# Patient Record
Sex: Female | Born: 1935
Health system: Southern US, Community
[De-identification: ages and names within clinical notes are randomized; demographics above are authoritative.]

## PROBLEM LIST (undated history)

## (undated) DIAGNOSIS — M1990 Unspecified osteoarthritis, unspecified site: Secondary | ICD-10-CM

## (undated) DIAGNOSIS — K5732 Diverticulitis of large intestine without perforation or abscess without bleeding: Secondary | ICD-10-CM

## (undated) DIAGNOSIS — G629 Polyneuropathy, unspecified: Secondary | ICD-10-CM

## (undated) DIAGNOSIS — M4800 Spinal stenosis, site unspecified: Secondary | ICD-10-CM

## (undated) DIAGNOSIS — N200 Calculus of kidney: Secondary | ICD-10-CM

## (undated) DIAGNOSIS — F32A Depression, unspecified: Secondary | ICD-10-CM

## (undated) DIAGNOSIS — K900 Celiac disease: Secondary | ICD-10-CM

## (undated) DIAGNOSIS — D649 Anemia, unspecified: Secondary | ICD-10-CM

## (undated) DIAGNOSIS — Z860100 Personal history of colon polyps, unspecified: Secondary | ICD-10-CM

## (undated) DIAGNOSIS — I358 Other nonrheumatic aortic valve disorders: Secondary | ICD-10-CM

## (undated) DIAGNOSIS — M8580 Other specified disorders of bone density and structure, unspecified site: Secondary | ICD-10-CM

## (undated) DIAGNOSIS — I10 Essential (primary) hypertension: Secondary | ICD-10-CM

## (undated) DIAGNOSIS — F329 Major depressive disorder, single episode, unspecified: Secondary | ICD-10-CM

## (undated) DIAGNOSIS — E039 Hypothyroidism, unspecified: Secondary | ICD-10-CM

## (undated) DIAGNOSIS — R011 Cardiac murmur, unspecified: Secondary | ICD-10-CM

## (undated) HISTORY — DX: Polyneuropathy, unspecified: G62.9

## (undated) HISTORY — DX: Cardiac murmur, unspecified: R01.1

## (undated) HISTORY — DX: Personal history of colonic polyps: Z86.010

## (undated) HISTORY — DX: Hypothyroidism, unspecified: E03.9

## (undated) HISTORY — DX: Other nonrheumatic aortic valve disorders: I35.8

## (undated) HISTORY — PX: HEMORRHOID SURGERY: SHX153

## (undated) HISTORY — DX: Personal history of colon polyps, unspecified: Z86.0100

## (undated) HISTORY — DX: Spinal stenosis, site unspecified: M48.00

## (undated) HISTORY — PX: CATARACT EXTRACTION: SUR2

## (undated) HISTORY — DX: Diverticulitis of large intestine without perforation or abscess without bleeding: K57.32

## (undated) HISTORY — DX: Anemia, unspecified: D64.9

## (undated) HISTORY — DX: Other specified disorders of bone density and structure, unspecified site: M85.80

---
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
## 1964-11-01 HISTORY — PX: APPENDECTOMY: SHX54

## 1964-11-01 HISTORY — PX: TUBAL LIGATION: SHX77

## 1981-11-01 HISTORY — PX: BREAST CYST ASPIRATION: SHX578

## 1996-11-01 HISTORY — PX: COLON BIOPSY: SHX1369

## 1999-01-09 ENCOUNTER — Ambulatory Visit (HOSPITAL_COMMUNITY): Admission: RE | Admit: 1999-01-09 | Discharge: 1999-01-09 | Payer: Self-pay | Admitting: Obstetrics & Gynecology

## 1999-01-09 ENCOUNTER — Encounter: Payer: Self-pay | Admitting: Obstetrics & Gynecology

## 1999-04-23 ENCOUNTER — Other Ambulatory Visit: Admission: RE | Admit: 1999-04-23 | Discharge: 1999-04-23 | Payer: Self-pay | Admitting: Obstetrics & Gynecology

## 2000-01-18 ENCOUNTER — Ambulatory Visit (HOSPITAL_COMMUNITY): Admission: RE | Admit: 2000-01-18 | Discharge: 2000-01-18 | Payer: Self-pay | Admitting: Obstetrics & Gynecology

## 2000-01-18 ENCOUNTER — Encounter: Payer: Self-pay | Admitting: Obstetrics & Gynecology

## 2000-05-17 ENCOUNTER — Other Ambulatory Visit: Admission: RE | Admit: 2000-05-17 | Discharge: 2000-05-17 | Payer: Self-pay | Admitting: Obstetrics & Gynecology

## 2001-03-22 ENCOUNTER — Ambulatory Visit (HOSPITAL_COMMUNITY): Admission: RE | Admit: 2001-03-22 | Discharge: 2001-03-22 | Payer: Self-pay | Admitting: Obstetrics & Gynecology

## 2001-03-22 ENCOUNTER — Encounter: Payer: Self-pay | Admitting: Obstetrics & Gynecology

## 2002-05-23 ENCOUNTER — Ambulatory Visit (HOSPITAL_COMMUNITY): Admission: RE | Admit: 2002-05-23 | Discharge: 2002-05-23 | Payer: Self-pay | Admitting: Obstetrics & Gynecology

## 2002-05-23 ENCOUNTER — Encounter: Payer: Self-pay | Admitting: Obstetrics & Gynecology

## 2003-02-28 ENCOUNTER — Ambulatory Visit (HOSPITAL_COMMUNITY): Admission: RE | Admit: 2003-02-28 | Discharge: 2003-02-28 | Payer: Self-pay | Admitting: Gastroenterology

## 2003-02-28 ENCOUNTER — Encounter (INDEPENDENT_AMBULATORY_CARE_PROVIDER_SITE_OTHER): Payer: Self-pay | Admitting: Specialist

## 2003-03-25 ENCOUNTER — Ambulatory Visit (HOSPITAL_COMMUNITY): Admission: RE | Admit: 2003-03-25 | Discharge: 2003-03-25 | Payer: Self-pay | Admitting: Specialist

## 2003-05-29 ENCOUNTER — Ambulatory Visit (HOSPITAL_COMMUNITY): Admission: RE | Admit: 2003-05-29 | Discharge: 2003-05-29 | Payer: Self-pay | Admitting: Specialist

## 2003-07-17 ENCOUNTER — Ambulatory Visit (HOSPITAL_COMMUNITY): Admission: RE | Admit: 2003-07-17 | Discharge: 2003-07-17 | Payer: Self-pay | Admitting: Obstetrics & Gynecology

## 2003-07-17 ENCOUNTER — Encounter: Payer: Self-pay | Admitting: Obstetrics & Gynecology

## 2003-09-02 ENCOUNTER — Other Ambulatory Visit: Admission: RE | Admit: 2003-09-02 | Discharge: 2003-09-02 | Payer: Self-pay | Admitting: Obstetrics & Gynecology

## 2004-08-04 ENCOUNTER — Ambulatory Visit (HOSPITAL_COMMUNITY): Admission: RE | Admit: 2004-08-04 | Discharge: 2004-08-04 | Payer: Self-pay | Admitting: Obstetrics & Gynecology

## 2004-09-03 ENCOUNTER — Ambulatory Visit: Payer: Self-pay | Admitting: Internal Medicine

## 2004-09-14 ENCOUNTER — Ambulatory Visit: Payer: Self-pay | Admitting: Internal Medicine

## 2004-12-31 ENCOUNTER — Ambulatory Visit: Payer: Self-pay | Admitting: Internal Medicine

## 2005-01-05 ENCOUNTER — Ambulatory Visit: Payer: Self-pay | Admitting: Internal Medicine

## 2005-01-20 ENCOUNTER — Ambulatory Visit: Payer: Self-pay

## 2005-02-05 ENCOUNTER — Ambulatory Visit: Payer: Self-pay | Admitting: Internal Medicine

## 2005-05-07 ENCOUNTER — Ambulatory Visit: Payer: Self-pay | Admitting: Internal Medicine

## 2005-10-27 ENCOUNTER — Ambulatory Visit (HOSPITAL_COMMUNITY): Admission: RE | Admit: 2005-10-27 | Discharge: 2005-10-27 | Payer: Self-pay | Admitting: Obstetrics & Gynecology

## 2005-11-01 HISTORY — PX: NOSE SURGERY: SHX723

## 2005-11-01 HISTORY — PX: KNEE ARTHROSCOPY: SHX127

## 2005-11-20 ENCOUNTER — Emergency Department (HOSPITAL_COMMUNITY): Admission: EM | Admit: 2005-11-20 | Discharge: 2005-11-21 | Payer: Self-pay | Admitting: Emergency Medicine

## 2005-11-25 ENCOUNTER — Encounter: Admission: RE | Admit: 2005-11-25 | Discharge: 2005-11-25 | Payer: Self-pay | Admitting: Otolaryngology

## 2005-11-26 ENCOUNTER — Ambulatory Visit (HOSPITAL_BASED_OUTPATIENT_CLINIC_OR_DEPARTMENT_OTHER): Admission: RE | Admit: 2005-11-26 | Discharge: 2005-11-26 | Payer: Self-pay | Admitting: Otolaryngology

## 2005-11-29 ENCOUNTER — Ambulatory Visit: Payer: Self-pay | Admitting: Internal Medicine

## 2005-12-13 ENCOUNTER — Ambulatory Visit: Payer: Self-pay | Admitting: Internal Medicine

## 2006-01-07 ENCOUNTER — Ambulatory Visit: Payer: Self-pay | Admitting: Internal Medicine

## 2006-02-08 ENCOUNTER — Ambulatory Visit: Payer: Self-pay | Admitting: Internal Medicine

## 2006-05-23 ENCOUNTER — Ambulatory Visit: Payer: Self-pay | Admitting: Internal Medicine

## 2006-06-02 ENCOUNTER — Ambulatory Visit: Payer: Self-pay | Admitting: Internal Medicine

## 2006-09-14 ENCOUNTER — Ambulatory Visit: Payer: Self-pay | Admitting: Internal Medicine

## 2006-09-28 ENCOUNTER — Ambulatory Visit (HOSPITAL_BASED_OUTPATIENT_CLINIC_OR_DEPARTMENT_OTHER): Admission: RE | Admit: 2006-09-28 | Discharge: 2006-09-28 | Payer: Self-pay | Admitting: Orthopedic Surgery

## 2006-10-03 ENCOUNTER — Encounter: Payer: Self-pay | Admitting: Internal Medicine

## 2006-10-12 ENCOUNTER — Ambulatory Visit: Payer: Self-pay | Admitting: Internal Medicine

## 2006-10-12 LAB — CONVERTED CEMR LAB: TSH: 5.74 microintl units/mL — ABNORMAL HIGH (ref 0.35–5.50)

## 2006-10-27 ENCOUNTER — Ambulatory Visit: Payer: Self-pay | Admitting: Internal Medicine

## 2006-11-16 ENCOUNTER — Ambulatory Visit (HOSPITAL_COMMUNITY): Admission: RE | Admit: 2006-11-16 | Discharge: 2006-11-16 | Payer: Self-pay | Admitting: Obstetrics & Gynecology

## 2007-01-26 DIAGNOSIS — R011 Cardiac murmur, unspecified: Secondary | ICD-10-CM | POA: Insufficient documentation

## 2007-01-26 DIAGNOSIS — K900 Celiac disease: Secondary | ICD-10-CM | POA: Insufficient documentation

## 2007-01-26 DIAGNOSIS — M899 Disorder of bone, unspecified: Secondary | ICD-10-CM | POA: Insufficient documentation

## 2007-01-26 DIAGNOSIS — M949 Disorder of cartilage, unspecified: Secondary | ICD-10-CM

## 2007-01-26 DIAGNOSIS — E538 Deficiency of other specified B group vitamins: Secondary | ICD-10-CM | POA: Insufficient documentation

## 2007-03-06 ENCOUNTER — Ambulatory Visit: Payer: Self-pay | Admitting: Internal Medicine

## 2007-03-06 LAB — CONVERTED CEMR LAB: TSH: 1.83 microintl units/mL (ref 0.35–5.50)

## 2007-03-16 ENCOUNTER — Ambulatory Visit: Payer: Self-pay | Admitting: Internal Medicine

## 2007-03-16 LAB — CONVERTED CEMR LAB: Vitamin B-12: 477 pg/mL (ref 211–911)

## 2007-06-08 ENCOUNTER — Ambulatory Visit: Payer: Self-pay | Admitting: Internal Medicine

## 2007-06-08 DIAGNOSIS — I10 Essential (primary) hypertension: Secondary | ICD-10-CM | POA: Insufficient documentation

## 2007-06-08 DIAGNOSIS — F411 Generalized anxiety disorder: Secondary | ICD-10-CM | POA: Insufficient documentation

## 2007-06-08 DIAGNOSIS — T887XXA Unspecified adverse effect of drug or medicament, initial encounter: Secondary | ICD-10-CM

## 2007-06-13 ENCOUNTER — Encounter (INDEPENDENT_AMBULATORY_CARE_PROVIDER_SITE_OTHER): Payer: Self-pay | Admitting: *Deleted

## 2007-06-13 LAB — CONVERTED CEMR LAB
AST: 17 units/L (ref 0–37)
BUN: 12 mg/dL (ref 6–23)
Basophils Relative: 0.5 % (ref 0.0–1.0)
HCT: 41.5 % (ref 36.0–46.0)
Hemoglobin: 14.3 g/dL (ref 12.0–15.0)
Lymphocytes Relative: 20.2 % (ref 12.0–46.0)
MCHC: 34.6 g/dL (ref 30.0–36.0)
MCV: 90.7 fL (ref 78.0–100.0)
Monocytes Absolute: 0.3 10*3/uL (ref 0.2–0.7)
Monocytes Relative: 5.6 % (ref 3.0–11.0)
Neutro Abs: 4.3 10*3/uL (ref 1.4–7.7)
RDW: 12.8 % (ref 11.5–14.6)
Vitamin B-12: 447 pg/mL (ref 211–911)

## 2007-06-16 ENCOUNTER — Telehealth (INDEPENDENT_AMBULATORY_CARE_PROVIDER_SITE_OTHER): Payer: Self-pay | Admitting: *Deleted

## 2007-06-26 ENCOUNTER — Ambulatory Visit: Payer: Self-pay | Admitting: Internal Medicine

## 2007-07-03 LAB — CONVERTED CEMR LAB
ALT: 16 units/L (ref 0–35)
AST: 16 units/L (ref 0–37)
HDL: 71.6 mg/dL (ref >39.0)
VLDL: 22 mg/dL (ref 0–40)

## 2007-07-04 ENCOUNTER — Encounter (INDEPENDENT_AMBULATORY_CARE_PROVIDER_SITE_OTHER): Payer: Self-pay | Admitting: *Deleted

## 2007-09-12 ENCOUNTER — Telehealth (INDEPENDENT_AMBULATORY_CARE_PROVIDER_SITE_OTHER): Payer: Self-pay | Admitting: *Deleted

## 2007-11-14 ENCOUNTER — Ambulatory Visit: Payer: Self-pay | Admitting: Internal Medicine

## 2007-11-20 LAB — CONVERTED CEMR LAB
Cholesterol: 214 mg/dL
Direct LDL: 113.1 mg/dL
HDL: 70.6 mg/dL
Total CHOL/HDL Ratio: 3
Triglycerides: 123 mg/dL
VLDL: 25 mg/dL

## 2007-11-23 ENCOUNTER — Ambulatory Visit (HOSPITAL_COMMUNITY): Admission: RE | Admit: 2007-11-23 | Discharge: 2007-11-23 | Payer: Self-pay | Admitting: Family Medicine

## 2007-11-30 ENCOUNTER — Encounter: Admission: RE | Admit: 2007-11-30 | Discharge: 2007-11-30 | Payer: Self-pay | Admitting: Family Medicine

## 2007-12-01 ENCOUNTER — Ambulatory Visit: Payer: Self-pay | Admitting: Internal Medicine

## 2007-12-01 DIAGNOSIS — R209 Unspecified disturbances of skin sensation: Secondary | ICD-10-CM | POA: Insufficient documentation

## 2007-12-01 DIAGNOSIS — E782 Mixed hyperlipidemia: Secondary | ICD-10-CM

## 2007-12-02 ENCOUNTER — Encounter: Payer: Self-pay | Admitting: Internal Medicine

## 2007-12-02 LAB — CONVERTED CEMR LAB: Hgb A1c MFr Bld: 5.2 % (ref 4.6–6.1)

## 2007-12-04 ENCOUNTER — Encounter (INDEPENDENT_AMBULATORY_CARE_PROVIDER_SITE_OTHER): Payer: Self-pay | Admitting: *Deleted

## 2007-12-06 ENCOUNTER — Ambulatory Visit: Payer: Self-pay

## 2007-12-06 ENCOUNTER — Encounter: Payer: Self-pay | Admitting: Internal Medicine

## 2007-12-19 ENCOUNTER — Telehealth: Payer: Self-pay | Admitting: Internal Medicine

## 2007-12-22 ENCOUNTER — Encounter (INDEPENDENT_AMBULATORY_CARE_PROVIDER_SITE_OTHER): Payer: Self-pay | Admitting: *Deleted

## 2008-01-11 ENCOUNTER — Encounter: Payer: Self-pay | Admitting: Internal Medicine

## 2008-01-12 ENCOUNTER — Telehealth (INDEPENDENT_AMBULATORY_CARE_PROVIDER_SITE_OTHER): Payer: Self-pay | Admitting: *Deleted

## 2008-04-11 ENCOUNTER — Telehealth (INDEPENDENT_AMBULATORY_CARE_PROVIDER_SITE_OTHER): Payer: Self-pay | Admitting: *Deleted

## 2008-04-29 ENCOUNTER — Ambulatory Visit: Payer: Self-pay | Admitting: Internal Medicine

## 2008-04-29 DIAGNOSIS — M2550 Pain in unspecified joint: Secondary | ICD-10-CM | POA: Insufficient documentation

## 2008-04-29 DIAGNOSIS — R609 Edema, unspecified: Secondary | ICD-10-CM

## 2008-05-04 LAB — CONVERTED CEMR LAB
Rhuematoid fact SerPl-aCnc: <20 intl units/mL — ABNORMAL LOW (ref 0.0–20.0)
Sed Rate: 10 mm/hr (ref 0–22)

## 2008-05-08 ENCOUNTER — Encounter (INDEPENDENT_AMBULATORY_CARE_PROVIDER_SITE_OTHER): Payer: Self-pay | Admitting: *Deleted

## 2008-07-31 ENCOUNTER — Ambulatory Visit: Payer: Self-pay | Admitting: Internal Medicine

## 2008-07-31 DIAGNOSIS — IMO0002 Reserved for concepts with insufficient information to code with codable children: Secondary | ICD-10-CM

## 2008-07-31 DIAGNOSIS — K5730 Diverticulosis of large intestine without perforation or abscess without bleeding: Secondary | ICD-10-CM | POA: Insufficient documentation

## 2008-08-02 ENCOUNTER — Telehealth (INDEPENDENT_AMBULATORY_CARE_PROVIDER_SITE_OTHER): Payer: Self-pay | Admitting: *Deleted

## 2008-08-07 ENCOUNTER — Telehealth (INDEPENDENT_AMBULATORY_CARE_PROVIDER_SITE_OTHER): Payer: Self-pay | Admitting: *Deleted

## 2008-08-08 ENCOUNTER — Encounter: Admission: RE | Admit: 2008-08-08 | Discharge: 2008-08-08 | Payer: Self-pay | Admitting: Internal Medicine

## 2008-08-09 ENCOUNTER — Telehealth (INDEPENDENT_AMBULATORY_CARE_PROVIDER_SITE_OTHER): Payer: Self-pay | Admitting: *Deleted

## 2008-08-09 ENCOUNTER — Encounter (INDEPENDENT_AMBULATORY_CARE_PROVIDER_SITE_OTHER): Payer: Self-pay | Admitting: *Deleted

## 2008-08-09 DIAGNOSIS — M48061 Spinal stenosis, lumbar region without neurogenic claudication: Secondary | ICD-10-CM

## 2008-08-09 LAB — CONVERTED CEMR LAB
Basophils Absolute: 0 10*3/uL (ref 0.0–0.1)
Basophils Relative: 0.6 % (ref 0.0–3.0)
Bilirubin, Direct: 0.1 mg/dL (ref 0.0–0.3)
CO2: 29 meq/L (ref 19–32)
Calcium: 9.3 mg/dL (ref 8.4–10.5)
Cholesterol: 205 mg/dL (ref 0–200)
Creatinine, Ser: 0.8 mg/dL (ref 0.4–1.2)
Eosinophils Relative: 2.5 % (ref 0.0–5.0)
GFR calc non Af Amer: 75 mL/min
HDL: 58 mg/dL (ref >39.0)
MCV: 91.9 fL (ref 78.0–100.0)
Monocytes Absolute: 0.3 10*3/uL (ref 0.1–1.0)
Neutro Abs: 3.2 10*3/uL (ref 1.4–7.7)
Neutrophils Relative %: 62.5 % (ref 43.0–77.0)
Platelets: 242 10*3/uL (ref 150–400)
RDW: 12.8 % (ref 11.5–14.6)
TSH: 2.48 microintl units/mL (ref 0.35–5.50)
Total Bilirubin: 0.8 mg/dL (ref 0.3–1.2)
Total CHOL/HDL Ratio: 3.5
Triglycerides: 137 mg/dL (ref 0–149)

## 2008-08-20 ENCOUNTER — Telehealth (INDEPENDENT_AMBULATORY_CARE_PROVIDER_SITE_OTHER): Payer: Self-pay | Admitting: *Deleted

## 2008-08-30 ENCOUNTER — Encounter: Payer: Self-pay | Admitting: Internal Medicine

## 2008-10-29 ENCOUNTER — Telehealth (INDEPENDENT_AMBULATORY_CARE_PROVIDER_SITE_OTHER): Payer: Self-pay | Admitting: *Deleted

## 2008-11-01 HISTORY — PX: POLYPECTOMY: SHX149

## 2008-11-01 HISTORY — PX: COLONOSCOPY: SHX174

## 2008-11-12 ENCOUNTER — Encounter: Payer: Self-pay | Admitting: Internal Medicine

## 2008-11-12 ENCOUNTER — Ambulatory Visit: Payer: Self-pay | Admitting: Internal Medicine

## 2008-11-25 ENCOUNTER — Encounter (INDEPENDENT_AMBULATORY_CARE_PROVIDER_SITE_OTHER): Payer: Self-pay | Admitting: *Deleted

## 2008-11-25 ENCOUNTER — Ambulatory Visit (HOSPITAL_COMMUNITY): Admission: RE | Admit: 2008-11-25 | Discharge: 2008-11-25 | Payer: Self-pay | Admitting: Internal Medicine

## 2008-12-31 ENCOUNTER — Encounter: Payer: Self-pay | Admitting: Internal Medicine

## 2009-08-12 ENCOUNTER — Telehealth (INDEPENDENT_AMBULATORY_CARE_PROVIDER_SITE_OTHER): Payer: Self-pay | Admitting: *Deleted

## 2009-08-28 ENCOUNTER — Telehealth (INDEPENDENT_AMBULATORY_CARE_PROVIDER_SITE_OTHER): Payer: Self-pay | Admitting: *Deleted

## 2009-09-08 ENCOUNTER — Ambulatory Visit: Payer: Self-pay | Admitting: Internal Medicine

## 2009-09-08 DIAGNOSIS — Z860100 Personal history of colon polyps, unspecified: Secondary | ICD-10-CM | POA: Insufficient documentation

## 2009-09-08 DIAGNOSIS — Z8719 Personal history of other diseases of the digestive system: Secondary | ICD-10-CM | POA: Insufficient documentation

## 2009-09-08 LAB — CONVERTED CEMR LAB
Bilirubin Urine: NEGATIVE
Blood in Urine, dipstick: NEGATIVE
Glucose, Urine, Semiquant: NEGATIVE
Ketones, urine, test strip: NEGATIVE
Protein, U semiquant: NEGATIVE

## 2009-09-11 ENCOUNTER — Ambulatory Visit: Payer: Self-pay | Admitting: Internal Medicine

## 2009-09-14 LAB — CONVERTED CEMR LAB
Eosinophils Relative: 2.6 % (ref 0.0–5.0)
HCT: 37.8 % (ref 36.0–46.0)
Hemoglobin: 12.6 g/dL (ref 12.0–15.0)
Lymphocytes Relative: 24 % (ref 12.0–46.0)
Lymphs Abs: 1.5 10*3/uL (ref 0.7–4.0)
MCV: 91.9 fL (ref 78.0–100.0)
Monocytes Absolute: 0.3 10*3/uL (ref 0.1–1.0)
Neutrophils Relative %: 68.9 % (ref 43.0–77.0)
Platelets: 347 10*3/uL (ref 150.0–400.0)
RBC: 4.11 M/uL (ref 3.87–5.11)
RDW: 12.6 % (ref 11.5–14.6)

## 2009-09-15 LAB — CONVERTED CEMR LAB
ALT: 35 units/L (ref 0–35)
AST: 23 units/L (ref 0–37)
Albumin: 4 g/dL (ref 3.5–5.2)
BUN: 11 mg/dL (ref 6–23)
CO2: 28 meq/L (ref 19–32)
Chloride: 105 meq/L (ref 96–112)
Creatinine, Ser: 0.7 mg/dL (ref 0.4–1.2)
Eosinophils Absolute: 0.1 10*3/uL (ref 0.0–0.7)
Eosinophils Relative: 2.8 % (ref 0.0–5.0)
GFR calc non Af Amer: 86.64 mL/min (ref >60)
HDL: 60.8 mg/dL (ref >39.00)
Hemoglobin: 13 g/dL (ref 12.0–15.0)
Lymphs Abs: 1.5 10*3/uL (ref 0.7–4.0)
MCV: 91.9 fL (ref 78.0–100.0)
Monocytes Relative: 7.1 % (ref 3.0–12.0)
Neutro Abs: 2.6 10*3/uL (ref 1.4–7.7)
RBC: 4.14 M/uL (ref 3.87–5.11)
TSH: 6.28 microintl units/mL — ABNORMAL HIGH (ref 0.35–5.50)
Total Bilirubin: 0.7 mg/dL (ref 0.3–1.2)
Total Protein: 7.1 g/dL (ref 6.0–8.3)
WBC: 4.5 10*3/uL (ref 4.5–10.5)

## 2009-09-18 ENCOUNTER — Ambulatory Visit: Payer: Self-pay | Admitting: Internal Medicine

## 2009-09-18 DIAGNOSIS — E039 Hypothyroidism, unspecified: Secondary | ICD-10-CM

## 2009-09-19 ENCOUNTER — Encounter (INDEPENDENT_AMBULATORY_CARE_PROVIDER_SITE_OTHER): Payer: Self-pay | Admitting: *Deleted

## 2009-09-19 LAB — CONVERTED CEMR LAB
Fecal Occult Blood: NEGATIVE
OCCULT 4: NEGATIVE
OCCULT 5: NEGATIVE

## 2009-10-14 ENCOUNTER — Encounter: Payer: Self-pay | Admitting: Internal Medicine

## 2009-12-22 ENCOUNTER — Ambulatory Visit: Payer: Self-pay | Admitting: Internal Medicine

## 2009-12-30 ENCOUNTER — Ambulatory Visit: Payer: Self-pay | Admitting: Internal Medicine

## 2009-12-30 LAB — CONVERTED CEMR LAB: LDL Goal: 100 mg/dL

## 2010-01-12 ENCOUNTER — Telehealth (INDEPENDENT_AMBULATORY_CARE_PROVIDER_SITE_OTHER): Payer: Self-pay | Admitting: *Deleted

## 2010-04-15 ENCOUNTER — Encounter: Payer: Self-pay | Admitting: Internal Medicine

## 2010-04-15 ENCOUNTER — Encounter: Admission: RE | Admit: 2010-04-15 | Discharge: 2010-04-15 | Payer: Self-pay | Admitting: Internal Medicine

## 2010-04-15 LAB — HM MAMMOGRAPHY: HM Mammogram: NEGATIVE

## 2010-04-24 ENCOUNTER — Telehealth (INDEPENDENT_AMBULATORY_CARE_PROVIDER_SITE_OTHER): Payer: Self-pay | Admitting: *Deleted

## 2010-05-10 ENCOUNTER — Encounter: Payer: Self-pay | Admitting: Internal Medicine

## 2010-05-22 ENCOUNTER — Ambulatory Visit: Payer: Self-pay | Admitting: Family Medicine

## 2010-05-22 ENCOUNTER — Encounter: Payer: Self-pay | Admitting: Internal Medicine

## 2010-05-22 ENCOUNTER — Ambulatory Visit (HOSPITAL_COMMUNITY): Admission: RE | Admit: 2010-05-22 | Discharge: 2010-05-22 | Payer: Self-pay | Admitting: Family Medicine

## 2010-05-22 DIAGNOSIS — R079 Chest pain, unspecified: Secondary | ICD-10-CM | POA: Insufficient documentation

## 2010-06-29 ENCOUNTER — Ambulatory Visit: Payer: Self-pay | Admitting: Internal Medicine

## 2010-06-30 LAB — CONVERTED CEMR LAB
ALT: 26 units/L (ref 0–35)
Bilirubin, Direct: 0.1 mg/dL (ref 0.0–0.3)
Cholesterol: 197 mg/dL (ref 0–200)
HDL: 65.1 mg/dL (ref >39.00)
LDL Cholesterol: 105 mg/dL — ABNORMAL HIGH (ref 0–99)
Total Bilirubin: 0.7 mg/dL (ref 0.3–1.2)
Total CHOL/HDL Ratio: 3
VLDL: 27 mg/dL (ref 0.0–40.0)

## 2010-07-09 ENCOUNTER — Ambulatory Visit: Payer: Self-pay | Admitting: Internal Medicine

## 2010-11-29 LAB — CONVERTED CEMR LAB
AST: 26 units/L (ref 0–37)
Alkaline Phosphatase: 59 units/L (ref 39–117)
Total Bilirubin: 0.5 mg/dL (ref 0.3–1.2)

## 2010-12-03 NOTE — Progress Notes (Signed)
Summary: Refill Request  Phone Note Refill Request Message from:  Pharmacy on [facility] Fax #: [phone]  Refills Requested: Medication #1:  ALENDRONATE SODIUM 70 MG  TABS 1 by mouth qwk   Dosage confirmed as above?Dosage Confirmed   Supply Requested: 1 month   Last Refilled: 12/15/2009 Next Appointment Scheduled: 9.1.11 Initial call taken by: Harold Barban,  January 12, 2010 8:49 AM    Prescriptions: ALENDRONATE SODIUM 70 MG  TABS (ALENDRONATE SODIUM) 1 by mouth qwk  #4 Each x 11   Entered by:   Shonna Chock   Authorized by:   Marga Melnick MD   Signed by:   Shonna Chock on 01/12/2010   Method used:   Electronically to        [facility]* (retail)       [address]       [place], [place]  [id number]       Ph: [phone]       Fax: [phone]   RxID:   [id number]

## 2010-12-03 NOTE — Progress Notes (Signed)
Summary: Medicare RX Change Request  Phone Note Outgoing Call Call back at Home Phone [phone]   Call placed by: Shonna Chock,  April 24, 2010 3:55 PM Summary of Call: Left message on machine for patient to return call when avaliable, Reason for call:  We received letter from medicare indicating changing patient's Synthroid could be cost saving to the patient and the medicare program.  **Dr.Hopper is ok with changing if the patient agree's with it and checking TSH in 8 weeks if med changed**  Shonna Chock  April 24, 2010 4:01 PM     [name]  April 24, 2010 3:57 PM   Follow-up for Phone Call        Patient called back to say she would like Brand Name ONLY  I indicated that on paper fax and sent it back to [facility] Follow-up by: Shonna Chock,  April 27, 2010 2:39 PM

## 2010-12-03 NOTE — Letter (Signed)
Summary: Call a Nurse  Call a Nurse   Imported By: Lanelle Bal 06/02/2010 11:30:17  _____________________________________________________________________  External Attachment:    Type:   Image     Comment:   External Document

## 2010-12-03 NOTE — Assessment & Plan Note (Signed)
Summary: 6 MONTH FOLLOWUP///SPH   Vital Signs:  Patient profile:   77 year old female Weight:      158.2 pounds Temp:     98.2 degrees F oral Pulse rate:   60 / minute Resp:     17 per minute BP sitting:   122 / 86  (left arm) Cuff size:   large  Vitals Entered By: Shonna Chock CMA (July 09, 2010 9:47 AM) CC: 6 month follow-up (discuss labs/copy given), Lipid Management   Primary Care Cardale Dorer:  Alfonse Flavors  CC:  6 month follow-up (discuss labs/copy given) and Lipid Management.  History of Present Illness: Hyperlipidemia Follow-Up      This is a 77 year old woman who presents for Hyperlipidemia follow-up.  The patient denies muscle aches, GI upset, abdominal pain, flushing, itching, constipation, diarrhea ( she  sees Dr Laural Benes for  Gluten intolerance), and fatigue.  The patient denies the following symptoms: chest pain/pressure, exercise intolerance, dypsnea, palpitations, syncope, and pedal edema.  Compliance with medications (by patient report) has been near 100%.  Dietary compliance has been excellent.  The patient reports exercising 3-4X per week.  Adjunctive measures currently used by the patient include fish oil supplements.   Lipids excellent ,copy given.  Lipid Management History:      Positive NCEP/ATP III risk factors include female age 67 years old or older and hypertension.  Negative NCEP/ATP III risk factors include no history of early menopause without estrogen hormone replacement, non-diabetic, HDL cholesterol greater than 60, no family history for ischemic heart disease, non-tobacco-user status, no ASHD (atherosclerotic heart disease), no prior stroke/TIA, no peripheral vascular disease, and no history of aortic aneurysm.     Current Medications (verified): 1)  Synthroid 112 Mcg  Tabs (Levothyroxine Sodium) .Marland Kitchen.. 1 By Mouth Once Daily Except 1& 1/2 On Weds 2)  Restasis 0.05 %  Emul (Cyclosporine) 3)  Vitamin B-12 Cr 1000 Mcg  Tbcr (Cyanocobalamin) .Marland Kitchen.. 1 Cc Im Qmonth 4)   Oscal 600mg  Plus D .... 2 Tabs Daily 5)  One-A-Day Womens  Tabs (Multiple Vitamins-Calcium) .Marland Kitchen.. 1 By Mouth Once Daily 6)  Alendronate Sodium 70 Mg  Tabs (Alendronate Sodium) .Marland Kitchen.. 1 By Mouth Qwk 7)  Amlodipine Besylate 10 Mg  Tabs (Amlodipine Besylate) .... 1/2 Qam 8)  Budeprion Xl 150 Mg  Tb24 (Bupropion Hcl) .Marland Kitchen.. 1 By Mouth Every Other Day 9)  Trazodone Hcl 100 Mg  Tabs (Trazodone Hcl) .Marland Kitchen.. 1-2 By Mouth At Bedtime As Needed 10)  Timolol Maleate 0.5 % Soln (Timolol Maleate) .Marland Kitchen.. 1 Drop in Each Eye Daily 11)  Cosamine Ds .Marland Kitchen.. 1 By Mouth Two Times A Day 12)  Crestor 20 Mg Tabs (Rosuvastatin Calcium) .... Daily As Directed 13)  Fish Oil Ultra 1000 Mg Caps (Omega-3 Fatty Acids) .... Take 1 Cap Two Times A Day 14)  Ultram 50 Mg Tabs (Tramadol Hcl) .Marland Kitchen.. 1 By Mouth Every 6 Hours As Needed  Allergies: 1)  ! Augmentin 2)  ! * Nickel  Past History:  Past Medical History: Osteopenia; Aortic valve murmur; Sprue/ Gluten Enteropathy Diverticulosis, colon Glaucoma Colonic polyps, hx of, 3mm, 12/2008, Dr Laural Benes Diverticulitis, hx of , ? X2 ( last 2004) Hyperlipidemia: NMR : LDL 118(1642/ 654),HDL 65, TG 129;LDL goal = < 100, ideally < 75.[place] Study LDL goal = < 160. Hypothyroidism Spinal Stenosis  Physical Exam  General:  well-nourished,appears younger than age; alert,appropriate and cooperative throughout examination Lungs:  Normal respiratory effort, chest expands symmetrically. Lungs are clear to auscultation,  no crackles or wheezes. Heart:  normal rate, regular rhythm, no gallop, no rub, no JVD, and grade  1/6 systolic murmur.   Pulses:  R and L carotid,radial,dorsalis pedis and posterior tibial pulses are full and equal bilaterally Extremities:  No clubbing, cyanosis, edema. Neurologic:  alert & oriented X3.   Psych:  Intelligent & focused   Impression & Recommendations:  Problem # 1:  HYPERLIPIDEMIA (ICD-272.2)  Her updated medication list for this problem includes:     Crestor 20 Mg Tabs (Rosuvastatin calcium) .Marland Kitchen... Daily as directed (1/2 pill  3 x weekly)  Complete Medication List: 1)  Synthroid 112 Mcg Tabs (Levothyroxine sodium) .Marland Kitchen.. 1 by mouth once daily except 1& 1/2 on weds 2)  Restasis 0.05 % Emul (Cyclosporine) 3)  Vitamin B-12 Cr 1000 Mcg Tbcr (Cyanocobalamin) .Marland Kitchen.. 1 cc im qmonth 4)  Oscal 600mg  Plus D  .... 2 tabs daily 5)  One-a-day Womens Tabs (Multiple vitamins-calcium) .Marland Kitchen.. 1 by mouth once daily 6)  Alendronate Sodium 70 Mg Tabs (Alendronate sodium) .Marland Kitchen.. 1 by mouth qwk 7)  Amlodipine Besylate 10 Mg Tabs (Amlodipine besylate) .... 1/2 qam 8)  Budeprion Xl 150 Mg Tb24 (Bupropion hcl) .Marland Kitchen.. 1 by mouth every other day 9)  Trazodone Hcl 100 Mg Tabs (Trazodone hcl) .Marland Kitchen.. 1-2 by mouth at bedtime as needed 10)  Timolol Maleate 0.5 % Soln (Timolol maleate) .Marland Kitchen.. 1 drop in each eye daily 11)  Cosamine Ds  .Marland KitchenMarland Kitchen. 1 by mouth two times a day 12)  Crestor 20 Mg Tabs (Rosuvastatin calcium) .... Daily as directed (1/2 3 x weekly) 13)  Fish Oil Ultra 1000 Mg Caps (Omega-3 fatty acids) .... Take 1 cap two times a day 14)  Ultram 50 Mg Tabs (Tramadol hcl) .Marland Kitchen.. 1 by mouth every 6 hours as needed  Other Orders: Prescription Created Electronically [phone])  Lipid Assessment/Plan:      Based on NCEP/ATP III, the patient's risk factor category is "0-1 risk factors".  The patient's lipid goals are as follows: Total cholesterol goal is 200; LDL cholesterol goal is 100; HDL cholesterol goal is 40; Triglyceride goal is 150.  Her LDL cholesterol goal has not been met.  Secondary causes for hyperlipidemia have been ruled out.  She has been counseled on adjunctive measures for lowering her cholesterol and has been provided with dietary instructions.    Patient Instructions: 1)  No change ; recheck lipids 12 months. Check TSH ,BMET ,vitamin D level in 6 months ( 401.9, 733.90,244.9) Prescriptions: BUDEPRION XL 150 MG  TB24 (BUPROPION HCL) 1 by mouth every other day  #30 x 5    Entered and Authorized by:   Marga Melnick MD   Signed by:   Marga Melnick MD on 07/09/2010   Method used:   Faxed to ...       [facility]* (retail)       [address]       [place], [place]  [id number]       Ph: [phone]       Fax: [phone]   RxID:   [id number]

## 2010-12-03 NOTE — Assessment & Plan Note (Signed)
Summary: pain left chest - twisted back then got pain/cbs   Vital Signs:  Patient profile:   77 year old female Weight:      158 pounds Temp:     98.8 degrees F oral Pulse rate:   68 / minute BP sitting:   140 / 78  (left arm)  Vitals Entered By: Jeremy Johann CMA (May 22, 2010 1:38 PM) CC: pain in chest increasing with movement, Back Pain   History of Present Illness:       This is a 77 year old woman who presents with Back Pain.  The symptoms began 5 days ago.  Pt turned funny on Monday and felt sharp pain under Left breast and side.  No relief with ibuprofen.  Pain radiated to L Upper abd.  The patient denies fever, chills, weakness, loss of sensation, fecal incontinence, urinary incontinence, urinary retention, dysuria, rest pain, inability to work, and inability to care for self.  The pain began at home.  The pain is made worse by lying down, standing or walking, and activity.  Risk factors for serious underlying conditions include history of osteoporosis.    Current Medications (verified): 1)  Synthroid 112 Mcg  Tabs (Levothyroxine Sodium) .Marland Kitchen.. 1 By Mouth Once Daily Except 1& 1/2 On Weds 2)  Restasis 0.05 %  Emul (Cyclosporine) 3)  Vitamin B-12 Cr 1000 Mcg  Tbcr (Cyanocobalamin) .Marland Kitchen.. 1 Cc Im Qmonth 4)  Oscal 600mg  Plus D .... 2 Tabs Daily 5)  One-A-Day Womens  Tabs (Multiple Vitamins-Calcium) .Marland Kitchen.. 1 By Mouth Once Daily 6)  Alendronate Sodium 70 Mg  Tabs (Alendronate Sodium) .Marland Kitchen.. 1 By Mouth Qwk 7)  Amlodipine Besylate 10 Mg  Tabs (Amlodipine Besylate) .... 1/2 Qam 8)  Budeprion Xl 150 Mg  Tb24 (Bupropion Hcl) .Marland Kitchen.. 1 By Mouth Every Other Day 9)  Trazodone Hcl 100 Mg  Tabs (Trazodone Hcl) .Marland Kitchen.. 1-2 By Mouth At Bedtime As Needed 10)  Timolol Maleate 0.5 % Soln (Timolol Maleate) .Marland Kitchen.. 1 Drop in Each Eye Daily 11)  Cosamine Ds .Marland Kitchen.. 1 By Mouth Two Times A Day 12)  Crestor 20 Mg Tabs (Rosuvastatin Calcium) .... Daily As Directed 13)  Fish Oil Ultra 1000 Mg Caps (Omega-3 Fatty Acids)  .... Take 1 Cap Two Times A Day  Allergies (verified): 1)  ! Augmentin 2)  ! * Nickel  Past History:  Past medical, surgical, family and social histories (including risk factors) reviewed for relevance to current acute and chronic problems.  Past Medical History: Reviewed history from 12/30/2009 and no changes required. Osteopenia; Aortic valve murmur; Sprue/ Gluten Enteropathy Diverticulosis, colon Glaucoma Colonic polyps, hx of, 3mm, 12/2008, Dr Laural Benes Diverticulitis, hx of , ? X2 ( last 2004) Hyperlipidemia: NMR : LDL 118(1642/ 654),HDL 65, TG 129;LDL goal = < 100, ideally < 75. Hypothyroidism Spinal Stenosis  Past Surgical History: Reviewed history from 09/18/2009 and no changes required. Endoscopy: gluten enteropathy based on villae changes @ biopsy, Dr Laural Benes Cataract extraction Hemorrhoidectomy Tubal ligation Colon polypectomy, 3 mm , due 2015  Family History: Reviewed history from 09/18/2009 and no changes required. Father:? health history  Mother: manic depressive; S/P ECT, MI @ 84 Siblings: none Maternal aunts & uncles had manic depression ( 1aunt & 3 uncles); Maternal FH CAD; Muncle MI pre 48   Social History: Reviewed history from 09/18/2009 and no changes required. Retired Alcohol use-yes: socially Regular exercise-no  Review of Systems      See HPI  Physical Exam  General:  Well-developed,well-nourished,in no  acute distress; alert,appropriate and cooperative throughout examination Neck:  No deformities, masses, or tenderness noted. Lungs:  Normal respiratory effort, chest expands symmetrically. Lungs are clear to auscultation, no crackles or wheezes. Heart:  normal rate and no murmur.   Abdomen:  soft and non-tender.   Msk:  L lower ant rib pain - crepitus Extremities:  No clubbing, cyanosis, edema, or deformity noted with normal full range of motion of all joints.   Psych:  Oriented X3 and normally interactive.     Impression &  Recommendations:  Problem # 1:  RIB PAIN, LEFT SIDED (ICD-786.50) ultram 50 mg 1 by mouth every 6hours as needed  Orders: T-Ribs Unilateral 2 Views (71100TC) T-2 View CXR (71020TC) EKG w/ Interpretation (93000)  Complete Medication List: 1)  Synthroid 112 Mcg Tabs (Levothyroxine sodium) .Marland Kitchen.. 1 by mouth once daily except 1& 1/2 on weds 2)  Restasis 0.05 % Emul (Cyclosporine) 3)  Vitamin B-12 Cr 1000 Mcg Tbcr (Cyanocobalamin) .Marland Kitchen.. 1 cc im qmonth 4)  Oscal 600mg  Plus D  .... 2 tabs daily 5)  One-a-day Womens Tabs (Multiple vitamins-calcium) .Marland Kitchen.. 1 by mouth once daily 6)  Alendronate Sodium 70 Mg Tabs (Alendronate sodium) .Marland Kitchen.. 1 by mouth qwk 7)  Amlodipine Besylate 10 Mg Tabs (Amlodipine besylate) .... 1/2 qam 8)  Budeprion Xl 150 Mg Tb24 (Bupropion hcl) .Marland Kitchen.. 1 by mouth every other day 9)  Trazodone Hcl 100 Mg Tabs (Trazodone hcl) .Marland Kitchen.. 1-2 by mouth at bedtime as needed 10)  Timolol Maleate 0.5 % Soln (Timolol maleate) .Marland Kitchen.. 1 drop in each eye daily 11)  Cosamine Ds  .Marland KitchenMarland Kitchen. 1 by mouth two times a day 12)  Crestor 20 Mg Tabs (Rosuvastatin calcium) .... Daily as directed 13)  Fish Oil Ultra 1000 Mg Caps (Omega-3 fatty acids) .... Take 1 cap two times a day 14)  Ultram 50 Mg Tabs (Tramadol hcl) .Marland Kitchen.. 1 by mouth every 6 hours as needed Prescriptions: ULTRAM 50 MG TABS (TRAMADOL HCL) 1 by mouth every 6 hours as needed  #30 x 0   Entered and Authorized by:   Loreen Freud DO   Signed by:   Loreen Freud DO on 05/22/2010   Method used:   Electronically to        [facility]* (retail)       [address]       [place], [place]  [id number]       Ph: [phone]       Fax: [phone]   RxID:   [id number]    EKG  Procedure date:  05/22/2010  Findings:      Normal sinus rhythm with rate of:  63

## 2010-12-03 NOTE — Assessment & Plan Note (Signed)
Summary: 3 MONTH OV//PH   Vital Signs:  Patient profile:   77 year old female Weight:      161.2 pounds Temp:     98.7 degrees F oral Pulse rate:   56 / minute Resp:     16 per minute BP sitting:   120 / 82  (left arm) Cuff size:   large  Vitals Entered By: Shonna Chock (December 30, 2009 3:34 PM) CC: Follow-up on labs (copy given), Lipid Management Comments REVIEWED MED LIST, PATIENT AGREED DOSE AND INSTRUCTION CORRECT    CC:  Follow-up on labs (copy given) and Lipid Management.  History of Present Illness: Labs on Crestor 20 mg once weekly  reviewed & risks discussed; LDL mildly elevated with protective HDL. LDL  goal = < 100. On gluten free diet; CVE as biking 3X/week & machines weekly w/o symptoms.  Lipid Management History:      Positive NCEP/ATP III risk factors include female age 43 years old or older and hypertension.  Negative NCEP/ATP III risk factors include no history of early menopause without estrogen hormone replacement, non-diabetic, HDL cholesterol greater than 60, no family history for ischemic heart disease, non-tobacco-user status, no ASHD (atherosclerotic heart disease), no prior stroke/TIA, no peripheral vascular disease, and no history of aortic aneurysm.     Allergies: 1)  ! Augmentin 2)  ! * Nickel  Past History:  Past Medical History: Osteopenia; Aortic valve murmur; Sprue/ Gluten Enteropathy Diverticulosis, colon Glaucoma Colonic polyps, hx of, 3mm, 12/2008, Dr Laural Benes Diverticulitis, hx of , ? X2 ( last 2004) Hyperlipidemia: NMR : LDL 118(1642/ 654),HDL 65, TG 129;LDL goal = < 100, ideally < 75. Hypothyroidism Spinal Stenosis  Review of Systems CV:  Denies chest pain or discomfort, leg cramps with exertion, palpitations, and shortness of breath with exertion.  Physical Exam  General:  well-nourished; alert,appropriate and cooperative throughout examination Heart:  normal rate, regular rhythm, no gallop, no rub, no JVD, and grade 1 /6 systolic  murmur.   Pulses:  R and L carotid,radial,dorsalis pedis and posterior tibial pulses are full and equal bilaterally Extremities:  No clubbing, cyanosis, edema.   Impression & Recommendations:  Problem # 1:  HYPERLIPIDEMIA (ICD-272.2)  The following medications were removed from the medication list:    Crestor 20 Mg Tabs (Rosuvastatin calcium) .Marland Kitchen... 1 by mouth once weekly Her updated medication list for this problem includes:    Crestor 20 Mg Tabs (Rosuvastatin calcium) .Marland Kitchen... Daily as directed  Complete Medication List: 1)  Synthroid 112 Mcg Tabs (Levothyroxine sodium) .Marland Kitchen.. 1 by mouth once daily except 1& 1/2 on weds 2)  Restasis 0.05 % Emul (Cyclosporine) 3)  Vitamin B-12 Cr 1000 Mcg Tbcr (Cyanocobalamin) .Marland Kitchen.. 1 cc im qmonth 4)  Oscal 600mg  Plus D  .... 2 tabs daily 5)  One-a-day Womens Tabs (Multiple vitamins-calcium) .Marland Kitchen.. 1 by mouth once daily 6)  Alendronate Sodium 70 Mg Tabs (Alendronate sodium) .Marland Kitchen.. 1 by mouth qwk 7)  Amlodipine Besylate 10 Mg Tabs (Amlodipine besylate) .... 1/2 qam 8)  Budeprion Xl 150 Mg Tb24 (Bupropion hcl) .Marland Kitchen.. 1 by mouth every other day 9)  Trazodone Hcl 100 Mg Tabs (Trazodone hcl) .Marland Kitchen.. 1-2 by mouth at bedtime as needed 10)  Timolol Maleate 0.5 % Soln (Timolol maleate) .Marland Kitchen.. 1 drop in each eye daily 11)  Cosamine Ds  .Marland KitchenMarland Kitchen. 1 by mouth two times a day 12)  Crestor 20 Mg Tabs (Rosuvastatin calcium) .... Daily as directed  Lipid Assessment/Plan:  Based on NCEP/ATP III, the patient's risk factor category is "0-1 risk factors".  The patient's lipid goals are as follows: Total cholesterol goal is 200; LDL cholesterol goal is 100; HDL cholesterol goal is 40; Triglyceride goal is 150.  Her LDL cholesterol goal has not been met.  Secondary causes for hyperlipidemia have been ruled out.  She has been counseled on adjunctive measures for lowering her cholesterol and has been provided with dietary instructions.    Patient Instructions: 1)  Crestor 20 mg 1/2 pill M,W, &  F. 2)  Please schedule a follow-up appointment in 6 months. 3)  Hepatic Panel ; 4)  Lipid Panel ;CPK Prescriptions: CRESTOR 20 MG TABS (ROSUVASTATIN CALCIUM) daily as directed  #30 x 5   Entered and Authorized by:   Marga Melnick MD   Signed by:   Marga Melnick MD on 12/30/2009   Method used:   Faxed to ...       [facility]* (retail)       [address]       [place], [place]  [address]       Ph: [phone]       Fax: [phone]   RxID:   [id number]

## 2010-12-04 NOTE — Medication Information (Signed)
Summary: Change Request for Synthroid/[facility]  Change Request for Synthroid/[facility]   Imported By: Lanelle Bal 05/11/2010 11:59:51  _____________________________________________________________________  External Attachment:    Type:   Image     Comment:   External Document

## 2011-01-12 ENCOUNTER — Telehealth (INDEPENDENT_AMBULATORY_CARE_PROVIDER_SITE_OTHER): Payer: Self-pay | Admitting: *Deleted

## 2011-01-19 NOTE — Progress Notes (Signed)
Summary: Amlodipine refill  Phone Note Refill Request Message from:  Fax from Pharmacy on January 12, 2011 2:10 PM  Refills Requested: Medication #1:  AMLODIPINE BESYLATE 10 MG  TABS 1/2 qam [facility], [address] [place], [place], [place]  [phone], fax - [phone]   qty - 30  Next Appointment Scheduled: none Initial call taken by: Jerolyn Shin,  January 12, 2011 2:13 PM    Prescriptions: AMLODIPINE BESYLATE 10 MG  TABS (AMLODIPINE BESYLATE) 1/2 qam  #30 Each x 5   Entered by:   Shonna Chock CMA   Authorized by:   Marga Melnick MD   Signed by:   Shonna Chock CMA on 01/12/2011   Method used:   Electronically to        [facility]* (retail)       [address]       [place], [place]  [id number]       Ph: [phone]       Fax: [phone]   RxID:   [id number]

## 2011-01-28 ENCOUNTER — Other Ambulatory Visit: Payer: Self-pay | Admitting: Internal Medicine

## 2011-01-28 MED ORDER — LEVOTHYROXINE SODIUM 112 MCG PO TABS: ORAL_TABLET | ORAL | Status: DC

## 2011-01-28 NOTE — Telephone Encounter (Signed)
TSH 244.9 

## 2011-01-29 ENCOUNTER — Other Ambulatory Visit: Payer: Self-pay | Admitting: Internal Medicine

## 2011-02-02 ENCOUNTER — Ambulatory Visit (INDEPENDENT_AMBULATORY_CARE_PROVIDER_SITE_OTHER): Payer: Medicare Other | Admitting: Internal Medicine

## 2011-02-02 ENCOUNTER — Encounter: Payer: Self-pay | Admitting: Internal Medicine

## 2011-02-02 VITALS — BP 126/86 | HR 74 | Temp 98.2°F | Wt 158.6 lb

## 2011-02-02 DIAGNOSIS — R350 Frequency of micturition: Secondary | ICD-10-CM

## 2011-02-02 DIAGNOSIS — R109 Unspecified abdominal pain: Secondary | ICD-10-CM | POA: Insufficient documentation

## 2011-02-02 LAB — POCT URINALYSIS DIPSTICK
Bilirubin, UA: NEGATIVE
Leukocytes, UA: NEGATIVE
Nitrite, UA: NEGATIVE
Protein, UA: NEGATIVE
pH, UA: 5

## 2011-02-02 MED ORDER — METRONIDAZOLE 500 MG PO TABS: 500.0000 mg | ORAL_TABLET | Freq: Three times a day (TID) | ORAL | Status: AC

## 2011-02-02 MED ORDER — CIPROFLOXACIN HCL 500 MG PO TABS: 500.0000 mg | ORAL_TABLET | Freq: Two times a day (BID) | ORAL | Status: AC

## 2011-02-02 NOTE — Assessment & Plan Note (Signed)
Suspect diverticulitis on clinical grounds Urinalysis negative She's allergic to Augmentin Plan: Rest, fluids, bland diet, antibiotics, urine culture. Definitely needs to go to the ER if fever or the pain localized on the right side. See instructions

## 2011-02-02 NOTE — Progress Notes (Signed)
  Subjective:    Patient ID: Krista Carlson, female    DOB: 12-29-1931, 77 y.o.   MRN: [id number]  HPI  Patient has on and off mild abdominal pain, yesterday the pain was severe, particularly last night. Pain was on and off, mostly at the left lower quadrant without radiation. She feels "pressure" like she needs to go to the bathroom but she had a very small amount of stools. Has not taken any medication specific for the symptoms. She wonders if she has diverticulitis, because the symptom resembles previous episodes of diverticulitis.  Past medical history reviewed Past surgical history: Reports a BTL, no other surgeries  Review of Systems No fever or chills No nausea, vomiting, blood in the stools. No cough or shortness of breath Her bowel movements aren't never loose, she always has to strain, straining in the last 2 days is slt  more than baseline. No dysuria or gross hematuria Appetite is slightly decreased for the last 48 hours    Objective:   Physical Exam  Constitutional: She appears well-developed and well-nourished. No distress.  HENT:  Head: Normocephalic and atraumatic.  Eyes: No scleral icterus.  Cardiovascular: Normal rate, regular rhythm and normal heart sounds.   Pulmonary/Chest: Effort normal and breath sounds normal. No respiratory distress. She has no wheezes. She has no rales.  Abdominal: Soft. Bowel sounds are normal.       Not distended, moderately tender at the left flank without mass or rebound. No CVA tenderness           Assessment & Plan:

## 2011-02-02 NOTE — Patient Instructions (Signed)
Rest, fluids, bland diet. Take antibiotics for 10 days If you have severe symptoms, high fever, the pain localized in the right side:  go to the emergency room

## 2011-02-03 LAB — CBC WITH DIFFERENTIAL/PLATELET
Basophils Absolute: 0 10*3/uL (ref 0.0–0.1)
Lymphocytes Relative: 29 % (ref 12.0–46.0)
Monocytes Relative: 8.2 % (ref 3.0–12.0)
Platelets: 258 10*3/uL (ref 150.0–400.0)
RDW: 13.6 % (ref 11.5–14.6)
WBC: 4.8 10*3/uL (ref 4.5–10.5)

## 2011-02-03 LAB — BASIC METABOLIC PANEL
BUN: 14 mg/dL (ref 6–23)
Chloride: 107 mEq/L (ref 96–112)
Creatinine, Ser: 0.6 mg/dL (ref 0.4–1.2)
Glucose, Bld: 92 mg/dL (ref 70–99)
Potassium: 4.1 mEq/L (ref 3.5–5.1)

## 2011-02-04 ENCOUNTER — Telehealth: Payer: Self-pay | Admitting: *Deleted

## 2011-02-04 NOTE — Telephone Encounter (Signed)
Message left for patient to return my call.  

## 2011-02-04 NOTE — Telephone Encounter (Signed)
Message copied by Army Fossa on Thu Feb 04, 2011  9:55 AM ------      Message from: Willow Ora      Created: Wed Feb 03, 2011  8:13 PM       Advise patient:      Krista Carlson is the same

## 2011-02-05 ENCOUNTER — Encounter: Payer: Self-pay | Admitting: Internal Medicine

## 2011-02-05 LAB — CULTURE, URINE COMPREHENSIVE

## 2011-02-08 ENCOUNTER — Ambulatory Visit (INDEPENDENT_AMBULATORY_CARE_PROVIDER_SITE_OTHER): Payer: Medicare Other | Admitting: Internal Medicine

## 2011-02-08 ENCOUNTER — Encounter: Payer: Self-pay | Admitting: Internal Medicine

## 2011-02-08 VITALS — BP 122/84 | HR 72 | Temp 98.0°F | Wt 158.8 lb

## 2011-02-08 DIAGNOSIS — E538 Deficiency of other specified B group vitamins: Secondary | ICD-10-CM

## 2011-02-08 DIAGNOSIS — K5730 Diverticulosis of large intestine without perforation or abscess without bleeding: Secondary | ICD-10-CM

## 2011-02-08 DIAGNOSIS — E039 Hypothyroidism, unspecified: Secondary | ICD-10-CM

## 2011-02-08 DIAGNOSIS — R1032 Left lower quadrant pain: Secondary | ICD-10-CM

## 2011-02-08 DIAGNOSIS — M899 Disorder of bone, unspecified: Secondary | ICD-10-CM

## 2011-02-08 DIAGNOSIS — M8580 Other specified disorders of bone density and structure, unspecified site: Secondary | ICD-10-CM

## 2011-02-08 DIAGNOSIS — G609 Hereditary and idiopathic neuropathy, unspecified: Secondary | ICD-10-CM

## 2011-02-08 DIAGNOSIS — G629 Polyneuropathy, unspecified: Secondary | ICD-10-CM

## 2011-02-08 MED ORDER — DICYCLOMINE HCL 10 MG PO CAPS: 10.0000 mg | ORAL_CAPSULE | Freq: Three times a day (TID) | ORAL | Status: AC

## 2011-02-08 NOTE — Progress Notes (Signed)
  Subjective:    Patient ID: Krista Carlson, female    DOB: Feb 13, 1932, 77 y.o.   MRN: [id number]  HPI Abdominal  Pain has recurred intermittently for months , but it exacerbated 02/01/2011.  Location: LLQ    Radiation: essentially no radiation  Severity: 0-8 Quality: sharp Duration: < 2 minutes  Better with: having BM Worse with: no specific trigger ; she is on Gluten free diet   Symptoms Nausea/Vomiting: no  Diarrhea: no, but small BMs 6-8 X/ day  Constipation: no  Melena/BRBPR: no  Hematemesis: no  Anorexia: no  Fever/Chills: no  Dysuria: no  Rash: no  Wt loss: no  EtOH use: yes, occasionally   NSAIDs/ASA: no  Vaginal bleeding: no  Past Surgeries: Tubal ligation;L direct hernia age 43. Last colonoscopy 2008: polyps.                                PMH : Diverticulitis ; Cipro & Flagyl since 04/03 with 20 % improvement . Labs reviewed ; all normal except 25,000 colonies of Viridans Strep on urine C&S. Strep sensitive to Cipro.     Review of Systems Patient reports no  Adenopathy,  persistant / recurrent hoarseness , swallowing issues, chest pain,recurrent cough, hemoptysis, dyspnea,significant heartburn ,GU symptoms ( hematuria pyuria, incontinence) ), Gyn symptoms(abnormal  bleeding , pain),  ,skin/hair /nail changes,abnormal bruising or bleeding.  Pain & burning in feet is chronic  in feet unchanged. Objective:   Physical Exam   Gen.: Healthy and well-nourished in appearance. Alert, appropriate and cooperative throughout exam.In no distress.  Eyes: No corneal or conjunctival inflammation noted. Pupils equal round reactive to light;  No icterus. accommodation.   Mouth: Oral mucosa and oropharynx reveal no lesions or exudates. Teeth in good repair. No erythema of pharynx. Lungs: Normal respiratory effort; chest expands symmetrically. Lungs are clear to auscultation without rales, wheezes, or increased work of breathing. Heart: Normal rate and rhythm. Normal S1 and S2. No gallop,  click, or rub. Grade 1/6 systolic  murmur. Abdomen: Bowel sounds normal; abdomen soft  But slightly tender LLQ . No masses, organomegaly or hernias noted.  Musculoskeletal/extremities: Neg SLR. No clubbing, cyanosis, edema, or deformity noted. Range of motion  normal .Tone & strength   Normal.Joints mild OA changes. Nail health good. Vascular: Carotid, radial artery, dorsalis pedis and dorsalis posterior tibial pulses are full and equal. No bruits present. Neurologic: Alert and oriented x3. Deep tendon reflexes symmetrical and normal. Skin: Intact without suspicious lesions or rashes.  Lymph: No cervical, axillary  lymphadenopathy present.  Psych: Mood and affect are normal; no evidence of anxiety or depression.            Assessment & Plan:  #1 Abdominal pain ; 20% improvement with meds. PMH of Diverticulitis. #2 Peripheral Neuropathy , chronic

## 2011-02-08 NOTE — Patient Instructions (Signed)
Use the pain meds as Rxed. Complete full course of Cipro & Flagyl . Low residue until pain has resolved.

## 2011-02-08 NOTE — Telephone Encounter (Signed)
Pt is aware.  

## 2011-02-09 LAB — TSH: TSH: 1.74 u[IU]/mL (ref 0.35–5.50)

## 2011-02-09 LAB — VITAMIN B12: Vitamin B-12: 483 pg/mL (ref 211–911)

## 2011-02-11 ENCOUNTER — Other Ambulatory Visit: Payer: Self-pay | Admitting: Internal Medicine

## 2011-02-16 ENCOUNTER — Other Ambulatory Visit: Payer: Self-pay | Admitting: Internal Medicine

## 2011-03-01 ENCOUNTER — Ambulatory Visit (INDEPENDENT_AMBULATORY_CARE_PROVIDER_SITE_OTHER): Payer: Medicare Other | Admitting: Internal Medicine

## 2011-03-01 ENCOUNTER — Encounter: Payer: Self-pay | Admitting: Internal Medicine

## 2011-03-01 DIAGNOSIS — K900 Celiac disease: Secondary | ICD-10-CM

## 2011-03-01 DIAGNOSIS — Z01818 Encounter for other preprocedural examination: Secondary | ICD-10-CM

## 2011-03-01 DIAGNOSIS — Z86010 Personal history of colonic polyps: Secondary | ICD-10-CM

## 2011-03-01 DIAGNOSIS — R1032 Left lower quadrant pain: Secondary | ICD-10-CM

## 2011-03-01 DIAGNOSIS — Z87442 Personal history of urinary calculi: Secondary | ICD-10-CM

## 2011-03-01 DIAGNOSIS — Z8719 Personal history of other diseases of the digestive system: Secondary | ICD-10-CM

## 2011-03-01 NOTE — Patient Instructions (Signed)
Please complete stool cards. 

## 2011-03-01 NOTE — Progress Notes (Signed)
  Subjective:    Patient ID: Krista Carlson, female    DOB: 1932-03-30, 77 y.o.   MRN: [id number]  HPI ABDOMINAL PAIN  Location: LLQ  Onset: severe pain 3 weeks ago ; UTI diagnosed . ?  & Cipro Rxed.  Radiation: no  Severity: up to a 4; up to 9  Three weeks ago Quality: sharp    Duration: , 1-2 minutes Better with: no treatment has helped  ; Bentyl ineffective Worse with: especially in am    Symptoms Nausea/Vomiting: no  Diarrhea: no, but frequent partially formed  Constipation: no  Melena/BRBPR: no  Hematemesis: no  Anorexia: no  Fever/Chills: no  Dysuria/ pyuria/hematuria: no  Rash: no  Wt loss: no  Vaginal bleeding: no    Past Surgeries: appendectomy with Tubal; L inguinal hernia repair @ age 81. FH: colon cancer among M  unlces & aunts. Last colonoscopy 2007 by Dr Laural Benes. PMH of Celiac Disease 1998, Diverticulosis & Diverticulitis.       Review of Systems last Gyn exam 2010 by Dr  Aldona Bar; no PMH of Gyn disease. PMH of renal calculi 2006; passed spontaneously. Frequncy X 3-4 years.   Objective:   Physical Exam General appearance is one of good health and nourishment w/o distress.  Eyes: No conjunctival inflammation or scleral icterus is present.  Oral exam: Dental hygiene is good; lips and gums are healthy appearing.There is no oropharyngeal erythema or exudate noted.   Heart:  Normal rate and regular rhythm. S1 and S2 normal without gallop,  click, rub or other extra sounds  . Grade 1.5 systolic murmur.   Lungs:Chest clear to auscultation; no wheezes, rhonchi,rales ,or rubs present.No increased work of breathing.   Abdomen: bowel sounds normal, soft but tender LLQ  without masses, organomegaly or hernias noted.  No guarding or rebound   Skin:Warm & dry.  Intact without suspicious lesions or rashes ; no jaundice, minimal  tenting  Lymphatic: No lymphadenopathy is noted about the head, neck, axilla, or inguinal areas.              Assessment & Plan:  #1 abdominal  pain, left lower quadrant.  #2 celiac disease, past medical history  #3 renal calculi past medical history  Plan: CBC and differential, urinalysis, stool cards.BUN, creat as prelude to  CT of the abdomen and pelvis.

## 2011-03-02 LAB — CBC WITH DIFFERENTIAL/PLATELET
Basophils Relative: 0.5 % (ref 0.0–3.0)
Eosinophils Relative: 5.5 % — ABNORMAL HIGH (ref 0.0–5.0)
HCT: 37.8 % (ref 36.0–46.0)
MCV: 91.3 fl (ref 78.0–100.0)
Monocytes Absolute: 0.2 10*3/uL (ref 0.1–1.0)
Monocytes Relative: 6.6 % (ref 3.0–12.0)
Neutrophils Relative %: 39.9 % — ABNORMAL LOW (ref 43.0–77.0)
Platelets: 307 10*3/uL (ref 150.0–400.0)
RBC: 4.13 Mil/uL (ref 3.87–5.11)
WBC: 3.2 10*3/uL — ABNORMAL LOW (ref 4.5–10.5)

## 2011-03-02 LAB — CREATININE, SERUM: Creatinine, Ser: 0.7 mg/dL (ref 0.4–1.2)

## 2011-03-02 LAB — BUN: BUN: 16 mg/dL (ref 6–23)

## 2011-03-15 ENCOUNTER — Ambulatory Visit (INDEPENDENT_AMBULATORY_CARE_PROVIDER_SITE_OTHER)
Admission: RE | Admit: 2011-03-15 | Discharge: 2011-03-15 | Disposition: A | Discharge status: Home / Self Care | Payer: Medicare Other | Source: Ambulatory Visit

## 2011-03-15 DIAGNOSIS — M8580 Other specified disorders of bone density and structure, unspecified site: Secondary | ICD-10-CM

## 2011-03-15 DIAGNOSIS — M949 Disorder of cartilage, unspecified: Secondary | ICD-10-CM

## 2011-03-15 DIAGNOSIS — M899 Disorder of bone, unspecified: Secondary | ICD-10-CM

## 2011-03-16 ENCOUNTER — Ambulatory Visit (INDEPENDENT_AMBULATORY_CARE_PROVIDER_SITE_OTHER)
Admission: RE | Admit: 2011-03-16 | Discharge: 2011-03-16 | Disposition: A | Discharge status: Home / Self Care | Payer: Medicare Other | Source: Ambulatory Visit | Attending: Internal Medicine | Admitting: Internal Medicine

## 2011-03-16 DIAGNOSIS — R1032 Left lower quadrant pain: Secondary | ICD-10-CM

## 2011-03-16 HISTORY — DX: Essential (primary) hypertension: I10

## 2011-03-16 MED ORDER — IOHEXOL 300 MG/ML  SOLN
100.0000 mL | Freq: Once | INTRAMUSCULAR | Status: AC | PRN
  Administered 2011-03-16: 100 mL via INTRAVENOUS

## 2011-03-16 NOTE — Assessment & Plan Note (Signed)
[facility]                        [facility] OFFICE NOTE   NAME:Krista Carlson, Krista Carlson NAME                         MRN:          [id number]  DATE:03/16/2007                            DOB:          December 04, 1931    Krista Carlson was seen Mar 16, 2007, for multiple concerns.   On May 6, she lifted a heavy cardboard box and felt a pull in the  right parasternal area.  The symptoms have persisted as a dull constant  pain. There has been no treatment.  She has been painting a condo as  well.   She does describe increased life stresses.  She has retired but finds  that she really misses the regiment of working, and she is considering  returning to work.   She is describing tingling and coldness in her feet.  This has been of  longstanding.  It has persisted despite taking vitamin B12 1000 mcg  monthly.  Her recent TSH was therapeutic at 1.83.   She denies any claudication symptoms or poorly-healing lesions of the  feet.   REVIEW OF SYSTEMS:  Her review of systems reveals that she did have a  flair of diverticulitis in March and was on metronidazole and Cipro.  The symptoms were severe but at this time they are quiescent.   Her medication list was reviewed.  She is intolerant or allergic to  Augmentin and nickel as well as eye drops for allergy.  She does take  SBE prophylaxis.   Her weight is down to 143, approximately an 8-pound loss since December.  She has no scleral icterus or jaundice.  Her skin is warm and dry.   She has no lymphadenopathy or organomegaly.  Abdomen is nontender.   Chest is clear.  She has a grade 1 systolic murmur.  The dorsalis pedis  pulses are decreased.  There are no ischemic changes of the feet, and  nail health is excellent.   There is no aortic aneurysm; no femoral bruits were noted.   Thyroid is normal to palpation.   A B12 level will be checked along with an A1c.  Amitriptyline 10 mg  daily will be recommended each evening.   This can be increased by 10 mg  each month in an attempt to control the symptoms.   Additionally, VDRL will be checked, although this is extremely unlikely  that this would be positive.   She does have classic costochondritis.  The pain could  be elicited by  pushing of the right costochondral margin.  Moist heat or heating pad or  Zostrix cream would be appropriate.  She can also take glucosamine.  She  should report fever or increasing pain.  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     Titus Dubin. Alwyn Ren, MD,FACP,FCCP     [facility]/MedQ  DD: 03/16/2007  DT: 03/16/2007  Job #: [id number]

## 2011-03-18 ENCOUNTER — Other Ambulatory Visit: Payer: Self-pay | Admitting: Internal Medicine

## 2011-03-19 NOTE — Op Note (Signed)
NAME:  Krista Carlson, Krista Carlson                            ACCOUNT NO.:  [id number]   MEDICAL RECORD NO.:  [id number]                   PATIENT TYPE:  AMB   LOCATION:  ENDO                                 FACILITY:  [facility]   PHYSICIAN:  Danise Edge, M.D.                DATE OF BIRTH:  10-09-1932   DATE OF PROCEDURE:  02/28/2003  DATE OF DISCHARGE:                                 OPERATIVE REPORT   REFERRING PHYSICIAN:  Dr. Titus Dubin. Hopper.   PROCEDURE INDICATION:  The patient is a 77 year old female, born June 03, 1932.  The patient has celiac sprue.  She has colonic diverticulosis and a  history of diverticulitis.   ENDOSCOPIST:  Danise Edge, M.D.   PREMEDICATION:  Versed 7 mg, Demerol 50 mg.   PROCEDURE:  Esophagogastroduodenoscopy with small bowel biopsies.   DESCRIPTION OF PROCEDURE:  After obtaining informed consent, the patient was  placed in the left lateral decubitus position.  I administered intravenous  Demerol and intravenous Versed to achieve conscious sedation for the  procedure.  The patient's  blood pressure, oxygen saturation and cardiac  rhythm were monitored throughout the procedure and documented in the medical  record.   The Olympus pediatric colonoscope was passed through the posterior  hypopharynx into the proximal esophagus without difficulty.  The  hypopharynx, larynx and vocal cords appeared normal.   ESOPHAGOSCOPY:  The proximal, mid and lower segments of the esophagus appear  normal.   GASTROSCOPY:  A retroflexed view of the gastric cardia and fundus was  normal.  The gastric body, antrum and pylorus appear normal.   DUODENOSCOPY:  The duodenal bulb, mid-duodenum, distal duodenum and proximal  jejunum appear normal.  Multiple biopsies were taken from the second-third  portions of the duodenum to evaluate the villous architecture, given her  diagnosis of celiac sprue.   PROCEDURE:  Proctocolonoscopy to the cecum.   DESCRIPTION OF PROCEDURE:   Anal inspection was normal.  Digital rectal exam  was normal.  The Olympus pediatric video colonoscope was introduced into the  rectum and advanced to the cecum.  Colonic preparation for the exam today  was excellent.   The patient does have universal colonic diverticulosis without  diverticulitis or diverticular stricture formation.   Rectum normal.  Sigmoid colon and descending colon normal.  Splenic flexure  normal.  Transverse colon:  A 1-mm sessile polyp was removed from the  transverse colon with the cold-biopsy forceps.  Hepatic flexure normal.  Ascending colon normal.  Cecum and ileocecal valve normal.    ASSESSMENT:  1. Universal colonic diverticulosis.  2. A 1-mm sessile polyp was removed from the proximal transverse colon.  Danise Edge, M.D.    MJ/MEDQ  D:  02/28/2003  T:  02/28/2003  Job:  [id number]   cc:   Titus Dubin. Alwyn Ren, M.D. [facility]

## 2011-03-19 NOTE — Op Note (Signed)
NAME:  SHELBEE, APGAR NO.:  [id number]   MEDICAL RECORD NO.:  [id number]          PATIENT TYPE:  AMB   LOCATION:  DSC                          FACILITY:  [facility]   PHYSICIAN:  Lucky Cowboy, MD         DATE OF BIRTH:  10/22/1932   DATE OF PROCEDURE:  11/26/2005  DATE OF DISCHARGE:                                 OPERATIVE REPORT   PREOPERATIVE DIAGNOSIS:  Closed nasal fracture.   POSTOPERATIVE DIAGNOSIS:  Closed nasal fracture.   PROCEDURE:  Close reduction nasal fracture.   SURGEON:  Lucky Cowboy, MD   ANESTHESIA:  General endotracheal anesthesia.   ESTIMATED BLOOD LOSS:  None.   COMPLICATIONS:  None.   INDICATIONS:  The patient is a 77 year old female who was helping her uncle  when she fell on her face with her hands in both of her pockets.  This  resulted in a comminuted nasal fracture with external deviation to the left.   DESCRIPTION OF PROCEDURE:  The patient was taken to the operating room and  placed on the table in the supine position.  She was then placed under  general endotracheal anesthesia and the  nasal cavities decongested with  Afrin on cottonoid pledget.  The external nasal dorsum was manually pushed  over to the right side with significant improvement in the nasal dorsum  appearance.  There was some depression in the right nasal bone which had to  be supported using a Kennedy pack coated with Bacitracin ointment. A Denver  splint was applied. The pack was secured underneath a Denver splint by  taping the string.  The patient was awakened from anesthesia and taken to  the Post Anesthesia Care Unit in stable condition. No complications.      Lucky Cowboy, MD  Electronically Signed     SJ/MEDQ  D:  11/26/2005  T:  11/27/2005  Job:  [id number]   cc:   [facility]

## 2011-03-19 NOTE — Op Note (Signed)
NAME:  Krista Carlson, Krista Carlson                  ACCOUNT NO.:  [id number]   MEDICAL RECORD NO.:  [id number]          PATIENT TYPE:  AMB   LOCATION:  DSC                          FACILITY:  [facility]   PHYSICIAN:  Mila Homer. Sherlean Foot, M.D. DATE OF BIRTH:  22-Aug-1932   DATE OF PROCEDURE:  09/28/2006  DATE OF DISCHARGE:                               OPERATIVE REPORT   SURGEON:  Mila Homer. Sherlean Foot, M.D.   ASSISTANT:  None.   ANESTHESIA:  MAC.   PREOPERATIVE DIAGNOSES:  1. Left knee osteoarthritis.  2. Medial meniscus tear.   POSTOPERATIVE DIAGNOSES:  1. Left knee osteoarthritis.  2. Medial meniscus tear.  3. Plica syndrome.   PROCEDURE:  Left knee arthroscopy with partial medial meniscectomy and  partial lateral meniscectomy, chondroplasty in all 3 compartments, and  plicectomy.   INDICATIONS FOR PROCEDURE:  The patient is a 77 year old mechanical  symptoms MRI evidence of meniscus tearing. Informed consent was  obtained.   DESCRIPTION OF PROCEDURE:  The patient was laid supine under MAC  anesthesia.  The left leg was prepped and draped in the usual sterile  fashion.  Inferolateral and inferomedial portals were created with a #11  blade, blunt trocar and cannula.  Diagnostic arthroscopy revealed grade  3 and 4 chondromalacia of the patellofemoral joint.  There was a very  large thick band of plica impinging in the patellofemoral joint.  This  was debrided with the 3.5 [place] shaver.  A chondroplasty was  performed in the patellofemoral compartment at this point as well.  We  then went into the medial compartment.  There was grade 3 chondromalacia  over much of the weightbearing surface of the medial femoral condyle.  Chondroplasty was performed.  There was a large tear of the posterior  horn of the medial meniscus.  A partial medial meniscectomy was  performed with straight and upbiting basket forceps and with [place]  shaver to remove the debris.  ACL appeared normal in the notch.   There  was an osteophyte in the notch.  I went into the figure-four position.  There was a posterior horn lateral meniscus tear, as well as some  degenerative anterior radial tearing.  This was debrided with the [facility] shaver.  Chondroplasty was performed on the tibial surface.  I  then lavaged the knee, closed with 4-0 nylon.  Dressed with Xeroform  dressing and sterile Webril and Ace wrap.   COMPLICATIONS:  None.   DRAINS:  None.           ______________________________  Mila Homer. Sherlean Foot, M.D.     SDL/MEDQ  D:  09/28/2006  T:  09/28/2006  Job:  [id number]

## 2011-03-31 ENCOUNTER — Encounter: Payer: Self-pay | Admitting: Internal Medicine

## 2011-07-08 ENCOUNTER — Encounter: Payer: Self-pay | Admitting: Family Medicine

## 2011-07-08 ENCOUNTER — Ambulatory Visit (HOSPITAL_BASED_OUTPATIENT_CLINIC_OR_DEPARTMENT_OTHER)
Admission: RE | Admit: 2011-07-08 | Discharge: 2011-07-08 | Disposition: A | Discharge status: Home / Self Care | Payer: Medicare Other | Source: Ambulatory Visit | Attending: Family Medicine | Admitting: Family Medicine

## 2011-07-08 ENCOUNTER — Ambulatory Visit (INDEPENDENT_AMBULATORY_CARE_PROVIDER_SITE_OTHER)
Admission: RE | Admit: 2011-07-08 | Discharge: 2011-07-08 | Disposition: A | Discharge status: Home / Self Care | Payer: Medicare Other | Source: Ambulatory Visit | Attending: Family Medicine | Admitting: Family Medicine

## 2011-07-08 ENCOUNTER — Ambulatory Visit (INDEPENDENT_AMBULATORY_CARE_PROVIDER_SITE_OTHER): Payer: Medicare Other | Admitting: Family Medicine

## 2011-07-08 VITALS — BP 142/88 | HR 70 | Temp 98.5°F | Wt 158.0 lb

## 2011-07-08 DIAGNOSIS — R0789 Other chest pain: Secondary | ICD-10-CM

## 2011-07-08 DIAGNOSIS — N644 Mastodynia: Secondary | ICD-10-CM

## 2011-07-08 DIAGNOSIS — R0781 Pleurodynia: Secondary | ICD-10-CM

## 2011-07-08 NOTE — Progress Notes (Signed)
  Subjective:    Patient ID: Krista Carlson, female    DOB: Aug 23, 1932, 77 y.o.   MRN: [id number]  HPI Pt here c/o pain under R breast for about 4 days.  She was cutting limbs in her yard last week but pain didn't start until Sunday.  Pt states pain occurs when she moves in anyway.  No cp no sob.    Review of Systems    as above Objective:   Physical Exam  Constitutional: She appears well-developed and well-nourished.  Cardiovascular: Normal rate and regular rhythm.   Pulmonary/Chest: Effort normal and breath sounds normal. Chest wall is not dull to percussion. She exhibits tenderness. She exhibits no mass, no bony tenderness, no laceration, no crepitus, no edema, no deformity, no swelling and no retraction. Right breast exhibits no inverted nipple, no mass, no nipple discharge and no skin change. Left breast exhibits no inverted nipple, no mass, no nipple discharge, no skin change and no tenderness. Breasts are symmetrical.            Assessment & Plan:  R breast / rib pain---- pt due for mammogram we will schedule diagnostic                                  Check cxr / rib films                                   Tramadol for pain                                      Call or rto prn

## 2011-07-16 ENCOUNTER — Ambulatory Visit
Admission: RE | Admit: 2011-07-16 | Discharge: 2011-07-16 | Disposition: A | Discharge status: Home / Self Care | Payer: Medicare Other | Source: Ambulatory Visit | Attending: Family Medicine | Admitting: Family Medicine

## 2011-07-16 ENCOUNTER — Other Ambulatory Visit: Payer: Self-pay | Admitting: Family Medicine

## 2011-07-16 DIAGNOSIS — N644 Mastodynia: Secondary | ICD-10-CM

## 2011-08-18 ENCOUNTER — Other Ambulatory Visit: Payer: Self-pay | Admitting: Internal Medicine

## 2011-08-26 ENCOUNTER — Other Ambulatory Visit: Payer: Self-pay | Admitting: Internal Medicine

## 2011-09-09 ENCOUNTER — Encounter (INDEPENDENT_AMBULATORY_CARE_PROVIDER_SITE_OTHER): Payer: Self-pay | Admitting: General Surgery

## 2011-09-10 ENCOUNTER — Ambulatory Visit (INDEPENDENT_AMBULATORY_CARE_PROVIDER_SITE_OTHER): Payer: Medicare Other | Admitting: General Surgery

## 2011-09-10 ENCOUNTER — Encounter (INDEPENDENT_AMBULATORY_CARE_PROVIDER_SITE_OTHER): Payer: Self-pay | Admitting: General Surgery

## 2011-09-10 VITALS — BP 130/94 | HR 60 | Temp 97.2°F | Resp 20 | Ht 60.0 in | Wt 149.1 lb

## 2011-09-10 DIAGNOSIS — K4090 Unilateral inguinal hernia, without obstruction or gangrene, not specified as recurrent: Secondary | ICD-10-CM

## 2011-09-10 NOTE — Progress Notes (Signed)
Chief Complaint  Patient presents with  . New Evaluation    eval of LIH     HPI Krista Carlson is a 77 y.o. female.  This patient is referred by Dr. Aldona Bar for evaluation of a left inguinal hernia. She has been having left lower quadrant abdominal pain for a year and has a history of diverticulosis and celiac disease but she is not found the cause for her symptoms. In May she had a CT scan of the abdomen which demonstrated a fat-containing left inguinal hernia but this was not communicated to her at the time. Yesterday she was in the shower and she noticed a "golf ball sized lump" in her left groin. She saw her doctor he yesterday for evaluation who noticed a left inguinal hernia on exam. This is not tender to palpation and is reducible. She has no obstructive symptoms and is current on her colonoscopy. She had a history of a right inguinal hernia repair when she was 77 years old. HPI  Past Medical History  Diagnosis Date  . Osteopenia   . Aortic heart murmur   . Diverticulitis of colon   . Glaucoma   . History of colonic polyps     3 mm, Dr Laural Benes  . Hypothyroidism   . Spinal stenosis   . Hypertension   . Heart murmur   . Anemia     Past Surgical History  Procedure Date  . Cataract extraction   . Hemorrhoid surgery   . Tubal ligation   . Polypectomy     due 2015, 3 mm  . Appendectomy   . Tonsillectomy   . Hernia repair 1943    Right Side  . Knee arthroscopy 2007    Left  . Nose surgery 2007    Broken Nose Reset    Family History  Problem Relation Age of Onset  . Depression      manic- Mother, maternal aunts and uncles   . Heart attack Maternal Uncle     Social History History  Substance Use Topics  . Smoking status: Former Games developer  . Smokeless tobacco: Never Used   Comment: off/on stopper age 77  . Alcohol Use: Yes     socially    Allergies  Allergen Reactions  . Augmentin   . Nickel     Redness and discomfort     Current Outpatient Prescriptions    Medication Sig Dispense Refill  . AMBULATORY NON FORMULARY MEDICATION Medication Name: Cosamine DS       . amLODipine (NORVASC) 10 MG tablet 5 mg. 1/2 tab every am      . buPROPion (WELLBUTRIN XL) 150 MG 24 hr tablet        . calcium carbonate (OS-CAL) 600 MG TABS Take 2 tablets by mouth daily.        . CRESTOR 20 MG tablet TAKE 1 TABLET ONCE A DAY AS DIRECTED.  30 each  3  . cyanocobalamin (,VITAMIN B-12,) 1000 MCG/ML injection Inject 1,000 mcg into the muscle once.        . cycloSPORINE (RESTASIS) 0.05 % ophthalmic emulsion Place 1 drop into both eyes daily.       . fish oil-omega-3 fatty acids 1000 MG capsule Take 2 g by mouth 2 (two) times daily.        Marland Kitchen FOSAMAX 70 MG tablet TAKE 1 TAB ONCE A WK AT LEAST 30 MIN BEFORE 1ST FOOD.DO NOT LIE DOWN FOR 30 MIN AFTER TAKING.  4 each  2  .  multivitamin (THERAGRAN) per tablet Take 1 tablet by mouth daily.        Marland Kitchen SYNTHROID 112 MCG tablet TAKE 1 TABLET ONCE DAILY,EXCEPT 1&1/2 TABLETS ONWEDNESDAY.  32 each  11  . timolol (BETIMOL) 0.5 % ophthalmic solution 1 drop daily.        . traMADol (ULTRAM) 50 MG tablet Take 50 mg by mouth every 6 (six) hours as needed.        . traZODone (DESYREL) 100 MG tablet 1-2 by mouth at bedtime as needed.         Review of Systems Review of Systems  Constitutional: Negative.   HENT: Negative.   Eyes: Negative.   Respiratory: Negative.   Cardiovascular: Negative.   Gastrointestinal: Positive for abdominal pain and abdominal distention.  Genitourinary: Negative.   Musculoskeletal: Negative.   Skin: Negative.   Neurological: Negative.   Hematological: Negative.   Psychiatric/Behavioral: Negative.   All other systems reviewed and are negative.    Blood pressure 130/94, pulse 60, temperature 97.2 F (36.2 C), temperature source Temporal, resp. rate 20, height 5' (1.524 m), weight 149 lb 2 oz (67.643 kg).  Physical Exam Physical Exam  Vitals reviewed. Constitutional: She is oriented to person, place, and  time. She appears well-developed and well-nourished. No distress.  HENT:  Head: Normocephalic and atraumatic.  Mouth/Throat: No oropharyngeal exudate.  Eyes: Conjunctivae are normal. Pupils are equal, round, and reactive to light. Right eye exhibits no discharge. Left eye exhibits no discharge. No scleral icterus.  Neck: Normal range of motion. Neck supple. No tracheal deviation present.  Cardiovascular: Normal rate and regular rhythm.   Pulmonary/Chest: Effort normal and breath sounds normal. No stridor. No respiratory distress. She has no wheezes.  Abdominal: Soft. Bowel sounds are normal. She exhibits no distension and no mass. There is no tenderness. There is no rebound and no guarding.       Her abdomen is soft and nontender on exam she has a lower transverse incision as well as our right inguinal incision consistent with a prior right inguinal hernia repair. She has a reducible left inguinal hernia in the left groin however this could also be a incisional hernia at this approach is her tubal ligation incision as well. Regardless, there is no evidence of incarceration or strangulation  Musculoskeletal: Normal range of motion. She exhibits no edema and no tenderness.  Neurological: She is alert and oriented to person, place, and time.  Skin: Skin is warm and dry. No rash noted. She is not diaphoretic. No erythema. No pallor.  Psychiatric: She has a normal mood and affect. Her behavior is normal. Judgment and thought content normal.    Data Reviewed CT 03/2011  Assessment    Reducible left inguinal hernia  She does have a reducible bulge in the left groin area which is likely a reducible left inguinal hernia however this approach as the edge of her tubal ligation scar and this could be an incisional hernia as well. Regardless, this is reducible on exam and is relatively asymptomatic at this time. I discussed with her the options of watchful waiting versus surgical repair. I think that given  her lower abdominal surgery, that open repair would probably be the best option for her. I think that she is interested in repair however, she would like to get back with me as far as scheduling her procedure. I did discuss with her the risks of procedure including infection, bleeding, pain, scarring, recurrence, nerve injury, persistent symptoms, and bowel injury  and chronic pain and she expressed understanding. We also discussed the signs and symptoms of incarceration and she understands that she needs to report to the emergency room as soon as possible if this were to occur.    Plan    She is going to think about the procedure and the options and she will call back if she is interested in scheduling operative repair. Meanwhile, she is to return to the emergency room if she has any signs of incarceration or strangulation.       Lodema Pilot DAVID 09/10/2011, 11:13 AM

## 2011-10-29 ENCOUNTER — Encounter (INDEPENDENT_AMBULATORY_CARE_PROVIDER_SITE_OTHER): Payer: Self-pay | Admitting: General Surgery

## 2011-10-29 ENCOUNTER — Ambulatory Visit (INDEPENDENT_AMBULATORY_CARE_PROVIDER_SITE_OTHER): Payer: Medicare Other | Admitting: General Surgery

## 2011-10-29 ENCOUNTER — Other Ambulatory Visit (INDEPENDENT_AMBULATORY_CARE_PROVIDER_SITE_OTHER): Payer: Self-pay | Admitting: General Surgery

## 2011-10-29 DIAGNOSIS — K4090 Unilateral inguinal hernia, without obstruction or gangrene, not specified as recurrent: Secondary | ICD-10-CM

## 2011-10-29 NOTE — Progress Notes (Signed)
Subjective:   Left groin lump, hernia  Patient ID: Krista Carlson, female   DOB: May 10, 1932, 77 y.o.   MRN: [id number]  HPI Patient returns to the office. She was recently diagnosed with a left internal hernia by Dr. Darylene Price. She has friends in the medical field who had recommended me and she asked if I would do the surgery. She noticed a tender golf ball size lump in her left groin last week in the shower. This has not recurred. She has had some somewhat chronic lower, or pain generalized over a long period of time and has been seen GYN urology for this without specific diagnosis. Past Medical History  Diagnosis Date  . Osteopenia   . Aortic heart murmur   . Diverticulitis of colon   . Glaucoma   . History of colonic polyps     3 mm, Dr Laural Benes  . Hypothyroidism   . Spinal stenosis   . Hypertension   . Heart murmur   . Anemia    Past Surgical History  Procedure Date  . Cataract extraction   . Hemorrhoid surgery   . Tubal ligation   . Polypectomy     due 2015, 3 mm  . Appendectomy   . Tonsillectomy   . Hernia repair 1943    Right Side  . Knee arthroscopy 2007    Left  . Nose surgery 2007    Broken Nose Reset   Current Outpatient Prescriptions  Medication Sig Dispense Refill  . AMBULATORY NON FORMULARY MEDICATION Medication Name: Cosamine DS       . amLODipine (NORVASC) 10 MG tablet 5 mg. 1/2 tab every am      . buPROPion (WELLBUTRIN XL) 150 MG 24 hr tablet        . calcium carbonate (OS-CAL) 600 MG TABS Take 2 tablets by mouth daily.        . Calcium Carbonate-Vitamin D (CALCIUM + D PO) Take by mouth 2 (two) times daily.        . cyanocobalamin (,VITAMIN B-12,) 1000 MCG/ML injection Inject 1,000 mcg into the muscle once.        . cycloSPORINE (RESTASIS) 0.05 % ophthalmic emulsion Place 1 drop into both eyes daily.       . fish oil-omega-3 fatty acids 1000 MG capsule Take 2 g by mouth 2 (two) times daily.        Marland Kitchen FOSAMAX 70 MG tablet TAKE 1 TAB ONCE A WK AT LEAST 30 MIN BEFORE  1ST FOOD.DO NOT LIE DOWN FOR 30 MIN AFTER TAKING.  4 each  2  . glucosamine-chondroitin 500-400 MG tablet Take 1 tablet by mouth 3 (three) times daily.        . multivitamin (THERAGRAN) per tablet Take 1 tablet by mouth daily.        . rosuvastatin (CRESTOR) 20 MG tablet        . SYNTHROID 112 MCG tablet TAKE 1 TABLET ONCE DAILY,EXCEPT 1&1/2 TABLETS ONWEDNESDAY.  32 each  11  . timolol (BETIMOL) 0.5 % ophthalmic solution 1 drop daily.        . traMADol (ULTRAM) 50 MG tablet Take 50 mg by mouth every 6 (six) hours as needed.        . traZODone (DESYREL) 100 MG tablet 1-2 by mouth at bedtime as needed.        Allergies  Allergen Reactions  . Augmentin   . Nickel     Redness and discomfort     Review of  Systems  Respiratory: Negative.   Cardiovascular: Negative.   Gastrointestinal: Positive for constipation.       Objective:   Physical Exam Gen.: Moderately obese pleasant Caucasian female Abdomen: In the left groin is a definite slightly tender reducible hernia which feels to be inguinal. Cannot rule out a lateral incisional hernia from her old BTL incision but I expect this is inguinal.    Assessment:     Left inguinal hernia. She has a remote history of right renal hernia. This needs to be repaired. I agree entirely with Dr. Delice Lesch evaluation and planned. She requested that I do the surgery due to her personal recommendation. We discussed open repair and its indications and nature as well as risks of anesthetic complications, bleeding, infection, recurrence, and chronic pain. We will schedule this at her convenience.    Plan:     Open left inguinal hernia repair with mesh under general anesthesia as an outpatient.

## 2011-11-02 HISTORY — PX: OTHER SURGICAL HISTORY: SHX169

## 2011-11-04 ENCOUNTER — Encounter (HOSPITAL_COMMUNITY): Payer: Self-pay | Admitting: Pharmacy Technician

## 2011-11-11 ENCOUNTER — Encounter (HOSPITAL_COMMUNITY)
Admission: RE | Admit: 2011-11-11 | Discharge: 2011-11-11 | Disposition: A | Discharge status: Home / Self Care | Payer: Medicare Other | Source: Ambulatory Visit | Attending: General Surgery | Admitting: General Surgery

## 2011-11-11 ENCOUNTER — Encounter: Payer: Self-pay | Admitting: Internal Medicine

## 2011-11-11 ENCOUNTER — Encounter (HOSPITAL_COMMUNITY): Payer: Self-pay

## 2011-11-11 ENCOUNTER — Ambulatory Visit (INDEPENDENT_AMBULATORY_CARE_PROVIDER_SITE_OTHER): Payer: Medicare Other | Admitting: Internal Medicine

## 2011-11-11 VITALS — BP 120/76 | Temp 98.4°F | Resp 12 | Ht 60.0 in | Wt 154.2 lb

## 2011-11-11 DIAGNOSIS — E785 Hyperlipidemia, unspecified: Secondary | ICD-10-CM

## 2011-11-11 DIAGNOSIS — E039 Hypothyroidism, unspecified: Secondary | ICD-10-CM

## 2011-11-11 DIAGNOSIS — Z0000 Encounter for general adult medical examination without abnormal findings: Secondary | ICD-10-CM

## 2011-11-11 DIAGNOSIS — R109 Unspecified abdominal pain: Secondary | ICD-10-CM | POA: Diagnosis not present

## 2011-11-11 DIAGNOSIS — Z87442 Personal history of urinary calculi: Secondary | ICD-10-CM

## 2011-11-11 DIAGNOSIS — E538 Deficiency of other specified B group vitamins: Secondary | ICD-10-CM | POA: Diagnosis not present

## 2011-11-11 DIAGNOSIS — D649 Anemia, unspecified: Secondary | ICD-10-CM | POA: Diagnosis not present

## 2011-11-11 DIAGNOSIS — I10 Essential (primary) hypertension: Secondary | ICD-10-CM

## 2011-11-11 DIAGNOSIS — Z79899 Other long term (current) drug therapy: Secondary | ICD-10-CM | POA: Diagnosis not present

## 2011-11-11 DIAGNOSIS — M899 Disorder of bone, unspecified: Secondary | ICD-10-CM

## 2011-11-11 DIAGNOSIS — K4090 Unilateral inguinal hernia, without obstruction or gangrene, not specified as recurrent: Secondary | ICD-10-CM | POA: Diagnosis not present

## 2011-11-11 DIAGNOSIS — Z01812 Encounter for preprocedural laboratory examination: Secondary | ICD-10-CM | POA: Diagnosis not present

## 2011-11-11 DIAGNOSIS — M949 Disorder of cartilage, unspecified: Secondary | ICD-10-CM

## 2011-11-11 HISTORY — DX: Calculus of kidney: N20.0

## 2011-11-11 HISTORY — DX: Celiac disease: K90.0

## 2011-11-11 HISTORY — DX: Major depressive disorder, single episode, unspecified: F32.9

## 2011-11-11 HISTORY — DX: Unspecified osteoarthritis, unspecified site: M19.90

## 2011-11-11 HISTORY — DX: Depression, unspecified: F32.A

## 2011-11-11 LAB — CBC WITH DIFFERENTIAL/PLATELET
Basophils Relative: 0.5 % (ref 0.0–3.0)
Eosinophils Relative: 2.1 % (ref 0.0–5.0)
HCT: 39.3 % (ref 36.0–46.0)
Hemoglobin: 13.6 g/dL (ref 12.0–15.0)
Lymphs Abs: 1.3 10*3/uL (ref 0.7–4.0)
MCV: 91.9 fl (ref 78.0–100.0)
Monocytes Relative: 6.4 % (ref 3.0–12.0)
Neutro Abs: 3.2 10*3/uL (ref 1.4–7.7)
RBC: 4.27 Mil/uL (ref 3.87–5.11)
WBC: 4.9 10*3/uL (ref 4.5–10.5)

## 2011-11-11 LAB — SURGICAL PCR SCREEN: MRSA, PCR: NEGATIVE

## 2011-11-11 LAB — HEPATIC FUNCTION PANEL
ALT: 25 U/L (ref 0–35)
AST: 20 U/L (ref 0–37)
Albumin: 4.2 g/dL (ref 3.5–5.2)
Total Bilirubin: 0.5 mg/dL (ref 0.3–1.2)
Total Protein: 7.1 g/dL (ref 6.0–8.3)

## 2011-11-11 LAB — BASIC METABOLIC PANEL
Chloride: 103 mEq/L (ref 96–112)
GFR: 118.75 mL/min (ref >60.00)
Potassium: 4.2 mEq/L (ref 3.5–5.1)
Sodium: 139 mEq/L (ref 135–145)

## 2011-11-11 LAB — VITAMIN B12: Vitamin B-12: 538 pg/mL (ref 211–911)

## 2011-11-11 LAB — LIPID PANEL
Cholesterol: 191 mg/dL (ref 0–200)
LDL Cholesterol: 96 mg/dL (ref 0–99)

## 2011-11-11 NOTE — Progress Notes (Signed)
Subjective:    Patient ID: Krista Carlson, female    DOB: 05-08-1932, 78 y.o.   MRN: [id number]  HPI Medicare Wellness Visit:  The following psychosocial & medical history were reviewed as required by Medicare.   Social history: caffeine: none , alcohol:  < 7 / week ,  tobacco use : quit 1986  & exercise : bike & gym 2X/ week 30-60 min.   Home & personal  safety / fall risk: no issues, activities of daily living: no limitations , seatbelt use : yes , and smoke alarm employment : yes .  Power of Attorney/Living Will status : in place  Vision ( as recorded per Nurse) & Hearing  evaluation :  Ophth exam 10/12: early glaucoma. Hearing exam at the hearing center was normal in December. Orientation :oriented X 3 , memory & recall :good,  math testing: good,and mood & affect : normal . Depression / anxiety: denied Travel history : last 2010, [place] , immunization status :Shingles needed , transfusion history:  no, and preventive health surveillance ( colonoscopies, BMD , etc as per protocol/ [facility]): Dr Laural Benes seen 2010, Dental care:  Seen every 6 months . Chart reviewed &  Updated. Active issues reviewed & addressed.      Review of Systems  HYPERTENSION: Disease Monitoring  Blood pressure range: not checked  Chest pain: no   Dyspnea: no   Claudication: no  Medication compliance: yes,   Medication Side Effects  Lightheadedness: no   Urinary frequency: yes;Dr Isabel Caprice, Urologist monitoring  Edema: no  Preventitive Healthcare:  Exercise: yes, see above   Diet Pattern: gluten free  Salt Restriction: no added salt  She has intermittent abdominal pain, chiefly in the left lower quadrant with radiation to back. She can also have pain in the right lower quadrant with back radiation. This is associated with frequent  loose stools. She denies unexplained weight loss, melena, or rectal bleeding. Her bleeding enteropathy presented as diarrhea. There are no definite triggers or relievers for her symptoms       Objective:   Physical Exam Gen.: Healthy and well-nourished in appearance. Alert, appropriate and cooperative throughout exam. Appears younger than stated age  Head: Normocephalic without obvious abnormalities  Eyes: No corneal or conjunctival inflammation noted.  Extraocular motion intact.  Ears: External  ear exam reveals no significant lesions or deformities. Canals clear .TMs normal.  Nose: External nasal exam reveals no deformity or inflammation. Nasal mucosa are pink and moist. No lesions or exudates noted.  Mouth: Oral mucosa and oropharynx reveal no lesions or exudates. Teeth in good repair. Neck: No deformities, masses, or tenderness noted. Range of motion & Thyroid normal. Lungs: Normal respiratory effort; chest expands symmetrically. Lungs are clear to auscultation without rales, wheezes, or increased work of breathing. Heart: Normal rate and rhythm. Normal S1 and S2. No gallop, click, or rub. Grade 1/6 systolic murmur  Abdomen: Bowel sounds normal; abdomen soft and nontender. No masses, organomegaly or hernias noted. Genitalia:Dr Wein   .                                                                                   Musculoskeletal/extremities: Mild  lordosis noted of  the thoracic  spine; asymmetry of the thoracic muscles suggests possible subclinical scoliosis. No clubbing, cyanosis, edema, or deformity noted. Range of motion  normal .Tone & strength  normal.Joints: Mixed arthritic changes in the hands. Nail health  good. Vascular: Carotid, radial artery, and  posterior tibial pulses are full and equal. Dorsalis pedis pulses are  decreased. No bruits present. Neurologic: Alert and oriented x3. Deep tendon reflexes symmetrical and normal.          Skin: Intact without suspicious lesions or rashes. Lymph: No cervical, axillary lymphadenopathy present. Psych: Mood and affect are normal. Normally interactive                                                                                           Assessment & Plan:  #1 Medicare Wellness Exam; criteria met ; data entered #2 Problem List reviewed ; Assessment/ Recommendations made Plan: see Orders

## 2011-11-11 NOTE — Assessment & Plan Note (Signed)
Vitamin D level be checked. Bone density should be done every 25 months

## 2011-11-11 NOTE — Assessment & Plan Note (Signed)
Blood pressure is well-controlled. Chemistries will be checked. EKG reveals bradycardia; no ischemic or hypertensive changes present

## 2011-11-11 NOTE — Assessment & Plan Note (Signed)
B12 level will be checked. Neuropathy symptoms persist

## 2011-11-11 NOTE — Patient Instructions (Signed)
Please complete stool cards Please take the probiotic , Align, every day until the bowels are normal. This will replace the normal bacteria which  are necessary for formation of normal stool and processing of food.

## 2011-11-11 NOTE — Assessment & Plan Note (Signed)
TSH will be checked. 

## 2011-11-11 NOTE — Patient Instructions (Addendum)
20 Ilene Witcher Heidenreich  11/11/2011   Your procedure is scheduled on:  11/16/11  Report to [facility] at 12:00 NOON   Call this number if you have problems the morning of surgery: [phone]   Remember:   Do not eat food:After Midnight.  May have clear liquids: up to 4 Hours before arrival. (8:00 AM)  Clear liquids include soda, tea, black coffee, apple or grape juice, broth.  Take these medicines the morning of surgery with A SIP OF WATER:NORVASC / SYNTHROID / CRESTOR / WELLBUTRIN / USE EYE DROPS AS USUAL   Do not wear jewelry, make-up or nail polish.  Do not wear lotions, powders, or perfumes. You may wear deodorant.  Do not shave 48 hours prior to surgery.  Do not bring valuables to the hospital.  Contacts, dentures or bridgework may not be worn into surgery.  Leave suitcase in the car. After surgery it may be brought to your room.  For patients admitted to the hospital, checkout time is 11:00 AM the day of discharge.   Patients discharged the day of surgery will not be allowed to drive home.  Name and phone number of your driver:   Special Instructions: CHG Shower Use Special Wash: 1/2 bottle night before surgery and 1/2 bottle morning of surgery.   Please read over the following fact sheets that you were given: MRSA Information NO ASPIRIN / HERBAL MEDICATIONS 7 DAYS PREOP

## 2011-11-11 NOTE — Assessment & Plan Note (Signed)
Abdominal pain is atypical and in varying locations, and both left lower and right lower quadrants without radiation. This is associated with loose stool. A trial of probiotic will be initiated. Stool cards will be collected. CBC and differential will be drawn. She should see Dr. Laural Benes if no better

## 2011-11-12 LAB — VITAMIN D 25 HYDROXY (VIT D DEFICIENCY, FRACTURES): Vit D, 25-Hydroxy: 73 ng/mL (ref 30–89)

## 2011-11-16 ENCOUNTER — Encounter (HOSPITAL_COMMUNITY): Payer: Self-pay | Admitting: Anesthesiology

## 2011-11-16 ENCOUNTER — Encounter (HOSPITAL_COMMUNITY): Payer: Self-pay | Admitting: *Deleted

## 2011-11-16 ENCOUNTER — Ambulatory Visit (HOSPITAL_COMMUNITY): Payer: Medicare Other | Admitting: Anesthesiology

## 2011-11-16 ENCOUNTER — Encounter (HOSPITAL_COMMUNITY)
Admission: RE | Disposition: A | Discharge status: Home / Self Care | Payer: Self-pay | Source: Ambulatory Visit | Attending: General Surgery

## 2011-11-16 ENCOUNTER — Ambulatory Visit (HOSPITAL_COMMUNITY)
Admission: RE | Admit: 2011-11-16 | Discharge: 2011-11-16 | Disposition: A | Discharge status: Home / Self Care | Payer: Medicare Other | Source: Ambulatory Visit | Attending: General Surgery | Admitting: General Surgery

## 2011-11-16 DIAGNOSIS — M899 Disorder of bone, unspecified: Secondary | ICD-10-CM | POA: Diagnosis not present

## 2011-11-16 DIAGNOSIS — I10 Essential (primary) hypertension: Secondary | ICD-10-CM | POA: Insufficient documentation

## 2011-11-16 DIAGNOSIS — Z01812 Encounter for preprocedural laboratory examination: Secondary | ICD-10-CM | POA: Insufficient documentation

## 2011-11-16 DIAGNOSIS — D649 Anemia, unspecified: Secondary | ICD-10-CM | POA: Insufficient documentation

## 2011-11-16 DIAGNOSIS — Z79899 Other long term (current) drug therapy: Secondary | ICD-10-CM | POA: Insufficient documentation

## 2011-11-16 DIAGNOSIS — K4090 Unilateral inguinal hernia, without obstruction or gangrene, not specified as recurrent: Secondary | ICD-10-CM | POA: Insufficient documentation

## 2011-11-16 DIAGNOSIS — E039 Hypothyroidism, unspecified: Secondary | ICD-10-CM | POA: Diagnosis not present

## 2011-11-16 DIAGNOSIS — M949 Disorder of cartilage, unspecified: Secondary | ICD-10-CM | POA: Insufficient documentation

## 2011-11-16 HISTORY — PX: INGUINAL HERNIA REPAIR: SHX194

## 2011-11-16 SURGERY — REPAIR, HERNIA, INGUINAL, ADULT
Anesthesia: General | Site: Groin | Laterality: Left | Wound class: Clean

## 2011-11-16 MED ORDER — DEXAMETHASONE SODIUM PHOSPHATE 10 MG/ML IJ SOLN
INTRAMUSCULAR | Status: DC | PRN
  Administered 2011-11-16: 10 mg via INTRAVENOUS

## 2011-11-16 MED ORDER — BUPIVACAINE LIPOSOME 1.3 % IJ SUSP
20.0000 mL | INTRAMUSCULAR | Status: AC
  Administered 2011-11-16: 20 mL
  Filled 2011-11-16: qty 20

## 2011-11-16 MED ORDER — CIPROFLOXACIN IN D5W 400 MG/200ML IV SOLN
400.0000 mg | INTRAVENOUS | Status: AC
  Administered 2011-11-16: 400 mg via INTRAVENOUS

## 2011-11-16 MED ORDER — PROMETHAZINE HCL 25 MG/ML IJ SOLN: 6.2500 mg | INTRAMUSCULAR | Status: DC | PRN

## 2011-11-16 MED ORDER — ACETAMINOPHEN 10 MG/ML IV SOLN
INTRAVENOUS | Status: AC
  Filled 2011-11-16: qty 100

## 2011-11-16 MED ORDER — CIPROFLOXACIN IN D5W 400 MG/200ML IV SOLN
INTRAVENOUS | Status: AC
  Filled 2011-11-16: qty 200

## 2011-11-16 MED ORDER — LIDOCAINE HCL (CARDIAC) 20 MG/ML IV SOLN
INTRAVENOUS | Status: DC | PRN
  Administered 2011-11-16: 100 mg via INTRAVENOUS

## 2011-11-16 MED ORDER — EPHEDRINE SULFATE 50 MG/ML IJ SOLN
INTRAMUSCULAR | Status: DC | PRN
  Administered 2011-11-16: 10 mg via INTRAVENOUS

## 2011-11-16 MED ORDER — GLYCOPYRROLATE 0.2 MG/ML IJ SOLN
INTRAMUSCULAR | Status: DC | PRN
  Administered 2011-11-16: .8 mg via INTRAVENOUS
  Administered 2011-11-16: 0.1 mg via INTRAVENOUS

## 2011-11-16 MED ORDER — PROPOFOL 10 MG/ML IV EMUL
INTRAVENOUS | Status: DC | PRN
  Administered 2011-11-16: 150 mg via INTRAVENOUS

## 2011-11-16 MED ORDER — OXYCODONE-ACETAMINOPHEN 5-325 MG PO TABS: 1.0000 | ORAL_TABLET | ORAL | Status: AC | PRN

## 2011-11-16 MED ORDER — NEOSTIGMINE METHYLSULFATE 1 MG/ML IJ SOLN
INTRAMUSCULAR | Status: DC | PRN
  Administered 2011-11-16: 5 mg via INTRAVENOUS

## 2011-11-16 MED ORDER — ACETAMINOPHEN 10 MG/ML IV SOLN
INTRAVENOUS | Status: DC | PRN
  Administered 2011-11-16: 1000 mg via INTRAVENOUS

## 2011-11-16 MED ORDER — FENTANYL CITRATE 0.05 MG/ML IJ SOLN: 25.0000 ug | INTRAMUSCULAR | Status: DC | PRN

## 2011-11-16 MED ORDER — ROCURONIUM BROMIDE 100 MG/10ML IV SOLN
INTRAVENOUS | Status: DC | PRN
  Administered 2011-11-16: 40 mg via INTRAVENOUS
  Administered 2011-11-16: 5 mg via INTRAVENOUS

## 2011-11-16 MED ORDER — OXYCODONE-ACETAMINOPHEN 5-325 MG PO TABS
ORAL_TABLET | ORAL | Status: AC
  Administered 2011-11-16: 1 via ORAL
  Filled 2011-11-16: qty 1

## 2011-11-16 MED ORDER — ONDANSETRON HCL 4 MG/2ML IJ SOLN
INTRAMUSCULAR | Status: DC | PRN
  Administered 2011-11-16: 4 mg via INTRAVENOUS

## 2011-11-16 MED ORDER — FENTANYL CITRATE 0.05 MG/ML IJ SOLN
INTRAMUSCULAR | Status: DC | PRN
  Administered 2011-11-16 (×5): 50 ug via INTRAVENOUS

## 2011-11-16 MED ORDER — LACTATED RINGERS IV SOLN
INTRAVENOUS | Status: DC
  Administered 2011-11-16: 1000 mL via INTRAVENOUS

## 2011-11-16 SURGICAL SUPPLY — 44 items
ADH SKN CLS APL DERMABOND .7 (GAUZE/BANDAGES/DRESSINGS)
BLADE HEX COATED 2.75 (ELECTRODE) ×2 IMPLANT
BLADE SURG 15 STRL LF DISP TIS (BLADE) ×1 IMPLANT
BLADE SURG 15 STRL SS (BLADE) ×2
BLADE SURG SZ10 CARB STEEL (BLADE) ×2 IMPLANT
CANISTER SUCTION 2500CC (MISCELLANEOUS) ×2 IMPLANT
CLOTH BEACON ORANGE TIMEOUT ST (SAFETY) ×2 IMPLANT
DECANTER SPIKE VIAL GLASS SM (MISCELLANEOUS) IMPLANT
DERMABOND ADVANCED (GAUZE/BANDAGES/DRESSINGS)
DERMABOND ADVANCED .7 DNX12 (GAUZE/BANDAGES/DRESSINGS) IMPLANT
DRAIN PENROSE 18X1/2 LTX STRL (DRAIN) ×2 IMPLANT
DRAPE INCISE IOBAN 66X45 STRL (DRAPES) ×2 IMPLANT
DRAPE LAPAROTOMY TRNSV 102X78 (DRAPE) ×2 IMPLANT
ELECT REM PT RETURN 9FT ADLT (ELECTROSURGICAL) ×2
ELECTRODE REM PT RTRN 9FT ADLT (ELECTROSURGICAL) ×1 IMPLANT
GLOVE BIOGEL PI IND STRL 7.0 (GLOVE) ×1 IMPLANT
GLOVE BIOGEL PI INDICATOR 7.0 (GLOVE) ×1
GOWN STRL NON-REIN LRG LVL3 (GOWN DISPOSABLE) ×2 IMPLANT
GOWN STRL REIN XL XLG (GOWN DISPOSABLE) ×4 IMPLANT
KIT BASIN OR (CUSTOM PROCEDURE TRAY) ×2 IMPLANT
MESH PARIETEX 5.2X3.6 (Mesh General) ×1 IMPLANT
NDL HYPO 25X1 1.5 SAFETY (NEEDLE) ×1 IMPLANT
NEEDLE HYPO 22GX1.5 SAFETY (NEEDLE) IMPLANT
NEEDLE HYPO 25X1 1.5 SAFETY (NEEDLE) ×2 IMPLANT
NS IRRIG 1000ML POUR BTL (IV SOLUTION) ×2 IMPLANT
PACK BASIC VI WITH GOWN DISP (CUSTOM PROCEDURE TRAY) ×2 IMPLANT
PENCIL BUTTON HOLSTER BLD 10FT (ELECTRODE) ×2 IMPLANT
SPONGE GAUZE 4X4 12PLY (GAUZE/BANDAGES/DRESSINGS) IMPLANT
SPONGE LAP 4X18 X RAY DECT (DISPOSABLE) ×2 IMPLANT
STRIP CLOSURE SKIN 1/2X4 (GAUZE/BANDAGES/DRESSINGS) IMPLANT
SUT MNCRL AB 4-0 PS2 18 (SUTURE) ×2 IMPLANT
SUT PROLENE 0 SH 30 (SUTURE) ×1 IMPLANT
SUT PROLENE 2 0 CT2 30 (SUTURE) ×4 IMPLANT
SUT SILK 2 0 SH (SUTURE) ×2 IMPLANT
SUT VIC AB 3-0 54XBRD REEL (SUTURE) ×1 IMPLANT
SUT VIC AB 3-0 BRD 54 (SUTURE) ×2
SUT VIC AB 3-0 CT1 27 (SUTURE) ×2
SUT VIC AB 3-0 CT1 TAPERPNT 27 (SUTURE) ×1 IMPLANT
SUT VIC AB 3-0 SH 27 (SUTURE) ×2
SUT VIC AB 3-0 SH 27XBRD (SUTURE) IMPLANT
SYR BULB IRRIGATION 50ML (SYRINGE) ×2 IMPLANT
SYR CONTROL 10ML LL (SYRINGE) ×2 IMPLANT
TOWEL OR 17X26 10 PK STRL BLUE (TOWEL DISPOSABLE) ×2 IMPLANT
YANKAUER SUCT BULB TIP 10FT TU (MISCELLANEOUS) ×2 IMPLANT

## 2011-11-16 NOTE — Transfer of Care (Signed)
Immediate Anesthesia Transfer of Care Note  Patient: Krista Carlson  Procedure(s) Performed:  HERNIA REPAIR INGUINAL ADULT - with mesh  Patient Location: PACU  Anesthesia Type: General  Level of Consciousness: sedated, patient cooperative and responds to stimulaton  Airway & Oxygen Therapy: Patient Spontanous Breathing and Patient connected to face mask oxgen  Post-op Assessment: Report given to PACU RN and Post -op Vital signs reviewed and stable  Post vital signs: Reviewed and stable  Complications: No apparent anesthesia complications

## 2011-11-16 NOTE — Anesthesia Postprocedure Evaluation (Signed)
  Anesthesia Post-op Note  Patient: Krista Carlson  Procedure(s) Performed:  HERNIA REPAIR INGUINAL ADULT - with mesh  Patient Location: PACU  Anesthesia Type: General  Level of Consciousness: awake and alert   Airway and Oxygen Therapy: Patient Spontanous Breathing  Post-op Pain: mild  Post-op Assessment: Post-op Vital signs reviewed, Patient's Cardiovascular Status Stable, Respiratory Function Stable, Patent Airway and No signs of Nausea or vomiting  Post-op Vital Signs: stable  Complications: No apparent anesthesia complications

## 2011-11-16 NOTE — Interval H&P Note (Signed)
History and Physical Interval Note:  11/16/2011 5:45 PM  Krista Carlson  has presented today for surgery, with the diagnosis of left ingunial hernia  The various methods of treatment have been discussed with the patient and family. After consideration of risks, benefits and other options for treatment, the patient has consented to  Procedure(s): HERNIA REPAIR INGUINAL ADULT as a surgical intervention .  The patients' history has been reviewed, patient examined, no change in status, stable for surgery.  I have reviewed the patients' chart and labs.  Questions were answered to the patient's satisfaction.     Jery Hollern T

## 2011-11-16 NOTE — Op Note (Signed)
Preoperative Diagnosis: left ingunial hernia  Postoprative Diagnosis: left ingunial hernia  Procedure: Procedure(s): HERNIA REPAIR INGUINAL ADULT   Surgeon: Glenna Fellows T   Assistants: none  Anesthesia:  General endotracheal anesthesiaDiagnos  Procedure Detail: Patient was brought to the operating room, placed in the supine position on the operating table and general endotracheal anesthesia induced. The left groin was widely sterilely prepped and draped. She received preoperative IV antibiotics. Patient timeout was performed the correct procedure confirmed. The patient had a previous Pfannenstiel incision and I extended an oblique incision in the left groin off the end of this. Dissection was carried down through the subcutaneous tissue with cautery. Scarpa's fascia was divided. The dissection was deepened down to the external oblique. It was opened along the lines of its fibers laterally and extended medially. Medially there was scarring and fusion of the muscle layers from the previous Pfannenstiel incision and this was opened down through the external ring. There was an obvious internal hernia present with a dilated ring and hernia sac. The round ligament was divided between Antelope clamps at the pubic tubercle and tied with 3-0 Vicryl. The round ligament and hernia sac were then completely mobilized up to the dilated internal ring. The round ligament was separated from the hernia sac at the internal ring and clamped divided and tied with 3-0 Vicryl. The hernia sac was opened and did not contain any bowel or sliding component. The hernia sac was suture ligated at the internal ring with 0 Prolene and was divided and removed. I then closed the internal ring with a running 2-0 Prolene suture. A piece of Parietex mesh was trimmed to size to fit the floor of the inguinal canal and extended well back laterally over the internal ring. It was sutured initially to the pubic tubercle and then to the  inguinal ligament with running 2-0 Prolene working well back lateral to the internal ring. Medially the mesh was sutured to the anterior rectus sheath with interrupted Prolenes. This provided nice broad coverage of the entire inguinal canal. Exparel local anesthetic was infiltrated into the fascial layers and subcutaneous tissue. The external oblique was closed over the mesh with running 3-0 Vicryl. The subcutaneous was closed with running 3-0 Vicryl and the skin closed with subcuticular 4-0 Monocryl and Dermabond. Sponge needle and instrument counts were correct appear   Findings: Indirect inguinal hernia  Estimated Blood Loss:  Minimal         Drains: none   Blood Given: none          Specimens: none        Complications:  * No complications entered in OR log *         Disposition: PACU - hemodynamically stable.         Condition: stable  Mariella Saa MD, FACS  11/16/2011, 7:27 PM

## 2011-11-16 NOTE — H&P (View-Only) (Signed)
Subjective:   Left groin lump, hernia  Patient ID: Krista Carlson, female   DOB: 01/27/1932, 78 y.o.   MRN: [id number]  HPI Patient returns to the office. She was recently diagnosed with a left internal hernia by Dr. Leyton. She has friends in the medical field who had recommended me and she asked if I would do the surgery. She noticed a tender golf ball size lump in her left groin last week in the shower. This has not recurred. She has had some somewhat chronic lower, or pain generalized over a long period of time and has been seen GYN urology for this without specific diagnosis. Past Medical History  Diagnosis Date  . Osteopenia   . Aortic heart murmur   . Diverticulitis of colon   . Glaucoma   . History of colonic polyps     3 mm, Dr Johnson  . Hypothyroidism   . Spinal stenosis   . Hypertension   . Heart murmur   . Anemia    Past Surgical History  Procedure Date  . Cataract extraction   . Hemorrhoid surgery   . Tubal ligation   . Polypectomy     due 2015, 3 mm  . Appendectomy   . Tonsillectomy   . Hernia repair 1943    Right Side  . Knee arthroscopy 2007    Left  . Nose surgery 2007    Broken Nose Reset   Current Outpatient Prescriptions  Medication Sig Dispense Refill  . AMBULATORY NON FORMULARY MEDICATION Medication Name: Cosamine DS       . amLODipine (NORVASC) 10 MG tablet 5 mg. 1/2 tab every am      . buPROPion (WELLBUTRIN XL) 150 MG 24 hr tablet        . calcium carbonate (OS-CAL) 600 MG TABS Take 2 tablets by mouth daily.        . Calcium Carbonate-Vitamin D (CALCIUM + D PO) Take by mouth 2 (two) times daily.        . cyanocobalamin (,VITAMIN B-12,) 1000 MCG/ML injection Inject 1,000 mcg into the muscle once.        . cycloSPORINE (RESTASIS) 0.05 % ophthalmic emulsion Place 1 drop into both eyes daily.       . fish oil-omega-3 fatty acids 1000 MG capsule Take 2 g by mouth 2 (two) times daily.        . FOSAMAX 70 MG tablet TAKE 1 TAB ONCE A WK AT LEAST 30 MIN BEFORE  1ST FOOD.DO NOT LIE DOWN FOR 30 MIN AFTER TAKING.  4 each  2  . glucosamine-chondroitin 500-400 MG tablet Take 1 tablet by mouth 3 (three) times daily.        . multivitamin (THERAGRAN) per tablet Take 1 tablet by mouth daily.        . rosuvastatin (CRESTOR) 20 MG tablet        . SYNTHROID 112 MCG tablet TAKE 1 TABLET ONCE DAILY,EXCEPT 1&1/2 TABLETS ONWEDNESDAY.  32 each  11  . timolol (BETIMOL) 0.5 % ophthalmic solution 1 drop daily.        . traMADol (ULTRAM) 50 MG tablet Take 50 mg by mouth every 6 (six) hours as needed.        . traZODone (DESYREL) 100 MG tablet 1-2 by mouth at bedtime as needed.        Allergies  Allergen Reactions  . Augmentin   . Nickel     Redness and discomfort     Review of   Systems  Respiratory: Negative.   Cardiovascular: Negative.   Gastrointestinal: Positive for constipation.       Objective:   Physical Exam Gen.: Moderately obese pleasant Caucasian female Abdomen: In the left groin is a definite slightly tender reducible hernia which feels to be inguinal. Cannot rule out a lateral incisional hernia from her old BTL incision but I expect this is inguinal.    Assessment:     Left inguinal hernia. She has a remote history of right renal hernia. This needs to be repaired. I agree entirely with Dr. Layton's evaluation and planned. She requested that I do the surgery due to her personal recommendation. We discussed open repair and its indications and nature as well as risks of anesthetic complications, bleeding, infection, recurrence, and chronic pain. We will schedule this at her convenience.    Plan:     Open left inguinal hernia repair with mesh under general anesthesia as an outpatient.      

## 2011-11-16 NOTE — Anesthesia Preprocedure Evaluation (Signed)
Anesthesia Evaluation  Patient identified by MRN, date of birth, ID band Patient awake    Reviewed: Allergy & Precautions, H&P , NPO status , Patient's Chart, lab work & pertinent test results  Airway Mallampati: II TM Distance: >3 FB Neck ROM: Full    Dental No notable dental hx.    Pulmonary neg pulmonary ROS,  clear to auscultation  Pulmonary exam normal       Cardiovascular hypertension, Pt. on medications neg cardio ROS + Valvular Problems/Murmurs Regular Normal    Neuro/Psych PSYCHIATRIC DISORDERS Anxiety Depression  Neuromuscular disease Negative Neurological ROS  Negative Psych ROS   GI/Hepatic negative GI ROS, Neg liver ROS,   Endo/Other  Negative Endocrine ROSHypothyroidism   Renal/GU negative Renal ROS  Genitourinary negative   Musculoskeletal negative musculoskeletal ROS (+)   Abdominal   Peds negative pediatric ROS (+)  Hematology negative hematology ROS (+)   Anesthesia Other Findings   Reproductive/Obstetrics negative OB ROS                           Anesthesia Physical Anesthesia Plan  ASA: III  Anesthesia Plan: General   Post-op Pain Management:    Induction: Intravenous  Airway Management Planned: LMA  Additional Equipment:   Intra-op Plan:   Post-operative Plan: Extubation in OR  Informed Consent: I have reviewed the patients History and Physical, chart, labs and discussed the procedure including the risks, benefits and alternatives for the proposed anesthesia with the patient or authorized representative who has indicated his/her understanding and acceptance.   Dental advisory given  Plan Discussed with: CRNA  Anesthesia Plan Comments:         Anesthesia Quick Evaluation

## 2011-11-18 ENCOUNTER — Encounter (HOSPITAL_COMMUNITY): Payer: Self-pay | Admitting: General Surgery

## 2011-11-18 ENCOUNTER — Telehealth (INDEPENDENT_AMBULATORY_CARE_PROVIDER_SITE_OTHER): Payer: Self-pay

## 2011-11-18 ENCOUNTER — Telehealth (INDEPENDENT_AMBULATORY_CARE_PROVIDER_SITE_OTHER): Payer: Self-pay | Admitting: General Surgery

## 2011-11-18 ENCOUNTER — Other Ambulatory Visit: Payer: Self-pay | Admitting: Internal Medicine

## 2011-11-18 NOTE — Telephone Encounter (Signed)
error 

## 2011-11-18 NOTE — Telephone Encounter (Signed)
Surgery 11/16/2011 [facility] - C/O pain and swelling on left side.  Patient stated she felt ok yesterday and was active. She has not taken any pain medicine and notice today swelling  and pain on the left side. No problem voiding, passing a little gas. She is going to start taking Doculax today.  Post op expected course reviewed with the patient and advised to call back if she any other concerns. Also advised to take pain medicine when needed and get  rest and use ice pack.

## 2011-11-18 NOTE — Telephone Encounter (Signed)
Patient needs a 3wk po for [facility] repair on 11/16/11, please call

## 2011-11-29 ENCOUNTER — Ambulatory Visit
Admission: RE | Admit: 2011-11-29 | Discharge: 2011-11-29 | Disposition: A | Discharge status: Home / Self Care | Payer: Medicare Other | Source: Ambulatory Visit | Attending: General Surgery | Admitting: General Surgery

## 2011-11-29 ENCOUNTER — Encounter (INDEPENDENT_AMBULATORY_CARE_PROVIDER_SITE_OTHER): Payer: Self-pay | Admitting: General Surgery

## 2011-11-29 ENCOUNTER — Telehealth (INDEPENDENT_AMBULATORY_CARE_PROVIDER_SITE_OTHER): Payer: Self-pay

## 2011-11-29 ENCOUNTER — Ambulatory Visit (INDEPENDENT_AMBULATORY_CARE_PROVIDER_SITE_OTHER): Payer: Medicare Other | Admitting: General Surgery

## 2011-11-29 VITALS — BP 132/86 | HR 97 | Temp 97.8°F | Resp 16 | Ht 60.0 in | Wt 156.0 lb

## 2011-11-29 DIAGNOSIS — R1909 Other intra-abdominal and pelvic swelling, mass and lump: Secondary | ICD-10-CM

## 2011-11-29 DIAGNOSIS — R1900 Intra-abdominal and pelvic swelling, mass and lump, unspecified site: Secondary | ICD-10-CM

## 2011-11-29 DIAGNOSIS — Z4889 Encounter for other specified surgical aftercare: Secondary | ICD-10-CM

## 2011-11-29 DIAGNOSIS — K5730 Diverticulosis of large intestine without perforation or abscess without bleeding: Secondary | ICD-10-CM | POA: Diagnosis not present

## 2011-11-29 DIAGNOSIS — N200 Calculus of kidney: Secondary | ICD-10-CM | POA: Diagnosis not present

## 2011-11-29 DIAGNOSIS — N281 Cyst of kidney, acquired: Secondary | ICD-10-CM | POA: Diagnosis not present

## 2011-11-29 DIAGNOSIS — Z5189 Encounter for other specified aftercare: Secondary | ICD-10-CM

## 2011-11-29 MED ORDER — IOHEXOL 300 MG/ML  SOLN
100.0000 mL | Freq: Once | INTRAMUSCULAR | Status: AC | PRN
  Administered 2011-11-29: 100 mL via INTRAVENOUS

## 2011-11-29 NOTE — Progress Notes (Signed)
Subjective:     Patient ID: Krista Carlson, female   DOB: [birth year], 78 y.o.   MRN: [id number]  HPI The patient follows up in status post open left inguinal hernia an with mesh by Dr. Johna Sheriff in approximately 2 weeks ago. She states that she has noticed increased swelling at the lateral aspect of her incision which is enlarging in size and with some increased in tenderness. She denies any obstructive symptoms and states that her bowels are functioning he denies any nausea or vomiting.    Review of Systems     Objective:   Physical Exam No distress and nontoxic-appearing  She has a 4 cm wide band of swelling and tenderness under her left inguinal incision. I do not appreciate any change with Valsalva. This could be early recurrence versus hematoma versus normal postoperative changes.    Assessment:     Status post left inguinal hernia repair with mesh She has inguinal bulge and swelling which is concerning for possible postoperative hematoma or postoperative changes but this could also represent early postoperative recurrence. I recommended CT scan of the abdomen and pelvis to evaluate further and she will follow up with her after her scan.    Plan:     We will get a CT scan to further evaluate and she will follow up with Dr. Johna Sheriff as soon as available.

## 2011-11-29 NOTE — Telephone Encounter (Signed)
Patient came to office on Monday 11/29/11, patient did call the office (Friday) 11/26/11 with concerns of an area at the hernia repair site.  One of our clinicians states she called the patient back to further assess the patient concerns.  I rec'd a hand-written message from Lucita Lora, she states patient is afebrile, denies drainage, no nausea or vomiting and has a swollen area along her incision.  Patient has been added to Dr. Delice Lesch schedule for further evaluation.

## 2011-11-30 ENCOUNTER — Encounter (INDEPENDENT_AMBULATORY_CARE_PROVIDER_SITE_OTHER): Payer: Self-pay | Admitting: Surgery

## 2011-11-30 ENCOUNTER — Ambulatory Visit (INDEPENDENT_AMBULATORY_CARE_PROVIDER_SITE_OTHER): Payer: Medicare Other | Admitting: Surgery

## 2011-11-30 DIAGNOSIS — IMO0002 Reserved for concepts with insufficient information to code with codable children: Secondary | ICD-10-CM | POA: Insufficient documentation

## 2011-11-30 DIAGNOSIS — R109 Unspecified abdominal pain: Secondary | ICD-10-CM

## 2011-11-30 NOTE — Progress Notes (Signed)
Visit Diagnoses: 1. Abdominal pain   2. Seroma complicating a procedure, left groin after [facility] repair     HISTORY: Patient underwent left inguinal hernia repair with mesh 2 weeks ago. She was seen yesterday by one of my partners and sent for CT scan of the abdomen which demonstrated a moderate sized seroma in the left groin. She returns today for evaluation.  EXAM: Examination shows a moderate seroma. This is mildly symptomatic. Using an 18-gauge needle the cavity is easily accessed and 50 cc of serous fluid is evacuated with improved symptoms.  IMPRESSION: status post left inguinal hernia repair with mesh, postoperative seroma  PLAN: Usual instructions for wound care are given. Patient will return to see her surgeon in 2 weeks.  Velora Heckler, MD, FACS General & Endocrine Surgery [facility]

## 2011-11-30 NOTE — Patient Instructions (Signed)
  COCOA BUTTER & VITAMIN E CREAM  ([employer] or other brand)  Apply cocoa butter/vitamin E cream to your incision 2 - 3 times daily.  Massage cream into incision for one minute with each application.  Use sunscreen (50 SPF or higher) for first 6 months after surgery if area is exposed to sun.  You may substitute Mederma or other scar reducing creams as desired.   

## 2011-12-02 ENCOUNTER — Encounter (INDEPENDENT_AMBULATORY_CARE_PROVIDER_SITE_OTHER): Payer: Medicare Other | Admitting: General Surgery

## 2011-12-10 ENCOUNTER — Ambulatory Visit (INDEPENDENT_AMBULATORY_CARE_PROVIDER_SITE_OTHER): Payer: Medicare Other | Admitting: General Surgery

## 2011-12-10 ENCOUNTER — Encounter (INDEPENDENT_AMBULATORY_CARE_PROVIDER_SITE_OTHER): Payer: Medicare Other | Admitting: General Surgery

## 2011-12-10 ENCOUNTER — Encounter (INDEPENDENT_AMBULATORY_CARE_PROVIDER_SITE_OTHER): Payer: Self-pay | Admitting: General Surgery

## 2011-12-10 VITALS — BP 130/84 | HR 70 | Temp 97.9°F | Resp 18 | Ht 61.0 in | Wt 156.6 lb

## 2011-12-10 DIAGNOSIS — Z09 Encounter for follow-up examination after completed treatment for conditions other than malignant neoplasm: Secondary | ICD-10-CM

## 2011-12-10 NOTE — Progress Notes (Signed)
History: Patient returns 3 weeks following open left inguinal hernia repair. She is feeling well without significant pain. She is concerned about a lump at the incision.  Exam: Her incision is well healed. There is some moderate expected induration around the incision but no evidence of infection or other complication.  Assessment plan: Doing well following anal hernia repair. She's reassure that there is no complication. I told her this will go away completely over the next couple of months. She is released to full activities in another week and will return here as needed for any concerns

## 2011-12-13 ENCOUNTER — Encounter (INDEPENDENT_AMBULATORY_CARE_PROVIDER_SITE_OTHER): Payer: Self-pay

## 2011-12-16 ENCOUNTER — Other Ambulatory Visit: Payer: Self-pay | Admitting: Internal Medicine

## 2012-01-13 ENCOUNTER — Other Ambulatory Visit: Payer: Self-pay | Admitting: Internal Medicine

## 2012-01-13 DIAGNOSIS — H4011X0 Primary open-angle glaucoma, stage unspecified: Secondary | ICD-10-CM | POA: Diagnosis not present

## 2012-01-13 DIAGNOSIS — H409 Unspecified glaucoma: Secondary | ICD-10-CM | POA: Diagnosis not present

## 2012-01-13 NOTE — Telephone Encounter (Signed)
Refill done.  

## 2012-02-11 IMAGING — CT CT ABD-PELV W/ CM
2 of 5 series · 16 of 46 positions shown, 18 images · IV contrast (READICAT/WATER & [ID] OMNI 300)
Comparison: 03/16/2011

CLINICAL DATA: 2 weeks postop from left inguinal hernia repair.
Increased left inguinal soft tissue prominence and pain.
Diverticulosis.

CT ABDOMEN AND PELVIS WITH CONTRAST
TECHNIQUE: Multidetector CT imaging of the abdomen and pelvis was
performed following the standard protocol during bolus
administration of intravenous contrast.
Contrast: 100mL OMNIPAQUE IOHEXOL 300 MG/ML IV SOLN

[Series 2: abd/pelvis with · axial · 0.74mm/px · z∈[-334,+0]mm · 13 of 76 slices shown, 15 images]
[im 5/76  soft-tissue]
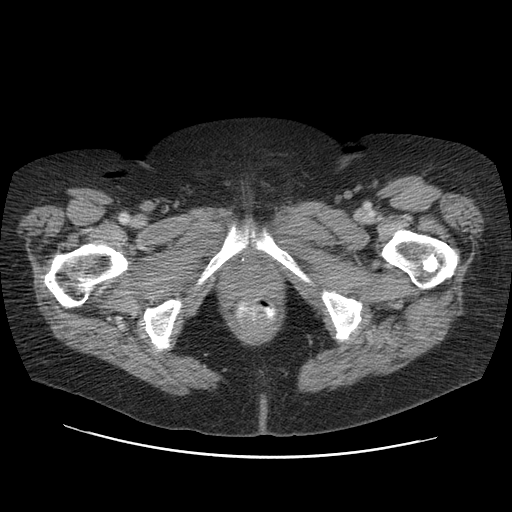
[im 5/76  bone]
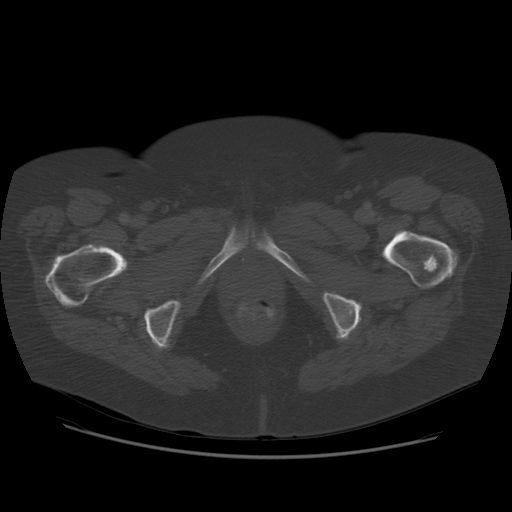
[im 9/76  soft-tissue]
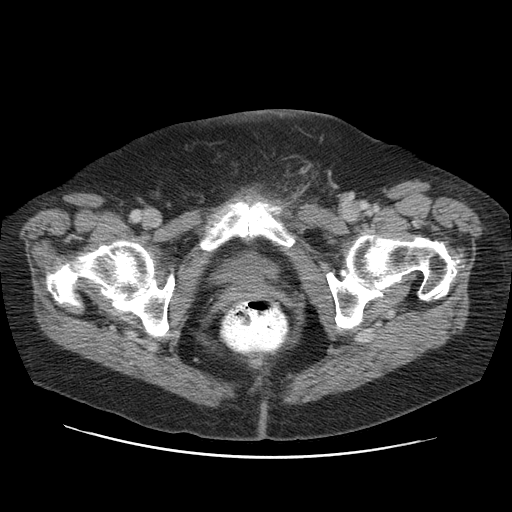
[im 17/76  soft-tissue]
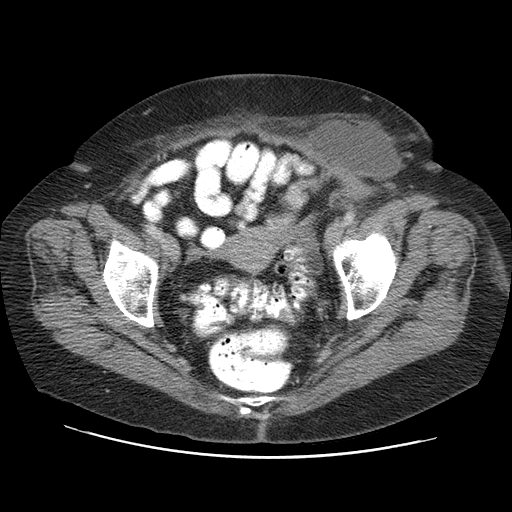
[im 21/76  soft-tissue]
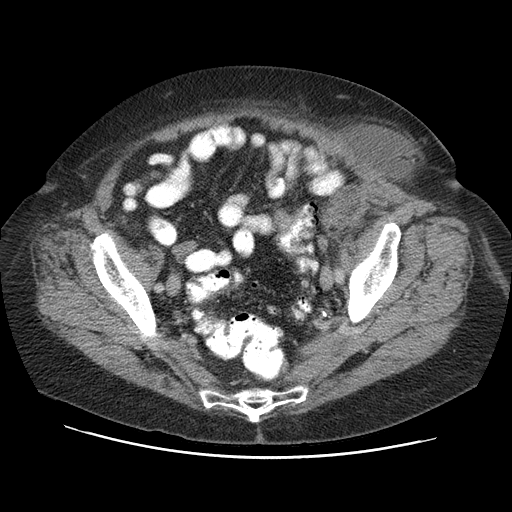
[im 26/76  soft-tissue]
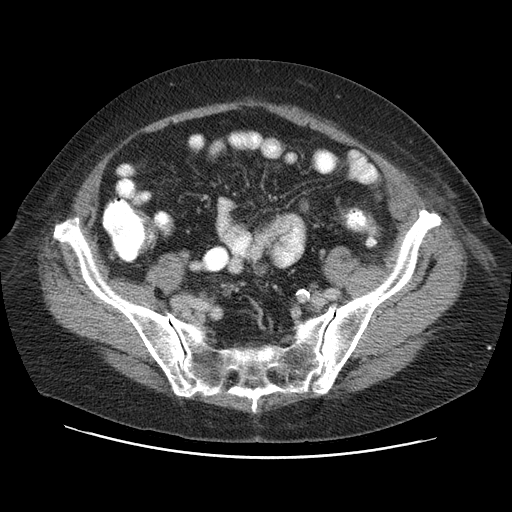
[im 34/76  soft-tissue]
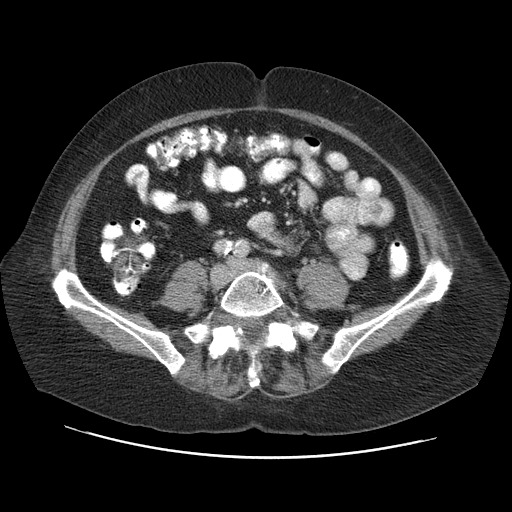
[im 38/76  soft-tissue]
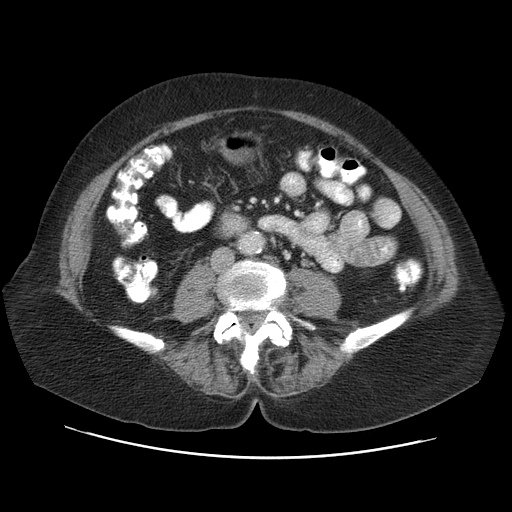
[im 42/76  soft-tissue]
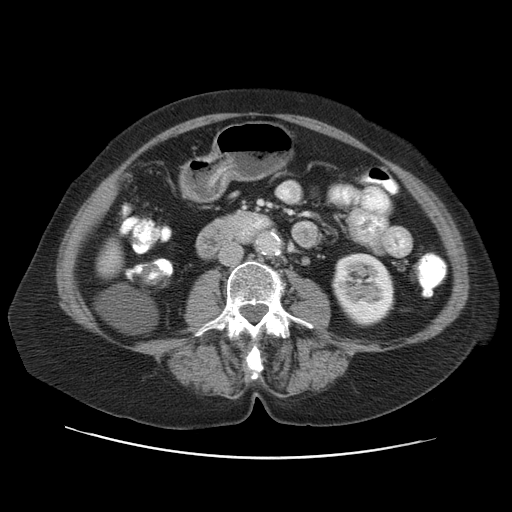
[im 51/76  soft-tissue]
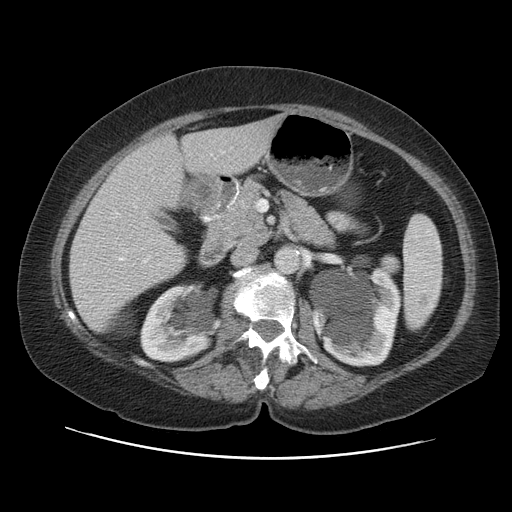
[im 51/76  bone]
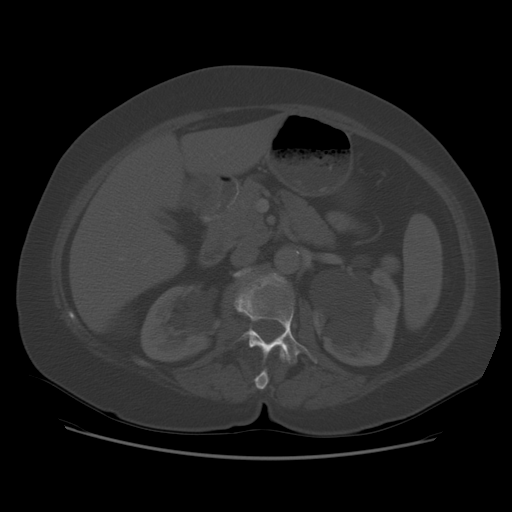
[im 55/76  soft-tissue]
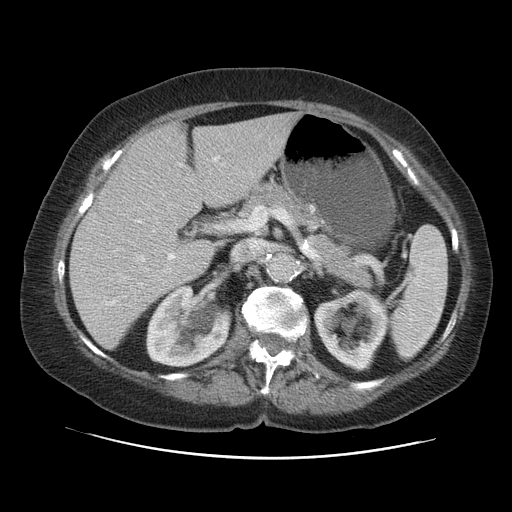
[im 59/76  soft-tissue]
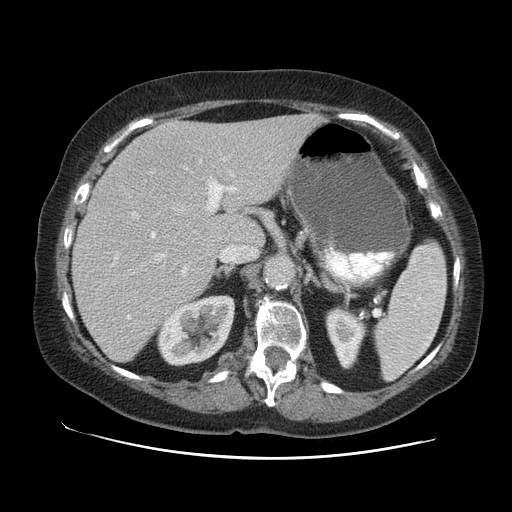
[im 67/76  soft-tissue]
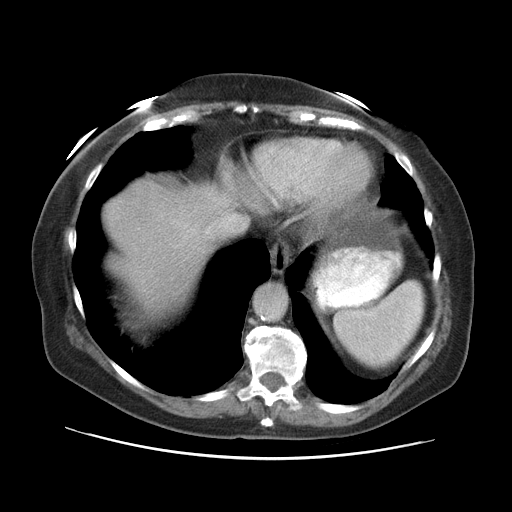
[im 71/76  soft-tissue]
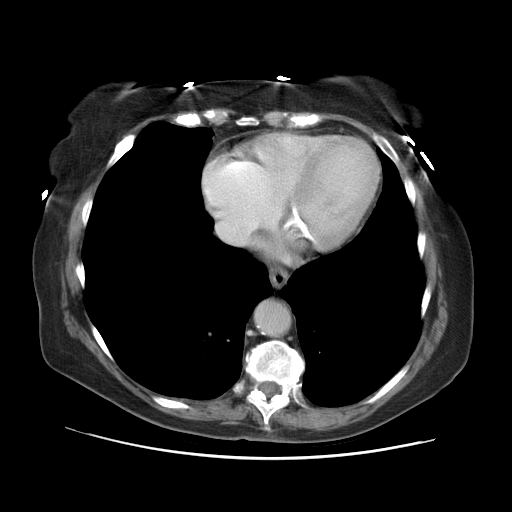

[Series 400: coronal · coronal · 0.86mm/px · 3 of 125 slices shown]
[im 42/125  soft-tissue]
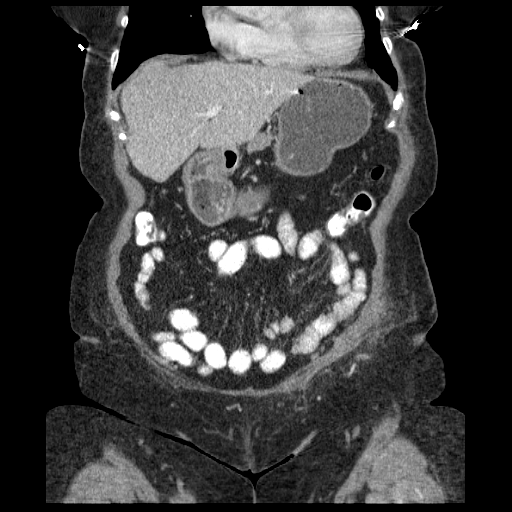
[im 56/125  soft-tissue]
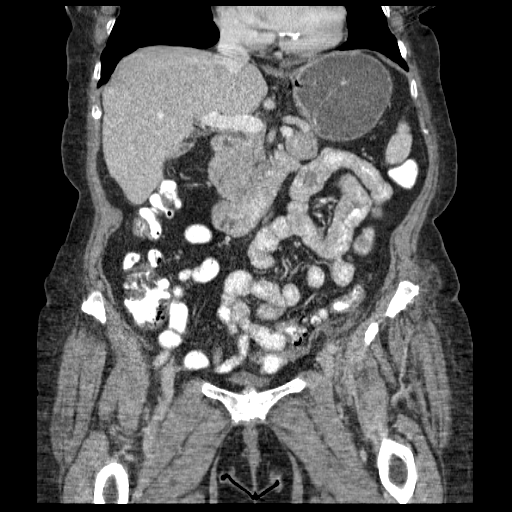
[im 69/125  soft-tissue]
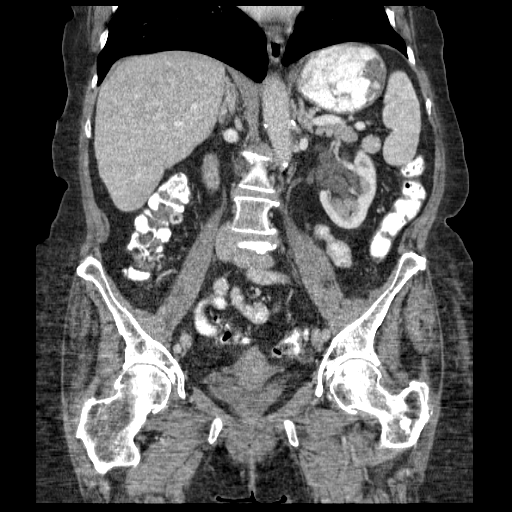

[16 of 46 positions shown; findings below may reference images not displayed]

FINDINGS: Postoperative changes are seen from recent left inguinal
hernia repair, and there is no evidence of recurrent hernia.  A
fluid collection is seen in the saphenous tissues of the right
groin which measures 3.9 x 7.2 cm.  This may represent
postoperative seroma, however infected collection cannot be
excluded by imaging.

Colonic diverticulosis is again demonstrated, however there is no
evidence of diverticulitis.  No other inflammatory process
identified.  No evidence of dilated bowel loops or bowel wall
thickening.

Nonobstructing left lower pole renal calculi are again seen as well
as multiple bilateral parapelvic renal cysts.  There is no evidence
of renal mass or hydronephrosis.  A tiny left renal parenchymal
cyst is again noted.  A benign appearing cyst measuring 5.7 cm is
again seen within the right posterior abdomen in the area of the
Morison's pouch. This remains stable and may represent a peritoneal
inclusion cyst or lymphangioma. No solid soft tissue masses or
lymphadenopathy identified.  The liver, gallbladder, spleen,
pancreas, and adrenal glands are unremarkable in appearance.  Tiny
benign splenic cyst is stable.
IMPRESSION: 1.  7 cm fluid collection in the subcutaneous tissues of the left
groin.  This likely represents a postoperative seroma, although
infected fluid collection cannot definitely be excluded by imaging;
clinical correlation is recommended.  No evidence of recurrent
hernia.
2.  Diverticulosis.  No radiographic evidence of diverticulitis.
3.  Stable nonobstructing left renal calculi and bilateral renal
parapelvic cysts.
4.  Stable benign appearing cystic lesion in the right abdomen,
likely representing a peritoneal inclusion cyst or lymphangioma.

## 2012-02-17 ENCOUNTER — Telehealth (INDEPENDENT_AMBULATORY_CARE_PROVIDER_SITE_OTHER): Payer: Self-pay | Admitting: General Surgery

## 2012-02-21 ENCOUNTER — Telehealth (INDEPENDENT_AMBULATORY_CARE_PROVIDER_SITE_OTHER): Payer: Self-pay | Admitting: General Surgery

## 2012-02-21 NOTE — Telephone Encounter (Signed)
Pt calling back about lump that has never resolved.  It was noted at the post op recheck on 12/10/11 by Dr. Johna Sheriff and told it would resolve over the next few months.  She states it is still there, questionably larger and is now sore, too.  She would like Dr. Johna Sheriff to look at it again.  Next appt > 1 month.  Can she be seen sooner, please.  Call her cell # [phone].

## 2012-02-23 ENCOUNTER — Telehealth (INDEPENDENT_AMBULATORY_CARE_PROVIDER_SITE_OTHER): Payer: Self-pay

## 2012-02-23 NOTE — Telephone Encounter (Signed)
Patient called stating she's having swelling and increase in pain

## 2012-02-23 NOTE — Telephone Encounter (Signed)
Patient scheduled 02/25/12 w/Dr. Johna Sheriff

## 2012-02-25 ENCOUNTER — Ambulatory Visit (INDEPENDENT_AMBULATORY_CARE_PROVIDER_SITE_OTHER): Payer: Medicare Other | Admitting: General Surgery

## 2012-02-25 ENCOUNTER — Encounter (INDEPENDENT_AMBULATORY_CARE_PROVIDER_SITE_OTHER): Payer: Self-pay | Admitting: General Surgery

## 2012-02-25 VITALS — BP 133/81 | HR 64 | Temp 97.8°F | Ht 61.0 in | Wt 155.8 lb

## 2012-02-25 DIAGNOSIS — Z09 Encounter for follow-up examination after completed treatment for conditions other than malignant neoplasm: Secondary | ICD-10-CM

## 2012-02-25 NOTE — Progress Notes (Signed)
History: Patient returns requesting a recheck just over 3 months following left inguinal hernia repair. We did notice some induration at her incision her last postoperative visit. When riding her stationary bike she has felt some mild discomfort in the area and felt a lobe of the lung and was concerned.  Exam: The left groin incision has some very mild induration or slight ridge at the surgical site as would be expected but no fluid collection or inflammation or any evidence of recurrence. It is nontender. There is no hernia on the right.  Assessment plan: I do not see any evidence of complication. I think these mild symptoms will resolve over time. She is reassured and asked her to contact me she is not feeling well in the next 2-3 months .

## 2012-03-09 ENCOUNTER — Telehealth: Payer: Self-pay | Admitting: *Deleted

## 2012-03-09 NOTE — Telephone Encounter (Signed)
Pt called requesting a refill for Trazadone 100-200mg . Ok to refill?

## 2012-03-09 NOTE — Telephone Encounter (Signed)
OK ;# 60.Please take prn only in lowest dose possible  . May affect balance & alertness with increased risk for falls

## 2012-03-10 MED ORDER — TRAZODONE HCL 100 MG PO TABS: 100.0000 mg | ORAL_TABLET | Freq: Every evening | ORAL | Status: DC | PRN

## 2012-03-10 NOTE — Telephone Encounter (Signed)
Discuss with patient, Rx sent. 

## 2012-03-18 ENCOUNTER — Other Ambulatory Visit: Payer: Self-pay | Admitting: Internal Medicine

## 2012-03-23 ENCOUNTER — Other Ambulatory Visit: Payer: Self-pay | Admitting: Internal Medicine

## 2012-05-12 ENCOUNTER — Other Ambulatory Visit: Payer: Medicare Other

## 2012-05-12 LAB — HEMOCCULT SLIDES (X 3 CARDS)
Fecal Occult Blood: NEGATIVE
OCCULT 1: NEGATIVE
OCCULT 2: NEGATIVE
OCCULT 3: NEGATIVE
OCCULT 4: NEGATIVE
OCCULT 5: NEGATIVE

## 2012-05-25 DIAGNOSIS — N8111 Cystocele, midline: Secondary | ICD-10-CM | POA: Diagnosis not present

## 2012-05-29 ENCOUNTER — Other Ambulatory Visit: Payer: Self-pay | Admitting: Internal Medicine

## 2012-05-29 ENCOUNTER — Telehealth (INDEPENDENT_AMBULATORY_CARE_PROVIDER_SITE_OTHER): Payer: Self-pay | Admitting: General Surgery

## 2012-05-29 NOTE — Telephone Encounter (Signed)
Pt had LIH surgery in mid-January 2013; now having pain with standing or walking.  Feels a "hard lump" at the site, but denies bulging.  She wants to see Dr. Johna Sheriff for his opinion.  Please call her cell number.

## 2012-06-13 ENCOUNTER — Other Ambulatory Visit: Payer: Self-pay | Admitting: Internal Medicine

## 2012-06-13 NOTE — Telephone Encounter (Signed)
zostavax vial Last fill: ? Qty: 1 via [facility]stitute and administer as directed in prescribing information

## 2012-06-13 NOTE — Telephone Encounter (Signed)
I spoke with pharmacist at [facility] and gave a verbal

## 2012-06-16 ENCOUNTER — Ambulatory Visit (INDEPENDENT_AMBULATORY_CARE_PROVIDER_SITE_OTHER): Payer: Medicare Other | Admitting: General Surgery

## 2012-06-16 ENCOUNTER — Encounter (INDEPENDENT_AMBULATORY_CARE_PROVIDER_SITE_OTHER): Payer: Self-pay | Admitting: General Surgery

## 2012-06-16 VITALS — BP 116/82 | HR 69 | Temp 98.2°F | Ht 61.5 in | Wt 153.4 lb

## 2012-06-16 DIAGNOSIS — Z8719 Personal history of other diseases of the digestive system: Secondary | ICD-10-CM

## 2012-06-16 DIAGNOSIS — Z9889 Other specified postprocedural states: Secondary | ICD-10-CM

## 2012-06-16 NOTE — Progress Notes (Signed)
History: Patient returns following left inguinal hernia repair in December. She was concerned that she still had some occasional soreness in her left groin. She had one episode of pain when getting out of bed a couple of months ago there was significant but this quickly resolved. Generally she can do what she wants.  Exam: Examination of her abdomen and left groin shows a well-healed wound with no induration or tenderness or any evidence of recurrent hernia.  Assessment and plan: Status post left inguinal hernia repair. Some incisional discomfort. I see no evidence of recurrence or other complication. She is reassured that there is not any specific issue with the surgery and that often discomfort will continue to improve for up to a year after surgery. She is happy that there is no specific problem. I asked her to call me at any point later in the year she is concerned about ongoing symptoms.

## 2012-07-06 ENCOUNTER — Other Ambulatory Visit: Payer: Self-pay | Admitting: Family Medicine

## 2012-07-06 DIAGNOSIS — Z1231 Encounter for screening mammogram for malignant neoplasm of breast: Secondary | ICD-10-CM

## 2012-07-13 DIAGNOSIS — H409 Unspecified glaucoma: Secondary | ICD-10-CM | POA: Diagnosis not present

## 2012-07-13 DIAGNOSIS — H4011X0 Primary open-angle glaucoma, stage unspecified: Secondary | ICD-10-CM | POA: Diagnosis not present

## 2012-07-25 ENCOUNTER — Ambulatory Visit
Admission: RE | Admit: 2012-07-25 | Discharge: 2012-07-25 | Disposition: A | Discharge status: Home / Self Care | Payer: Medicare Other | Source: Ambulatory Visit | Attending: Family Medicine | Admitting: Family Medicine

## 2012-07-25 DIAGNOSIS — Z1231 Encounter for screening mammogram for malignant neoplasm of breast: Secondary | ICD-10-CM | POA: Diagnosis not present

## 2012-08-04 ENCOUNTER — Telehealth: Payer: Self-pay | Admitting: Internal Medicine

## 2012-08-04 NOTE — Telephone Encounter (Signed)
Spoke with patient, Patient contacted GYN will see them on Tuesday. [facility]) will no longer need Chemo

## 2012-08-04 NOTE — Telephone Encounter (Signed)
Caller: Randa Evens (alt [phone])/Patient; Patient Name: Krista Carlson, Krista Carlson([phone]; PCP: Marga Melnick; Best Callback Phone Number: [phone]; Reason for call: Complaints of stomach discomfort since July 2013.  SHe states BM x 6-7 daily. BM are loose to liquid.  She states every time she urinates she feels the need to have a BM. "pressure to rectal area". +gas/flatus.   She Leaks stool.   She states she was diagnosed with Cystocele in August , 2013.  +Urinary Frequency only. Occasional Abdominal discomfort across abdomen, intermittent. Eating and drinking normally. Emergent s/sx ruled out per Rectal Symptoms Protocol with exception to "New onset of uncontrollable leaking of BM , not related to constipation".See Provider in 24 hours.  Reviewed information on Cystocele AND Rectocele.  Home care reviewed . Understanding expressed. Caller will contact her OB/GYN who diagnosed her cystocele and discuss new symptoms. Advised and encouraged to call back for appointment or assistance.

## 2012-08-08 ENCOUNTER — Other Ambulatory Visit: Payer: Self-pay | Admitting: Obstetrics & Gynecology

## 2012-08-08 DIAGNOSIS — Z124 Encounter for screening for malignant neoplasm of cervix: Secondary | ICD-10-CM | POA: Diagnosis not present

## 2012-08-19 ENCOUNTER — Encounter (HOSPITAL_COMMUNITY): Payer: Self-pay | Admitting: Emergency Medicine

## 2012-08-19 ENCOUNTER — Emergency Department (HOSPITAL_COMMUNITY)
Admission: EM | Admit: 2012-08-19 | Discharge: 2012-08-19 | Disposition: A | Discharge status: Home / Self Care | Payer: Medicare Other | Attending: Emergency Medicine | Admitting: Emergency Medicine

## 2012-08-19 ENCOUNTER — Emergency Department (HOSPITAL_COMMUNITY): Payer: Medicare Other

## 2012-08-19 DIAGNOSIS — F329 Major depressive disorder, single episode, unspecified: Secondary | ICD-10-CM | POA: Insufficient documentation

## 2012-08-19 DIAGNOSIS — S82899A Other fracture of unspecified lower leg, initial encounter for closed fracture: Secondary | ICD-10-CM | POA: Insufficient documentation

## 2012-08-19 DIAGNOSIS — S82892A Other fracture of left lower leg, initial encounter for closed fracture: Secondary | ICD-10-CM

## 2012-08-19 DIAGNOSIS — F3289 Other specified depressive episodes: Secondary | ICD-10-CM | POA: Insufficient documentation

## 2012-08-19 DIAGNOSIS — M899 Disorder of bone, unspecified: Secondary | ICD-10-CM | POA: Diagnosis not present

## 2012-08-19 DIAGNOSIS — Z9849 Cataract extraction status, unspecified eye: Secondary | ICD-10-CM | POA: Diagnosis not present

## 2012-08-19 DIAGNOSIS — S8253XA Displaced fracture of medial malleolus of unspecified tibia, initial encounter for closed fracture: Secondary | ICD-10-CM | POA: Diagnosis not present

## 2012-08-19 DIAGNOSIS — M4800 Spinal stenosis, site unspecified: Secondary | ICD-10-CM | POA: Diagnosis not present

## 2012-08-19 DIAGNOSIS — Z87891 Personal history of nicotine dependence: Secondary | ICD-10-CM | POA: Diagnosis not present

## 2012-08-19 DIAGNOSIS — Z888 Allergy status to other drugs, medicaments and biological substances status: Secondary | ICD-10-CM | POA: Insufficient documentation

## 2012-08-19 DIAGNOSIS — E039 Hypothyroidism, unspecified: Secondary | ICD-10-CM | POA: Diagnosis not present

## 2012-08-19 DIAGNOSIS — K900 Celiac disease: Secondary | ICD-10-CM | POA: Diagnosis not present

## 2012-08-19 DIAGNOSIS — I10 Essential (primary) hypertension: Secondary | ICD-10-CM | POA: Insufficient documentation

## 2012-08-19 DIAGNOSIS — W010XXA Fall on same level from slipping, tripping and stumbling without subsequent striking against object, initial encounter: Secondary | ICD-10-CM | POA: Insufficient documentation

## 2012-08-19 MED ORDER — OXYCODONE-ACETAMINOPHEN 5-325 MG PO TABS: 1.0000 | ORAL_TABLET | Freq: Once | ORAL | Status: DC

## 2012-08-19 MED ORDER — IBUPROFEN 200 MG PO TABS
400.0000 mg | ORAL_TABLET | Freq: Once | ORAL | Status: AC
  Administered 2012-08-19: 400 mg via ORAL
  Filled 2012-08-19: qty 2

## 2012-08-19 NOTE — ED Notes (Signed)
Pt refused ordered dose of Percocet. Pt stated that she does not need it

## 2012-08-19 NOTE — ED Provider Notes (Signed)
History    78 year old female with left ankle pain. Acute onset just before arrival this morning. Patient had mechanical fall when she tripped over a bag. Fell onto concrete surface. Did not strike her head. Heard her left ankle pop. Has been able to partially bear some weight but with increased pain. Denies headache, neck or back pain. Chronic numbness of feet. Denies acute change.  CSN: [id number]  Arrival date & time 08/19/12  4098   First MD Initiated Contact with Patient 08/19/12 0740      Chief Complaint  Patient presents with  . Ankle Pain    (Consider location/radiation/quality/duration/timing/severity/associated sxs/prior treatment) HPI  Past Medical History  Diagnosis Date  . Osteopenia   . Aortic heart murmur   . Diverticulitis of colon   . Glaucoma(365)   . History of colonic polyps     3 mm, Dr Laural Benes  . Hypothyroidism   . Spinal stenosis   . Hypertension   . Heart murmur   . Anemia   . Gluten-induced enteropathy   . Glaucoma(365) 2012  . Arthritis   . Diarrhea   . Kidney stone   . Celiac disease   . Depression     Past Surgical History  Procedure Date  . Cataract extraction   . Hemorrhoid surgery   . Tubal ligation   . Polypectomy     due 2015, 3 mm  . Appendectomy     @ tubal  . Tonsillectomy   . Hernia repair 1943    Right Side  . Knee arthroscopy 2007    Left  . Nose surgery 2007    Fractured Nose Reset  . Inguinal hernia repair 11/16/2011    Procedure: HERNIA REPAIR INGUINAL ADULT;  Surgeon: Mariella Saa, MD;  Location: [facility];  Service: General;  Laterality: Left;  with mesh    Family History  Problem Relation Age of Onset  . Depression Mother     manic  . Coronary artery disease Maternal Uncle     X 6  . Cancer Maternal Uncle     intestinal  . Heart attack Mother 64  . COPD Mother   . Breast cancer Paternal Aunt   . Depression      1 M aunt & 2 M uncles    History  Substance Use Topics  . Smoking status: Former  Smoker    Quit date: 11/01/1984  . Smokeless tobacco: Never Used   Comment: off/on stopper age 66  . Alcohol Use: 0.5 oz/week    1 drink(s) per week     whiskey    OB History    Grav Para Term Preterm Abortions TAB SAB Ect Mult Living                  Review of Systems   Review of symptoms negative unless otherwise noted in HPI.   Allergies  Nickel and Amoxicillin-pot clavulanate  Home Medications   Current Outpatient Rx  Name Route Sig Dispense Refill  . BUPROPION HCL ER (XL) 150 MG PO TB24 Oral Take 150 mg by mouth 3 (three) times a week. Monday, Wednesday, and Friday     . CALCIUM + D PO Oral Take 600 mg by mouth 2 (two) times daily.     . CYANOCOBALAMIN 1000 MCG/ML IJ SOLN Intramuscular Inject 1,000 mcg into the muscle every 30 (thirty) days.     . CYCLOSPORINE 0.05 % OP EMUL Both Eyes Place 1 drop into both eyes at bedtime.     Marland Kitchen  OMEGA-3 FATTY ACIDS 1000 MG PO CAPS Oral Take 1 g by mouth 2 (two) times daily.     Marland Kitchen FOSAMAX 70 MG PO TABS  TAKE 1 TAB ONCE A WK AT LEAST 30 MIN BEFORE 1ST FOOD.DO NOT LIE DOWN FOR 30 MIN AFTER TAKING. 4 each 11  . COSAMIN DS PO Oral Take 1 tablet by mouth 2 (two) times daily.      . MULTIVITAMINS PO TABS Oral Take 1 tablet by mouth daily.      . NORVASC 10 MG PO TABS  TAKE (1/2) TABLET DAILY IN THE MORNING. 30 each 6  . ROSUVASTATIN CALCIUM 20 MG PO TABS Oral Take 10 mg by mouth every Tuesday, Thursday, and Saturday at 6 PM. IN THE MORNING    . SYNTHROID 112 MCG PO TABS  TAKE 1 TABLET ONCE DAILY, EXCEPT 1&1/2 TABLETS ON WEDNESDAY. 32 each 6  . TIMOLOL HEMIHYDRATE 0.5 % OP SOLN Both Eyes Place 1 drop into both eyes daily.     Marland Kitchen TIMOLOL MALEATE 0.5 % OP SOLN      . TRAZODONE HCL 100 MG PO TABS  1-2 TABS AT BEDTIME AS NEEDED FOR SLEEP AT LOWEST DOSE POSSIBLE TO AVOID RISK OF FALLS. 60 tablet 1    BP 145/86  Pulse 78  Temp 97.8 F (36.6 C) (Oral)  Resp 16  SpO2 98%  Physical Exam  Nursing note and vitals reviewed. Constitutional: She  appears well-developed and well-nourished. No distress.  HENT:  Head: Normocephalic and atraumatic.  Eyes: Conjunctivae normal are normal. Right eye exhibits no discharge. Left eye exhibits no discharge.  Neck: Neck supple.  Cardiovascular: Normal rate, regular rhythm and normal heart sounds.  Exam reveals no gallop and no friction rub.   No murmur heard. Pulmonary/Chest: Effort normal and breath sounds normal. No respiratory distress.  Abdominal: Soft. She exhibits no distension. There is no tenderness.  Musculoskeletal: She exhibits no edema and no tenderness.       Tenderness over the medial malleolus of the left ankle. Mild soft tissue swelling. No overlying skin changes. NVI distally. No tenderness of proximal tib/fib or pain with compression.  Neurological: She is alert.  Skin: Skin is warm and dry.  Psychiatric: She has a normal mood and affect. Her behavior is normal. Thought content normal.    ED Course  Procedures (including critical care time)  Labs Reviewed - No data to display Dg Ankle Complete Left  08/19/2012  *RADIOLOGY REPORT*  Clinical Data: Post fall, now with medial ankle pain  LEFT ANKLE COMPLETE - 3+ VIEW  Comparison: None.  Findings:  There is a minimally displaced oblique fracture of the medial malleolus with extension to the tibial talar joint.  There is no definitive widening of the ankle mortise.  There is expected adjacent soft tissue swelling and possible small ankle joint effusion.  No radiopaque foreign body.  Small plantar calcaneal spur.  IMPRESSION: Minimally displaced oblique fracture of the medial malleolus with extension to the tibial talar joint without definite widening of the ankle mortise.  Dedicated tib-fib radiographs are recommended to evaluate for a Maisonneuve type injury.   Original Report Authenticated By: Waynard Reeds, M.D.      1. Closed left ankle fracture       MDM  78 year old female with left ankle pain. Imaging significant for  a mildly displaced fracture of the medial malleolus. This is a closed injury. She is neurovascular intact distally. There is no tenderness of the proximal  tib-fib or pain with stressing to Maisonneuve fracture. Patient was placed in a splint. Crutches. When necessary pain medication. Orthopedic followup.        Raeford Razor, MD 08/19/12 [phone]

## 2012-08-19 NOTE — ED Notes (Signed)
Pt supplied a boot from home, which ED MD approved for use. Pt's own boot applied to RLE by ED staff. Pt given crutches with education and pt given opportunity to practice. Pt ambulated in hallways and went to restroom. Pt felt comfortable with using crutches and had demonstrated appropriate usage. All questions answered.

## 2012-08-19 NOTE — ED Notes (Signed)
Pt tripped over bag and fell onto concrete, heard left ankle pop. Now w/ swelling, pain on weight bearing, pain w/ movement. Denies any other injury

## 2012-08-21 DIAGNOSIS — S8253XA Displaced fracture of medial malleolus of unspecified tibia, initial encounter for closed fracture: Secondary | ICD-10-CM | POA: Diagnosis not present

## 2012-08-22 DIAGNOSIS — Y999 Unspecified external cause status: Secondary | ICD-10-CM | POA: Diagnosis not present

## 2012-08-22 DIAGNOSIS — Y929 Unspecified place or not applicable: Secondary | ICD-10-CM | POA: Diagnosis not present

## 2012-08-22 DIAGNOSIS — X500XXA Overexertion from strenuous movement or load, initial encounter: Secondary | ICD-10-CM | POA: Diagnosis not present

## 2012-08-22 DIAGNOSIS — S8253XA Displaced fracture of medial malleolus of unspecified tibia, initial encounter for closed fracture: Secondary | ICD-10-CM | POA: Diagnosis not present

## 2012-08-22 DIAGNOSIS — Y939 Activity, unspecified: Secondary | ICD-10-CM | POA: Diagnosis not present

## 2012-08-28 ENCOUNTER — Other Ambulatory Visit: Payer: Self-pay | Admitting: Internal Medicine

## 2012-08-29 DIAGNOSIS — N200 Calculus of kidney: Secondary | ICD-10-CM | POA: Diagnosis not present

## 2012-08-29 DIAGNOSIS — R1032 Left lower quadrant pain: Secondary | ICD-10-CM | POA: Diagnosis not present

## 2012-09-07 DIAGNOSIS — Z23 Encounter for immunization: Secondary | ICD-10-CM | POA: Diagnosis not present

## 2012-09-13 ENCOUNTER — Telehealth: Payer: Self-pay | Admitting: Internal Medicine

## 2012-09-13 NOTE — Telephone Encounter (Signed)
Refill: trazadone 100 mg tablet. 1-2 tabs at bedtime as needed for sleep at lowest dose possible to avoid risk of falls. Qty 60. Last fill 07-17-12

## 2012-09-14 MED ORDER — TRAZODONE HCL 100 MG PO TABS: 100.0000 mg | ORAL_TABLET | Freq: Every evening | ORAL | Status: DC | PRN

## 2012-09-14 NOTE — Telephone Encounter (Signed)
#   60 ; 1-2 qhs prn only

## 2012-09-14 NOTE — Telephone Encounter (Signed)
Rx sent.    MW 

## 2012-09-14 NOTE — Telephone Encounter (Signed)
Last OV Jan 2013, last filled 05/29/12 #60/1 refill  Hopp please advise

## 2012-09-20 DIAGNOSIS — S8290XD Unspecified fracture of unspecified lower leg, subsequent encounter for closed fracture with routine healing: Secondary | ICD-10-CM | POA: Diagnosis not present

## 2012-10-01 ENCOUNTER — Other Ambulatory Visit: Payer: Self-pay | Admitting: Internal Medicine

## 2012-10-02 NOTE — Telephone Encounter (Signed)
Rx sent.    MW 

## 2012-10-06 DIAGNOSIS — S8290XD Unspecified fracture of unspecified lower leg, subsequent encounter for closed fracture with routine healing: Secondary | ICD-10-CM | POA: Diagnosis not present

## 2012-10-19 ENCOUNTER — Other Ambulatory Visit: Payer: Self-pay | Admitting: Internal Medicine

## 2012-11-07 DIAGNOSIS — S8253XA Displaced fracture of medial malleolus of unspecified tibia, initial encounter for closed fracture: Secondary | ICD-10-CM | POA: Diagnosis not present

## 2012-11-09 ENCOUNTER — Other Ambulatory Visit: Payer: Self-pay | Admitting: Internal Medicine

## 2012-11-09 NOTE — Telephone Encounter (Signed)
TSH 244.9 

## 2012-11-10 DIAGNOSIS — S8253XA Displaced fracture of medial malleolus of unspecified tibia, initial encounter for closed fracture: Secondary | ICD-10-CM | POA: Diagnosis not present

## 2012-11-14 DIAGNOSIS — S8253XA Displaced fracture of medial malleolus of unspecified tibia, initial encounter for closed fracture: Secondary | ICD-10-CM | POA: Diagnosis not present

## 2012-11-16 DIAGNOSIS — S8253XA Displaced fracture of medial malleolus of unspecified tibia, initial encounter for closed fracture: Secondary | ICD-10-CM | POA: Diagnosis not present

## 2012-11-21 DIAGNOSIS — S8253XA Displaced fracture of medial malleolus of unspecified tibia, initial encounter for closed fracture: Secondary | ICD-10-CM | POA: Diagnosis not present

## 2012-11-22 DIAGNOSIS — L680 Hirsutism: Secondary | ICD-10-CM | POA: Diagnosis not present

## 2012-11-22 DIAGNOSIS — D235 Other benign neoplasm of skin of trunk: Secondary | ICD-10-CM | POA: Diagnosis not present

## 2012-11-23 DIAGNOSIS — S8253XA Displaced fracture of medial malleolus of unspecified tibia, initial encounter for closed fracture: Secondary | ICD-10-CM | POA: Diagnosis not present

## 2012-11-24 ENCOUNTER — Encounter (HOSPITAL_COMMUNITY): Payer: Self-pay | Admitting: *Deleted

## 2012-11-24 ENCOUNTER — Emergency Department (HOSPITAL_COMMUNITY): Payer: Medicare Other

## 2012-11-24 ENCOUNTER — Emergency Department (HOSPITAL_COMMUNITY)
Admission: EM | Admit: 2012-11-24 | Discharge: 2012-11-24 | Discharge status: Absent without leave | Payer: Medicare Other | Source: Home / Self Care

## 2012-11-24 ENCOUNTER — Encounter (HOSPITAL_COMMUNITY): Payer: Self-pay | Admitting: Emergency Medicine

## 2012-11-24 ENCOUNTER — Telehealth: Payer: Self-pay | Admitting: Internal Medicine

## 2012-11-24 ENCOUNTER — Emergency Department (INDEPENDENT_AMBULATORY_CARE_PROVIDER_SITE_OTHER)
Admission: EM | Admit: 2012-11-24 | Discharge: 2012-11-24 | Disposition: A | Discharge status: Nursing Home (Includes ICF) | Payer: Medicare Other | Source: Home / Self Care

## 2012-11-24 ENCOUNTER — Emergency Department (HOSPITAL_COMMUNITY)
Admission: EM | Admit: 2012-11-24 | Discharge: 2012-11-24 | Disposition: A | Discharge status: Home / Self Care | Payer: Medicare Other | Attending: Emergency Medicine | Admitting: Emergency Medicine

## 2012-11-24 DIAGNOSIS — M545 Low back pain, unspecified: Secondary | ICD-10-CM | POA: Insufficient documentation

## 2012-11-24 DIAGNOSIS — Z87442 Personal history of urinary calculi: Secondary | ICD-10-CM | POA: Diagnosis not present

## 2012-11-24 DIAGNOSIS — I359 Nonrheumatic aortic valve disorder, unspecified: Secondary | ICD-10-CM | POA: Diagnosis not present

## 2012-11-24 DIAGNOSIS — Z860100 Personal history of colon polyps, unspecified: Secondary | ICD-10-CM | POA: Insufficient documentation

## 2012-11-24 DIAGNOSIS — Z8669 Personal history of other diseases of the nervous system and sense organs: Secondary | ICD-10-CM | POA: Insufficient documentation

## 2012-11-24 DIAGNOSIS — R109 Unspecified abdominal pain: Secondary | ICD-10-CM | POA: Diagnosis not present

## 2012-11-24 DIAGNOSIS — Z862 Personal history of diseases of the blood and blood-forming organs and certain disorders involving the immune mechanism: Secondary | ICD-10-CM | POA: Diagnosis not present

## 2012-11-24 DIAGNOSIS — F329 Major depressive disorder, single episode, unspecified: Secondary | ICD-10-CM | POA: Diagnosis not present

## 2012-11-24 DIAGNOSIS — Z87891 Personal history of nicotine dependence: Secondary | ICD-10-CM | POA: Insufficient documentation

## 2012-11-24 DIAGNOSIS — Z79899 Other long term (current) drug therapy: Secondary | ICD-10-CM | POA: Diagnosis not present

## 2012-11-24 DIAGNOSIS — I10 Essential (primary) hypertension: Secondary | ICD-10-CM | POA: Diagnosis not present

## 2012-11-24 DIAGNOSIS — R1032 Left lower quadrant pain: Secondary | ICD-10-CM | POA: Insufficient documentation

## 2012-11-24 DIAGNOSIS — Z8719 Personal history of other diseases of the digestive system: Secondary | ICD-10-CM | POA: Insufficient documentation

## 2012-11-24 DIAGNOSIS — E039 Hypothyroidism, unspecified: Secondary | ICD-10-CM | POA: Diagnosis not present

## 2012-11-24 DIAGNOSIS — Z8739 Personal history of other diseases of the musculoskeletal system and connective tissue: Secondary | ICD-10-CM | POA: Diagnosis not present

## 2012-11-24 DIAGNOSIS — F3289 Other specified depressive episodes: Secondary | ICD-10-CM | POA: Insufficient documentation

## 2012-11-24 LAB — URINE MICROSCOPIC-ADD ON

## 2012-11-24 LAB — COMPREHENSIVE METABOLIC PANEL
AST: 15 U/L (ref 0–37)
Albumin: 4 g/dL (ref 3.5–5.2)
BUN: 11 mg/dL (ref 6–23)
Calcium: 9.5 mg/dL (ref 8.4–10.5)
Chloride: 100 mEq/L (ref 96–112)
Creatinine, Ser: 0.59 mg/dL (ref 0.50–1.10)
Total Bilirubin: 0.6 mg/dL (ref 0.3–1.2)
Total Protein: 7 g/dL (ref 6.0–8.3)

## 2012-11-24 LAB — POCT URINALYSIS DIP (DEVICE)
Protein, ur: NEGATIVE mg/dL
Specific Gravity, Urine: 1.02 (ref 1.005–1.030)
Urobilinogen, UA: 0.2 mg/dL (ref 0.0–1.0)
pH: 6.5 (ref 5.0–8.0)

## 2012-11-24 LAB — URINALYSIS, ROUTINE W REFLEX MICROSCOPIC
Bilirubin Urine: NEGATIVE
Glucose, UA: NEGATIVE mg/dL
Ketones, ur: 15 mg/dL — AB
Protein, ur: NEGATIVE mg/dL

## 2012-11-24 LAB — CBC WITH DIFFERENTIAL/PLATELET
Basophils Absolute: 0 10*3/uL (ref 0.0–0.1)
Basophils Relative: 0 % (ref 0–1)
Eosinophils Absolute: 0.1 10*3/uL (ref 0.0–0.7)
Eosinophils Relative: 1 % (ref 0–5)
HCT: 39.4 % (ref 36.0–46.0)
Hemoglobin: 13.7 g/dL (ref 12.0–15.0)
MCH: 30.9 pg (ref 26.0–34.0)
MCHC: 34.8 g/dL (ref 30.0–36.0)
Monocytes Absolute: 0.6 10*3/uL (ref 0.1–1.0)
Monocytes Relative: 7 % (ref 3–12)
Neutro Abs: 6.3 10*3/uL (ref 1.7–7.7)
RDW: 13.5 % (ref 11.5–15.5)

## 2012-11-24 MED ORDER — OXYCODONE-ACETAMINOPHEN 5-325 MG PO TABS: 1.0000 | ORAL_TABLET | Freq: Three times a day (TID) | ORAL | Status: DC | PRN

## 2012-11-24 NOTE — Telephone Encounter (Signed)
Per Dr.Hopper patient to be seen at Urgent Care, patient states she has other engagements today and she is unable to go to urgent care. Patient states " I can bear the pain." Patient asked to be seen on Monday, Dr.Hopper then spoke with the patient (was standing in front of me at the time of call) and stressed the importance of seeking medical attention today. Patient verbalized understanding and said ok.

## 2012-11-24 NOTE — ED Notes (Signed)
Pt states would like to have US done at [facility] tomorrow.

## 2012-11-24 NOTE — ED Provider Notes (Signed)
Medical screening examination/treatment/procedure(s) were performed by non-physician practitioner and as supervising physician I was immediately available for consultation/collaboration.  Leslee Home, M.D.   Reuben Likes, MD 11/24/12 2015

## 2012-11-24 NOTE — ED Notes (Signed)
Lower abd pain since last pm no n v or diarrhea.  Frequent stools but not diarrhea

## 2012-11-24 NOTE — ED Notes (Signed)
Pt dc to home.  Pt states understanding to dc paperwork. Pt ambulatory to exit without difficulty.   

## 2012-11-24 NOTE — Telephone Encounter (Signed)
Patient Information:  Caller Name: Kip  Phone: [phone]  Patient: Krista Carlson  Gender: Female  DOB: 08-10-1932  Age: 79 Years  PCP: Marga Melnick  Office Follow Up:  Does the office need to follow up with this patient?: Yes  Instructions For The Office: Declines ED and declines 3:15 appt because she has to go to appt with husband at 4:00.  Requesting appt before that time, is 15 min from the office.  RN Note:  Developed lower abdominal pain that is dull.  Pain is constant.  Symptoms  Reason For Call & Symptoms: Abdominal Pain  Reviewed Health History In EMR: Yes  Reviewed Medications In EMR: Yes  Reviewed Allergies In EMR: Yes  Reviewed Surgeries / Procedures: Yes  Date of Onset of Symptoms: 11/23/2012  Guideline(s) Used:  Abdominal Pain - Female  Disposition Per Guideline:   Go to ED Now (or to Office with PCP Approval)  Reason For Disposition Reached:   Constant abdominal pain lasting > 2 hours  Advice Given:  Call Back If:  You become worse.  Rest:  Lie down and rest until you feel better.  Patient Refused Recommendation:  Patient Refused Care Advice  Pt unable to go to ED and unable to come to first available appt at 3:15.  Requesting to be seen in office before 3:15.  Husband is very sick and has appt at 4:00 that she must attend.

## 2012-11-24 NOTE — ED Notes (Signed)
Pt c/o lower abdominal pain that started last night. Hx of celiac disease.   Pain is steady. Dull ache.  Pt has not taken any otc pain meds.   Pt denies fever. N/v/d. And urinary symptoms.

## 2012-11-24 NOTE — ED Provider Notes (Signed)
History     CSN: [id number]  Arrival date & time 11/24/12  2017   First MD Initiated Contact with Patient 11/24/12 2141      Chief Complaint  Patient presents with  . Abdominal Pain    (Consider location/radiation/quality/duration/timing/severity/associated sxs/prior treatment) HPI  The patient presents with concerns of abdominal pain.  She states her pain began approximately 16 hours ago, after awakening.  Initially the pain is diffuse across the lower abdomen, sore, pressure-like.  Subsequently the pain according to the left lower quadrant.  The pain then resolved without clear effort.  The pain was present she was evaluated at an urgent care Center, and sent here for evaluation.  She states that she was told she may have an aortic aneurysm. On presentation, the patient states that she had mild low back pain on the left side yesterday, none presently. She denies nausea, vomiting, diarrhea, dysuria, hematuria. She states that she has a history of kidney stones.  Notably, the patient has a social history remarkable for her husband currently undergoing therapy for stage IV carcinoma.  She is the primary caregiver.  Past Medical History  Diagnosis Date  . Osteopenia   . Aortic heart murmur   . Diverticulitis of colon   . Glaucoma(365)   . History of colonic polyps     3 mm, Dr Laural Benes  . Hypothyroidism   . Spinal stenosis   . Hypertension   . Heart murmur   . Anemia   . Gluten-induced enteropathy   . Glaucoma(365) 2012  . Arthritis   . Diarrhea   . Kidney stone   . Celiac disease   . Depression     Past Surgical History  Procedure Date  . Cataract extraction   . Hemorrhoid surgery   . Tubal ligation   . Polypectomy     due 2015, 3 mm  . Appendectomy     @ tubal  . Tonsillectomy   . Hernia repair 1943    Right Side  . Knee arthroscopy 2007    Left  . Nose surgery 2007    Fractured Nose Reset  . Inguinal hernia repair 11/16/2011    Procedure: HERNIA REPAIR  INGUINAL ADULT;  Surgeon: Mariella Saa, MD;  Location: [facility];  Service: General;  Laterality: Left;  with mesh    Family History  Problem Relation Age of Onset  . Depression Mother     manic  . Coronary artery disease Maternal Uncle     X 6  . Cancer Maternal Uncle     intestinal  . Heart attack Mother 109  . COPD Mother   . Breast cancer Paternal Aunt   . Depression      1 M aunt & 2 M uncles    History  Substance Use Topics  . Smoking status: Former Smoker    Quit date: 11/01/1984  . Smokeless tobacco: Never Used     Comment: off/on stopper age 89  . Alcohol Use: 0.5 oz/week    1 drink(s) per week     Comment: whiskey    OB History    Grav Para Term Preterm Abortions TAB SAB Ect Mult Living                  Review of Systems  Constitutional:       Per HPI, otherwise negative  HENT:       Per HPI, otherwise negative  Eyes: Negative.   Respiratory:  Per HPI, otherwise negative  Cardiovascular:       Per HPI, otherwise negative  Gastrointestinal: Negative for vomiting.  Genitourinary: Negative.   Musculoskeletal:       Per HPI, otherwise negative  Skin: Negative.   Neurological: Negative for syncope.    Allergies  Nickel and Amoxicillin-pot clavulanate  Home Medications   Current Outpatient Rx  Name  Route  Sig  Dispense  Refill  . BUPROPION HCL ER (XL) 150 MG PO TB24      TAKE ONE TABLET EVERY OTHER DAY.   30 tablet   0   . CALCIUM + D PO   Oral   Take 600 mg by mouth 2 (two) times daily.          . CRESTOR 20 MG PO TABS      TAKE 1 TABLET ONCE A DAY AS DIRECTED.   30 tablet   1   . CYANOCOBALAMIN 1000 MCG/ML IJ SOLN   Intramuscular   Inject 1,000 mcg into the muscle every 30 (thirty) days.          . CYCLOSPORINE 0.05 % OP EMUL   Both Eyes   Place 1 drop into both eyes at bedtime.          . OMEGA-3 FATTY ACIDS 1000 MG PO CAPS   Oral   Take 1 g by mouth 2 (two) times daily.          Marland Kitchen FOSAMAX 70 MG PO TABS       TAKE 1 TAB ONCE A WK AT LEAST 30 MIN BEFORE 1ST FOOD.DO NOT LIE DOWN FOR 30 MIN AFTER TAKING.   4 each   11   . COSAMIN DS PO   Oral   Take 1 tablet by mouth 2 (two) times daily.           Marland Kitchen LEVOTHYROXINE SODIUM 112 MCG PO TABS      LABS DUE, 1 by mouth daily, EXCEPT 1 1/2 on Wed   32 tablet   0   . MULTIVITAMINS PO TABS   Oral   Take 1 tablet by mouth daily.           . NORVASC 10 MG PO TABS      TAKE (1/2) TABLET DAILY IN THE MORNING.   30 each   6   . OXYCODONE-ACETAMINOPHEN 5-325 MG PO TABS   Oral   Take 1 tablet by mouth every 8 (eight) hours as needed for pain.   15 tablet   0   . TIMOLOL HEMIHYDRATE 0.5 % OP SOLN   Both Eyes   Place 1 drop into both eyes daily.          Marland Kitchen TIMOLOL MALEATE 0.5 % OP SOLN               . TRAZODONE HCL 100 MG PO TABS   Oral   Take 1 tablet (100 mg total) by mouth at bedtime as needed for sleep.   60 tablet   0     BP 147/76  Pulse 68  Temp 98.6 F (37 C) (Oral)  Resp 20  SpO2 99%  Physical Exam  Nursing note and vitals reviewed. Constitutional: She is oriented to person, place, and time. She appears well-developed and well-nourished. No distress.  HENT:  Head: Normocephalic and atraumatic.  Eyes: Conjunctivae normal and EOM are normal.  Cardiovascular: Normal rate and regular rhythm.   Pulmonary/Chest: Effort normal and breath sounds normal. No stridor. No  respiratory distress.  Abdominal: Soft. Bowel sounds are normal. She exhibits no distension and no mass. There is no tenderness. There is no rebound and no guarding.  Musculoskeletal: She exhibits no edema.  Neurological: She is alert and oriented to person, place, and time. No cranial nerve deficit.  Skin: Skin is warm and dry.  Psychiatric: She has a normal mood and affect.    ED Course  Procedures (including critical care time)  Labs Reviewed  URINALYSIS, ROUTINE W REFLEX MICROSCOPIC - Abnormal; Notable for the following:    Hgb urine  dipstick SMALL (*)     Ketones, ur 15 (*)     Leukocytes, UA LARGE (*)     All other components within normal limits  COMPREHENSIVE METABOLIC PANEL - Abnormal; Notable for the following:    Potassium 3.3 (*)     Glucose, Bld 100 (*)     GFR calc non Af Amer 86 (*)     All other components within normal limits  URINE MICROSCOPIC-ADD ON - Abnormal; Notable for the following:    Squamous Epithelial / LPF FEW (*)     Bacteria, UA FEW (*)     All other components within normal limits  CBC WITH DIFFERENTIAL  URINE CULTURE   No results found.   1. Abdominal pain     A review of the patient's chart demonstrates that she is a CT scan one year ago that demonstrated kidney stones, but no other acute abdominal findings.    MDM  This pleasant elderly female presents asymptomatic, but after a period of abdominal pain earlier today.  Given the initial evaluation, and concern for aortic aneurysm, though this was not likely given my physical exam, the patient scheduled for ultrasound here.  She deferred this, requested discharge, with outpatient ultrasound.  Given the patient's domestic requirements, this was accommodated.  However, the patient's presentation is most consistent with kidney stone, given the description of prior back pain, now with left lower quadrant pain, and the presence of blood in her urine, in addition to her history of prior kidney stones.  We discussed the need for return precautions, followup, and she was discharged per her request.  She has capacity to make that request.        Gerhard Munch, MD 11/25/12 0003

## 2012-11-24 NOTE — ED Provider Notes (Addendum)
History     CSN: [id number]  Arrival date & time 11/24/12  1722   None     Chief Complaint  Patient presents with  . Abdominal Pain    lower abdominal pain since last night.     (Consider location/radiation/quality/duration/timing/severity/associated sxs/prior treatment) HPI Comments: 79 year old female awoke last night with pain across her lower abdomen. The pain has persisted and progressed throughout the day. She is describing it as mild to moderate in intensity. She denies associated fever, chills, shortness of breath, nausea, vomiting, diarrhea or GI bleeding. She does not feel ill. Her only complaint is the pain in the abdomen that feels as though it starts at the midline and migrates bilaterally. She has no urinary symptoms. She does have a cystocele and her urination is  more frequent than usual but this has not changed.   Past Medical History  Diagnosis Date  . Osteopenia   . Aortic heart murmur   . Diverticulitis of colon   . Glaucoma(365)   . History of colonic polyps     3 mm, Dr Laural Benes  . Hypothyroidism   . Spinal stenosis   . Hypertension   . Heart murmur   . Anemia   . Gluten-induced enteropathy   . Glaucoma(365) 2012  . Arthritis   . Diarrhea   . Kidney stone   . Celiac disease   . Depression     Past Surgical History  Procedure Date  . Cataract extraction   . Hemorrhoid surgery   . Tubal ligation   . Polypectomy     due 2015, 3 mm  . Appendectomy     @ tubal  . Tonsillectomy   . Hernia repair 1943    Right Side  . Knee arthroscopy 2007    Left  . Nose surgery 2007    Fractured Nose Reset  . Inguinal hernia repair 11/16/2011    Procedure: HERNIA REPAIR INGUINAL ADULT;  Surgeon: Mariella Saa, MD;  Location: [facility];  Service: General;  Laterality: Left;  with mesh    Family History  Problem Relation Age of Onset  . Depression Mother     manic  . Coronary artery disease Maternal Uncle     X 6  . Cancer Maternal Uncle    intestinal  . Heart attack Mother 70  . COPD Mother   . Breast cancer Paternal Aunt   . Depression      1 M aunt & 2 M uncles    History  Substance Use Topics  . Smoking status: Former Smoker    Quit date: 11/01/1984  . Smokeless tobacco: Never Used     Comment: off/on stopper age 42  . Alcohol Use: 0.5 oz/week    1 drink(s) per week     Comment: whiskey    OB History    Grav Para Term Preterm Abortions TAB SAB Ect Mult Living                  Review of Systems  Constitutional: Negative.   Respiratory: Negative.   Cardiovascular: Negative.   Gastrointestinal: Positive for abdominal pain. Negative for nausea, vomiting, diarrhea, constipation and blood in stool.       Sensation of lower abdominal fullness over the past few days.  Genitourinary: Negative.   Musculoskeletal: Negative.   Skin: Negative.   Neurological: Negative.   Hematological: Negative.   Psychiatric/Behavioral: Negative.     Allergies  Nickel and Amoxicillin-pot clavulanate  Home Medications  Current Outpatient Rx  Name  Route  Sig  Dispense  Refill  . BUPROPION HCL ER (XL) 150 MG PO TB24      TAKE ONE TABLET EVERY OTHER DAY.   30 tablet   0   . CALCIUM + D PO   Oral   Take 600 mg by mouth 2 (two) times daily.          . CRESTOR 20 MG PO TABS      TAKE 1 TABLET ONCE A DAY AS DIRECTED.   30 tablet   1   . CYANOCOBALAMIN 1000 MCG/ML IJ SOLN   Intramuscular   Inject 1,000 mcg into the muscle every 30 (thirty) days.          . CYCLOSPORINE 0.05 % OP EMUL   Both Eyes   Place 1 drop into both eyes at bedtime.          . OMEGA-3 FATTY ACIDS 1000 MG PO CAPS   Oral   Take 1 g by mouth 2 (two) times daily.          Marland Kitchen FOSAMAX 70 MG PO TABS      TAKE 1 TAB ONCE A WK AT LEAST 30 MIN BEFORE 1ST FOOD.DO NOT LIE DOWN FOR 30 MIN AFTER TAKING.   4 each   11   . COSAMIN DS PO   Oral   Take 1 tablet by mouth 2 (two) times daily.           Marland Kitchen LEVOTHYROXINE SODIUM 112 MCG PO  TABS      LABS DUE, 1 by mouth daily, EXCEPT 1 1/2 on Wed   32 tablet   0   . MULTIVITAMINS PO TABS   Oral   Take 1 tablet by mouth daily.           . NORVASC 10 MG PO TABS      TAKE (1/2) TABLET DAILY IN THE MORNING.   30 each   6   . TIMOLOL MALEATE 0.5 % OP SOLN               . TRAZODONE HCL 100 MG PO TABS   Oral   Take 1 tablet (100 mg total) by mouth at bedtime as needed for sleep.   60 tablet   0   . OXYCODONE-ACETAMINOPHEN 5-325 MG PO TABS   Oral   Take 1-2 tablets by mouth once.   20 tablet   0   . TIMOLOL HEMIHYDRATE 0.5 % OP SOLN   Both Eyes   Place 1 drop into both eyes daily.            BP 120/90  Pulse 76  Temp 97.7 F (36.5 C) (Oral)  Resp 20  SpO2 98%  Physical Exam  Constitutional: She is oriented to person, place, and time. She appears well-developed and well-nourished. No distress.  HENT:  Head: Normocephalic and atraumatic.  Mouth/Throat: No oropharyngeal exudate.  Eyes: EOM are normal.  Neck: Normal range of motion. Neck supple.  Cardiovascular: Normal rate and normal heart sounds.   Pulmonary/Chest: Effort normal and breath sounds normal. No respiratory distress.  Abdominal: Soft. There is no tenderness.       Abdomen is soft and bowel sounds are normal. There is tenderness at the abdominal midline approximately 6 cm inferior to the umbilicus. This is the epicenter of pain as it radiates bilaterally. Palpation of this area produces the greatest amount of tenderness.  There is a pulsatile mass  in this area measuring approximately 4 cm across.  Musculoskeletal: Normal range of motion. She exhibits no edema.  Neurological: She is alert and oriented to person, place, and time. No cranial nerve deficit.  Skin: Skin is warm and dry.  Psychiatric: She has a normal mood and affect.    ED Course  Procedures (including critical care time)  Labs Reviewed  POCT URINALYSIS DIP (DEVICE) - Abnormal; Notable for the following:    Ketones,  ur 40 (*)     Hgb urine dipstick SMALL (*)     Leukocytes, UA TRACE (*)  Biochemical Testing Only. Please order routine urinalysis from main lab if confirmatory testing is needed.   All other components within normal limits   No results found.   1. Abdominal pain of unknown etiology       MDM  79 year old female was awakened with abdominal pain last p.m. and has persisted through this morning and tonight. The pain is located across the mid abdomen inferior to the umbilicus. She does not appear acutely ill and has no GI symptoms. I do not have a diagnosis for her abdominal pain but included in the differential would be pain originating from her abdominal aorta. She has a prominent pulsation in the midline abdomen and separated by approximately 4 cm. This is the epicenter of her tenderness. She will be transferred to the emergency department for imaging and further evaluation.        Hayden Rasmussen, NP 11/24/12 2013  Hayden Rasmussen, NP 11/24/12 1610  Hayden Rasmussen, NP 11/24/12 9604  Hayden Rasmussen, NP 11/25/12 1935

## 2012-11-26 LAB — URINE CULTURE

## 2012-11-26 NOTE — ED Provider Notes (Signed)
Medical screening examination/treatment/procedure(s) were performed by non-physician practitioner and as supervising physician I was immediately available for consultation/collaboration.  Leslee Home, M.D.   Reuben Likes, MD 11/26/12 [phone]

## 2012-11-27 ENCOUNTER — Ambulatory Visit (HOSPITAL_COMMUNITY)
Admission: RE | Admit: 2012-11-27 | Discharge: 2012-11-27 | Disposition: A | Discharge status: Home / Self Care | Payer: Medicare Other | Source: Ambulatory Visit | Attending: Emergency Medicine | Admitting: Emergency Medicine

## 2012-11-27 ENCOUNTER — Ambulatory Visit (HOSPITAL_COMMUNITY): Payer: Medicare Other

## 2012-11-27 DIAGNOSIS — R109 Unspecified abdominal pain: Secondary | ICD-10-CM | POA: Diagnosis not present

## 2012-11-27 DIAGNOSIS — R1900 Intra-abdominal and pelvic swelling, mass and lump, unspecified site: Secondary | ICD-10-CM | POA: Diagnosis not present

## 2012-11-27 DIAGNOSIS — R1909 Other intra-abdominal and pelvic swelling, mass and lump: Secondary | ICD-10-CM | POA: Insufficient documentation

## 2012-11-28 ENCOUNTER — Other Ambulatory Visit: Payer: Self-pay | Admitting: Internal Medicine

## 2012-11-28 DIAGNOSIS — S8253XA Displaced fracture of medial malleolus of unspecified tibia, initial encounter for closed fracture: Secondary | ICD-10-CM | POA: Diagnosis not present

## 2012-11-30 DIAGNOSIS — F411 Generalized anxiety disorder: Secondary | ICD-10-CM | POA: Diagnosis not present

## 2012-11-30 DIAGNOSIS — S8253XA Displaced fracture of medial malleolus of unspecified tibia, initial encounter for closed fracture: Secondary | ICD-10-CM | POA: Diagnosis not present

## 2012-12-04 ENCOUNTER — Telehealth: Payer: Self-pay | Admitting: Internal Medicine

## 2012-12-04 NOTE — Telephone Encounter (Signed)
Spoke with patient, patient  States she got her results for U/S via Mychart. Patient was offered to be seen at one of our other [place] locations today and patient refused. Patient states " I am not in severe pain, I would rather wait to see Dr.Hopper" patient scheduled to see Dr.Hopper tomorrow at 10:30 am. Patient aware if pain increases or new symptoms develop prior to office visit to go to the ER or Urgent Care.   Message sent to MD as a Lorain Childes

## 2012-12-04 NOTE — Telephone Encounter (Signed)
Patient Information:  Caller Name: Analeya  Phone: [phone]  Patient: Krista Carlson, Krista Carlson  Gender: Female  DOB: 1932-04-17  Age: 79 Years  PCP: Marga Melnick  Office Follow Up:  Does the office need to follow up with this patient?: Yes  Instructions For The Office: Please  call pt back regarding appt for today for abdominal pain; all appt were full and she is also wanting to know the results of her Ultrasound she had done on 11/27/12.  RN Note:  Attempted to make appt for pt to be seen today but all appt are full.  Instructed pt that message will be sent to the office and she will receive a follow up call. pt voices understanding and instructed tocall on her cell phone at [phone]  Symptoms  Reason For Call & Symptoms: Pt is calling and states that she had an ultrasound on 11/27/12 and she still has not heard from the office regarding the results; pt is still having pain on the left abdominal pain; rates 3/10; no vomiting; diarrhea at times but she has celiac disease;  Reviewed Health History In EMR: Yes  Reviewed Medications In EMR: Yes  Reviewed Allergies In EMR: Yes  Reviewed Surgeries / Procedures: Yes  Date of Onset of Symptoms: 11/27/2012  Guideline(s) Used:  Abdominal Pain - Female  Disposition Per Guideline:   See Today in Office  Reason For Disposition Reached:   Moderate or mild pain that comes and goes (cramps) lasts > 24 hours  Advice Given:  Call Back If:  You become worse.

## 2012-12-05 ENCOUNTER — Encounter: Payer: Self-pay | Admitting: Internal Medicine

## 2012-12-05 ENCOUNTER — Ambulatory Visit (INDEPENDENT_AMBULATORY_CARE_PROVIDER_SITE_OTHER): Payer: Medicare Other | Admitting: Internal Medicine

## 2012-12-05 VITALS — BP 118/76 | HR 48 | Temp 98.1°F | Wt 146.0 lb

## 2012-12-05 DIAGNOSIS — R1032 Left lower quadrant pain: Secondary | ICD-10-CM | POA: Diagnosis not present

## 2012-12-05 DIAGNOSIS — K900 Celiac disease: Secondary | ICD-10-CM

## 2012-12-05 DIAGNOSIS — E785 Hyperlipidemia, unspecified: Secondary | ICD-10-CM | POA: Diagnosis not present

## 2012-12-05 DIAGNOSIS — E876 Hypokalemia: Secondary | ICD-10-CM

## 2012-12-05 DIAGNOSIS — E039 Hypothyroidism, unspecified: Secondary | ICD-10-CM

## 2012-12-05 LAB — LIPID PANEL
Cholesterol: 182 mg/dL (ref 0–200)
LDL Cholesterol: 93 mg/dL (ref 0–99)
Total CHOL/HDL Ratio: 3
Triglycerides: 125 mg/dL (ref 0.0–149.0)
VLDL: 25 mg/dL (ref 0.0–40.0)

## 2012-12-05 LAB — BASIC METABOLIC PANEL
BUN: 13 mg/dL (ref 6–23)
CO2: 29 mEq/L (ref 19–32)
Chloride: 102 mEq/L (ref 96–112)
Creatinine, Ser: 0.7 mg/dL (ref 0.4–1.2)
Potassium: 4 mEq/L (ref 3.5–5.1)

## 2012-12-05 NOTE — Progress Notes (Signed)
  Subjective:    Patient ID: Krista Carlson, female    DOB: 01/14/1932, 79 y.o.   MRN: [id number]  HPI  The UC &  emergency room records 11/24/12 were reviewed. K+ 3.3  & GFR slightly decreased. The urine culture revealed 85,000 colonies of multiple organisms, not a single bacteria. Ultrasound aorta was negative for aneurysm.  Her pain has localized to the left lower quadrant. She feels this may be related to her inguinal hernia. She also has a hx of intrarenal calculi & cystocele which is associated with urinary frequency.    Review of Systems Abdominal pain is in   L  lower  quadrant ; it is described as dull & non radiating . Severity was up to a level 2 ; the discomfort lasts only as long as direct pressureapplied. It is not impacted by change in position,urination , bowel function, or self treatment with Rx pain med from ER . There are no definite exacerbating factors other than direct pressure.  Nausea vomiting, constipation,  melena, or rectal bleeding were not described.Frequenty she has liquid stool. Probiotic did not help. There was no associated dyspepsia, dysphagia, anorexia, or hematemesis. Weight loss of 15 # since 2013 in context of stress of husband's cancer treatment.  Fever, chills, & sweats were not present. Dysuria, pyuria, and hematuria were absent. There was no associated rash or radicular pain in the area of the discomfort.  Past medical history and family history are + for diverticulitis & colon cancer. Dr Laural Benes diagnosed Sprue based on colonic biopsy in 1998. PMH of diverticlosis/ itis. CAT scan of abdomen and pelvis 11/29/11 did reveal a 7 cm probable postoperative seroma. No definite hernia was noted. Diverticulosis was seen without diverticulitis. She also had nonobstructing left renal calculi and bilateral renal parapelvic cysts.  She questions fluid in the left ear             Objective:   Physical Exam General appearance is one of good health and nourishment  w/o distress. Eyes: No conjunctival inflammation or scleral icterus is present.  Ears: Right canal patent with normal TM. Cerumen present on the left Oral exam: Dental hygiene is good; lips and gums are healthy appearing.There is no oropharyngeal erythema or exudate noted.  Heart:  Normal rate and regular rhythm. S1 and S2 normal without gallop, murmur, click, rub or other extra sounds . Lungs:Chest clear to auscultation; no wheezes, rhonchi,rales ,or rubs present.No increased work of breathing.  Abdomen: bowel sounds normal, soft but there is localized tenderness in the left suprapubic area. With cough there appears to be a slight bulge without significant herniation.  No guarding or rebound. Slight tenderness to percussion in the left flank. Rectal : not performed Skin:Warm & dry.  Intact without suspicious lesions or rashes ; no jaundice or tenting. Lymphatic: No lymphadenopathy is noted about the head, neck, axilla, or inguinal areas.           Assessment & Plan:  #1 the discomfort does seem to correlate with a small hernia in the left lower quadrant/suprapubic area. Diverticulitis is not suggested in the absence of fever, chills, sweats, and persistent bowel changes. If the symptoms persist or progress; repeat CT scan would be needed. At this time this is not a surgical situation frankly. The urine culture was nondiagnostic; she is not having symptoms of cystitis or urinary tract infection at this time. #2hypokalemia #3 hypothyroidism Plan: See orders and recommendations

## 2012-12-05 NOTE — Patient Instructions (Addendum)
Please do not use Q-tips as we discussed. Should wax build up occur, please put 2-3 drops of mineral oil in the ear at night and cover the canal with a  cotton ball.In the morning fill the canal with hydrogen peroxide & leave  for 10-15 minutes.Following this shower and use the thinnest washrag available to wick out the wax.   If the left lower quadrant pain persists or progresses; repeat CT scan of the pelvis and abdomen will be pursued as a prelude to possible referral to Dr.Hoxworth. Review and correct the record as indicated. Please share record with all medical staff seen.

## 2012-12-06 ENCOUNTER — Other Ambulatory Visit: Payer: Self-pay | Admitting: *Deleted

## 2012-12-06 NOTE — Telephone Encounter (Signed)
Refill x6 months; recheck B12 low at that time. The interval between shots might be able to be decreased.

## 2012-12-06 NOTE — Telephone Encounter (Signed)
Pt called requesting a refill on her b12 solution. Pt notes that these injection are given to her at home by a nurse friend of hers. Last B12 level done 11-11-11 and was 538  Please advise

## 2012-12-07 NOTE — Telephone Encounter (Signed)
Left message to call office

## 2012-12-08 MED ORDER — CYANOCOBALAMIN 1000 MCG/ML IJ SOLN: 1000.0000 ug | INTRAMUSCULAR | Status: DC

## 2012-12-08 NOTE — Telephone Encounter (Signed)
Discuss with patient, Rx sent. 

## 2012-12-16 ENCOUNTER — Other Ambulatory Visit: Payer: Self-pay

## 2012-12-18 ENCOUNTER — Other Ambulatory Visit: Payer: Self-pay | Admitting: Internal Medicine

## 2012-12-19 NOTE — Telephone Encounter (Signed)
TSH 244.9 

## 2012-12-28 ENCOUNTER — Other Ambulatory Visit: Payer: Self-pay | Admitting: Internal Medicine

## 2012-12-28 DIAGNOSIS — M1710 Unilateral primary osteoarthritis, unspecified knee: Secondary | ICD-10-CM | POA: Diagnosis not present

## 2013-01-15 DIAGNOSIS — H409 Unspecified glaucoma: Secondary | ICD-10-CM | POA: Diagnosis not present

## 2013-01-15 DIAGNOSIS — H4011X0 Primary open-angle glaucoma, stage unspecified: Secondary | ICD-10-CM | POA: Diagnosis not present

## 2013-01-26 ENCOUNTER — Telehealth: Payer: Self-pay | Admitting: Internal Medicine

## 2013-01-26 MED ORDER — TRAZODONE HCL 100 MG PO TABS: ORAL_TABLET | ORAL | Status: DC

## 2013-01-26 NOTE — Telephone Encounter (Signed)
Patient taking 2 by mouth nightly, Hopp please advise if ok to give additional refills since patient is taking EVERY Night

## 2013-01-26 NOTE — Telephone Encounter (Signed)
REFILLS NEEDED ON TRAZODONE 100 MG TABLET # 60  SIG: TAKE 1 OR 2 TABLETS AT BEDTIME AS NEEDED. LAST FILLED : 02.27.2014

## 2013-01-26 NOTE — Telephone Encounter (Signed)
#  60 R X1

## 2013-02-13 ENCOUNTER — Encounter: Payer: Self-pay | Admitting: Internal Medicine

## 2013-02-13 ENCOUNTER — Ambulatory Visit (INDEPENDENT_AMBULATORY_CARE_PROVIDER_SITE_OTHER): Payer: Medicare Other | Admitting: Internal Medicine

## 2013-02-13 VITALS — BP 116/78 | HR 68 | Temp 98.0°F | Resp 14 | Ht 60.0 in | Wt 143.0 lb

## 2013-02-13 DIAGNOSIS — M899 Disorder of bone, unspecified: Secondary | ICD-10-CM

## 2013-02-13 DIAGNOSIS — I10 Essential (primary) hypertension: Secondary | ICD-10-CM | POA: Diagnosis not present

## 2013-02-13 DIAGNOSIS — E538 Deficiency of other specified B group vitamins: Secondary | ICD-10-CM

## 2013-02-13 DIAGNOSIS — Z0000 Encounter for general adult medical examination without abnormal findings: Secondary | ICD-10-CM | POA: Diagnosis not present

## 2013-02-13 DIAGNOSIS — E039 Hypothyroidism, unspecified: Secondary | ICD-10-CM | POA: Diagnosis not present

## 2013-02-13 DIAGNOSIS — E782 Mixed hyperlipidemia: Secondary | ICD-10-CM | POA: Diagnosis not present

## 2013-02-13 LAB — TSH: TSH: 1.09 u[IU]/mL (ref 0.35–5.50)

## 2013-02-13 MED ORDER — TRAZODONE HCL 100 MG PO TABS: ORAL_TABLET | ORAL | Status: DC

## 2013-02-13 NOTE — Progress Notes (Signed)
Subjective:    Patient ID: Krista Carlson, female    DOB: 1932-09-25, 79 y.o.   MRN: [id number]  HPI Medicare Wellness Visit:  Psychosocial & medical history were reviewed as required by Medicare (abuse,antisocial behavioral risks,firearm risk).  Social history: caffeine:1 cola / day  , alcohol:rarely   ,  tobacco use: quit 1986 Exercise : interrupted due to husband's illness No home & personal  safety / fall risk Activities of daily living: no limitations  Seatbelt  and smoke alarm employed. Power of Attorney/Living Will status : in place Ophthalmology exam current Hearing evaluation  current Orientation :oriented X 3  Memory & recall :good Math testing:good Mood & affect : normal . Depression / anxiety: denied Travel history : last 2010 [place]  Immunization status :PNA needed Transfusion history:  none  Preventive health surveillance ( colonoscopy, BMD , mammograms,PAP as per protocol/ [facility]): current  Dental care:  Every 6 mos. Chart reviewed &  Updated. Active issues reviewed & addressed.      Review of Systems  HYPERTENSION follow-up:  Home blood pressure not monitored  Patient is compliant with medications  No adverse effects noted from medication  No regular  exercise program ; husband in Hospice  Gluten free;low-salt diet  No chest pain, palpitations, dyspnea, claudication,edema or paroxysmal nocturnal dyspnea described  No significant lightheadedness, headache, epistaxis, or syncope .  She describes variable numbness, tingling,cold and burning in her feet. This is in the context of B12 deficiency, spinal stenosis, and lumbar radiculopathy.           Objective:   Physical Exam Gen.: Healthy and well-nourished in appearance. Alert, appropriate and cooperative throughout exam. Appears younger than stated age  Head: Normocephalic without obvious abnormalities Eyes: No corneal or conjunctival inflammation noted.  Extraocular motion intact. Vision grossly  normal without lenses Ears: External  ear exam reveals no significant lesions or deformities. Canals clear .TMs normal. Hearing is grossly normal bilaterally. Nose: External nasal exam reveals no deformity or inflammation. Nasal mucosa are pink and moist. No lesions or exudates noted.   Mouth: Oral mucosa and oropharynx reveal no lesions or exudates. Teeth in good repair. Neck: No deformities, masses, or tenderness noted. Range of motion decreased. Thyroid small. Lungs: Normal respiratory effort; chest expands symmetrically. Lungs are clear to auscultation without rales, wheezes, or increased work of breathing. Heart: Normal rate and rhythm. Normal S1 and S2. No gallop, click, or rub. Grade 1/6 systolic murmur Abdomen: Bowel sounds normal; abdomen soft and nontender. No masses, organomegaly or hernias noted. Genitalia: As per Gyn , Dr Aldona Bar                                Musculoskeletal/extremities: Accentuated curvature of upper thoracic spine. No clubbing, cyanosis, edema, or significant extremity  deformity noted. Range of motion normal .Tone & strength  Normal. Joints with minor DJD changes. Nail health good. Able to lie down & sit up w/o help. Negative SLR bilaterally to > 90 degrees Vascular: Carotid, radial artery, dorsalis pedis and  posterior tibial pulses are full and equal. No bruits present. Neurologic: Alert and oriented x3. Deep tendon reflexes symmetrical and normal.      Skin: Intact without suspicious lesions or rashes. Lymph: No cervical, axillary lymphadenopathy present. Psych: Mood and affect are normal. Normally interactive  Assessment & Plan:  #1 Medicare Wellness Exam; criteria met ; data entered #2 Problem List reviewed ; Assessment/ Recommendations made Plan: see Orders

## 2013-02-13 NOTE — Patient Instructions (Addendum)
Review and correct the record as indicated. Please share record with all medical staff seen. Stop Fosamax.Recommended lifestyle interventions to prevent Osteoporosis include calcium 600 mg twice a day  & vitamin D3 supplementation to keep vit D  level @ least 40-60. The usual vitamin D3 dose is 1000 IU daily; but individual dose is determined by annual vitamin D level monitor. Also weight bearing exercise such as  walking 30-45 minutes 3-4  X per week is recommended.

## 2013-02-16 LAB — VITAMIN D 1,25 DIHYDROXY: Vitamin D2 1, 25 (OH)2: <8 pg/mL

## 2013-02-22 ENCOUNTER — Other Ambulatory Visit: Payer: Self-pay | Admitting: Internal Medicine

## 2013-02-27 ENCOUNTER — Telehealth: Payer: Self-pay | Admitting: Internal Medicine

## 2013-02-27 NOTE — Telephone Encounter (Signed)
Please schedule patient for an appointment, she was not recently seen for these concerns

## 2013-02-27 NOTE — Telephone Encounter (Signed)
Patient Information:  Caller Name: Merritt  Phone: [phone]  Patient: Krista Carlson, Krista Carlson  Gender: Female  DOB: [birth year]  Age: 79 Years  PCP: Marga Melnick  Office Follow Up:  Does the office need to follow up with this patient?: Yes  Instructions For The Office: Please follow up with patient regarding any other treatment she may need for productive cough-using Mucinex DM and with husband in hospice,  Drug store of choice is [place].  RN Note:  Patient calls with a cold that has settled in the chest.  Has tried home treatment for past week.  Primarily the main symptoms is a  moderate cough (worse at bedtime) that is dry but slightly productive (small amount of yellow sputum), denies head symptoms. Has resolving sore throat,and afebrile.  Is using Mucinex DM.  She calls concerned because her husband is in hospice at [facility].  She stays with him at night.  Triaged with care advice given.  Patient wants to see if there is anything else she needs given her circumstances with her husband's health and her possible run down. Note sent to office.  Symptoms  Reason For Call & Symptoms: Sore throat with chest cold.  Is coughing up a small amount of yellow sputum. She is concerned regarding her husband in [facility] in Hospice  Reviewed Health History In EMR: Yes  Reviewed Medications In EMR: Yes  Reviewed Allergies In EMR: N/A  Reviewed Surgeries / Procedures: Yes  Date of Onset of Symptoms: 02/20/2013  Treatments Tried: Mucus DM  Treatments Tried Worked: Yes  Guideline(s) Used:  Cough  Disposition Per Guideline:   Home Care  Reason For Disposition Reached:   Cough with cold symptoms (e.g., runny nose, postnasal drip, throat clearing)  Advice Given:  Reassurance  You can get a dry hacking cough after a chest cold. Sometimes this type of cough can last 1-3 weeks, and be worse at night.  Here is some care advice that should help.  Cough Medicines:  Home Remedy - Hard Candy: Hard  candy works just as well as medicine-flavored OTC cough drops. Diabetics should use sugar-free candy.  OTC Cough Syrup - Dextromethorphan:  Cough syrups containing the cough suppressant dextromethorphan (DM) may help decrease your cough. Cough syrups work best for coughs that keep you awake at night. They can also sometimes help in the late stages of a respiratory infection when the cough is dry and hacking. They can be used along with cough drops.  Coughing Spasms:  Drink warm fluids. Inhale warm mist (Reason: both relax the airway and loosen up the phlegm).  Suck on cough drops or hard candy to coat the irritated throat.  Prevent Dehydration:  Drink adequate liquids.  Avoid Tobacco Smoke:  Smoking or being exposed to smoke makes coughs much worse.  Expected Course:   The expected course depends on what is causing the cough.  Viral bronchitis (chest cold) causes a cough that lasts 1 to 3 weeks. Sometimes you may cough up lots of phlegm (sputum, mucus). The mucus can normally be white, gray, yellow, or green.  Call Back If:  Difficulty breathing  Cough lasts more than 3 weeks  Fever lasts > 3 days  You become worse.  For a Stuffy Nose - Use Nasal Washes:  Introduction: Saline (salt water) nasal irrigation (nasal wash) is an effective and simple home remedy for treating stuffy nose and sinus congestion. The nose can be irrigated by pouring, spraying, or squirting salt water into  the nose and then letting it run back out.  Patient Will Follow Care Advice:  YES

## 2013-02-28 ENCOUNTER — Ambulatory Visit (INDEPENDENT_AMBULATORY_CARE_PROVIDER_SITE_OTHER): Payer: Medicare Other | Admitting: Internal Medicine

## 2013-02-28 ENCOUNTER — Telehealth: Payer: Self-pay | Admitting: Internal Medicine

## 2013-02-28 ENCOUNTER — Encounter: Payer: Self-pay | Admitting: Internal Medicine

## 2013-02-28 VITALS — BP 140/96 | HR 68 | Temp 99.1°F | Resp 16 | Wt 141.2 lb

## 2013-02-28 DIAGNOSIS — J209 Acute bronchitis, unspecified: Secondary | ICD-10-CM | POA: Diagnosis not present

## 2013-02-28 MED ORDER — AZITHROMYCIN 500 MG PO TABS: 500.0000 mg | ORAL_TABLET | Freq: Every day | ORAL | Status: DC

## 2013-02-28 MED ORDER — HYDROCOD POLST-CPM POLST ER 10-8 MG PO CP12: 1.0000 | ORAL_CAPSULE | Freq: Two times a day (BID) | ORAL | Status: DC | PRN

## 2013-02-28 NOTE — Telephone Encounter (Signed)
Patient scheduled to see Sanda Linger at [facility] today 4/30/30214 at 4:00 pm

## 2013-02-28 NOTE — Patient Instructions (Signed)

## 2013-02-28 NOTE — Assessment & Plan Note (Signed)
Will start zpak for the infection and a cough suppressant 

## 2013-02-28 NOTE — Telephone Encounter (Signed)
I called patient, patient was not interested in traveling to [facility], patient questioned if another office that would be a little closer to her had any availability. Patient is scheduled to see Dr.Thomas Yetta Barre at [facility], patient praised this new process of scheduling with other offices to avoid urgent care.

## 2013-02-28 NOTE — Telephone Encounter (Signed)
Pt is calling back in reference to call on 02/27/13.  Pt was requesting medication for cold symptoms.  Pt spouse is at hospice and pt is concerned about her being sick and being around him.  RN viewed call in EPIC and there was a response that pt would need to be seen.  NO appts are available for today.  Pt is upset that she was not called back yesterday for an appt.   OFFICE PLEASE FOLLOW UP WITH PT IF SHE CAN BE SEEN TODAY.  PT HAS A SCHEDULING CONFLICT FROM 1300 - 1530.

## 2013-02-28 NOTE — Telephone Encounter (Signed)
Melissa at the [facility] office has several openings, please offer patient to be seen with Melissa at [facility]

## 2013-02-28 NOTE — Progress Notes (Addendum)
Subjective:    Patient ID: Krista Carlson, female    DOB: February 20, 1932, 79 y.o.   MRN: [id number]  Cough This is a new problem. The current episode started in the past 7 days. The problem has been unchanged. The problem occurs every few hours. The cough is productive of purulent sputum. Associated symptoms include chills and a sore throat. Pertinent negatives include no chest pain, ear congestion, ear pain, fever, headaches, heartburn, hemoptysis, myalgias, nasal congestion, postnasal drip, rash, rhinorrhea, shortness of breath, sweats, weight loss or wheezing. Nothing aggravates the symptoms. She has tried nothing for the symptoms. There is no history of asthma, bronchiectasis, bronchitis, COPD, emphysema, environmental allergies or pneumonia.      Review of Systems  Constitutional: Positive for chills. Negative for fever, weight loss, diaphoresis, activity change, appetite change, fatigue and unexpected weight change.  HENT: Positive for sore throat. Negative for ear pain, congestion, rhinorrhea, trouble swallowing, voice change, postnasal drip and sinus pressure.   Eyes: Negative.   Respiratory: Positive for cough. Negative for hemoptysis, choking, chest tightness, shortness of breath, wheezing and stridor.   Cardiovascular: Negative.  Negative for chest pain, palpitations and leg swelling.  Gastrointestinal: Negative.  Negative for heartburn, nausea, vomiting, abdominal pain, diarrhea and constipation.  Endocrine: Negative.   Genitourinary: Negative.   Musculoskeletal: Negative.  Negative for myalgias, back pain, joint swelling and gait problem.  Skin: Negative.  Negative for rash.  Allergic/Immunologic: Negative.  Negative for environmental allergies.  Neurological: Negative.  Negative for headaches.  Hematological: Negative for adenopathy. Does not bruise/bleed easily.  Psychiatric/Behavioral: Negative.        Objective:   Physical Exam  Vitals reviewed. Constitutional: She is  oriented to person, place, and time. She appears well-developed and well-nourished.  Non-toxic appearance. She does not have a sickly appearance. She does not appear ill. No distress.  HENT:  Head: Normocephalic and atraumatic. No trismus in the jaw.  Mouth/Throat: Oropharynx is clear and moist and mucous membranes are normal. Mucous membranes are not pale, not dry and not cyanotic. No oral lesions. No edematous. No oropharyngeal exudate, posterior oropharyngeal edema, posterior oropharyngeal erythema or tonsillar abscesses.  Eyes: Conjunctivae are normal. Right eye exhibits no discharge. Left eye exhibits no discharge. No scleral icterus.  Neck: Normal range of motion. Neck supple. No JVD present. No tracheal deviation present. No thyromegaly present.  Cardiovascular: Normal rate, regular rhythm, S1 normal, S2 normal and intact distal pulses.  Exam reveals no gallop, no S3, no S4 and no friction rub.   Murmur heard.  Decrescendo systolic murmur is present with a grade of 2/6   No diastolic murmur is present  Pulses:      Carotid pulses are 1+ on the right side, and 1+ on the left side.      Radial pulses are 1+ on the right side, and 1+ on the left side.       Femoral pulses are 1+ on the right side, and 1+ on the left side.      Popliteal pulses are 1+ on the right side, and 1+ on the left side.       Dorsalis pedis pulses are 1+ on the right side, and 1+ on the left side.       Posterior tibial pulses are 1+ on the right side, and 1+ on the left side.  Pulmonary/Chest: Effort normal and breath sounds normal. No stridor. No respiratory distress. She has no wheezes. She has no rales. She exhibits no tenderness.  Abdominal: Soft. Bowel sounds are normal. She exhibits no distension and no mass. There is no tenderness. There is no rebound and no guarding.  Musculoskeletal: Normal range of motion. She exhibits no edema and no tenderness.  Lymphadenopathy:    She has no cervical adenopathy.   Neurological: She is oriented to person, place, and time.  Skin: Skin is warm and dry. No rash noted. She is not diaphoretic. No erythema. No pallor.  Psychiatric: She has a normal mood and affect. Her behavior is normal. Judgment and thought content normal.     Lab Results  Component Value Date   WBC 8.6 11/24/2012   HGB 13.7 11/24/2012   HCT 39.4 11/24/2012   PLT 235 11/24/2012   GLUCOSE 98 12/05/2012   CHOL 182 12/05/2012   TRIG 125.0 12/05/2012   HDL 63.90 12/05/2012   LDLDIRECT 165.2 09/11/2009   LDLCALC 93 12/05/2012   ALT 15 11/24/2012   AST 15 11/24/2012   NA 138 12/05/2012   K 4.0 12/05/2012   CL 102 12/05/2012   CREATININE 0.7 12/05/2012   BUN 13 12/05/2012   CO2 29 12/05/2012   TSH 1.09 02/13/2013   HGBA1C 5.2 12/01/2007       Assessment & Plan:

## 2013-03-01 NOTE — Progress Notes (Signed)
  Subjective:    Patient ID: Krista Carlson, female    DOB: [birth year], 79 y.o.   MRN: [id number]  HPI    Review of Systems     Objective:   Physical Exam        Assessment & Plan:

## 2013-03-02 ENCOUNTER — Telehealth: Payer: Self-pay | Admitting: Internal Medicine

## 2013-03-02 NOTE — Telephone Encounter (Signed)
Pt was seen by Dr Yetta Barre on 02/28/13, pt is not any better. She does have appt for [facility]. Request call back.

## 2013-03-02 NOTE — Telephone Encounter (Signed)
Patient on ABX x 3 days (took last pill today) and taking cough medication as prescribe.  Patient is still with the exact same symptoms as on 02/28/2013. Patient would like to know if Dr.Jones would rx her something else vs her having to be seen at Saturday clinic. Patient's pharmacy was verified as [facility]   Per Dr.Hopper this will need to be follow-up by Dr.John's assistant for he seen patient.

## 2013-03-03 ENCOUNTER — Encounter: Payer: Self-pay | Admitting: Family Medicine

## 2013-03-03 ENCOUNTER — Ambulatory Visit (INDEPENDENT_AMBULATORY_CARE_PROVIDER_SITE_OTHER): Payer: Medicare Other | Admitting: Family Medicine

## 2013-03-03 VITALS — BP 122/80 | Temp 98.7°F | Wt 140.0 lb

## 2013-03-03 DIAGNOSIS — J069 Acute upper respiratory infection, unspecified: Secondary | ICD-10-CM | POA: Insufficient documentation

## 2013-03-03 MED ORDER — HYDROCODONE-HOMATROPINE 5-1.5 MG/5ML PO SYRP: 5.0000 mL | ORAL_SOLUTION | Freq: Three times a day (TID) | ORAL | Status: DC | PRN

## 2013-03-03 NOTE — Progress Notes (Signed)
  Subjective:    Patient ID: Krista Carlson, female    DOB: 10-Oct-1932, 79 y.o.   MRN: [id number]  HPI Krista Carlson is a 79 year old female nonsmoker,,,,,,,, her husband [name] is in hospice and stage lung cancer,,,, who comes in today for evaluation of a cough  She states she's had head congestion postnasal drip and cough for about 12 days. She was seen here last week and given antibiotic but it hasn't helped  No fever no sputum production. She's concerned about giving this cold to her husband   Review of Systems    review of systems negative Objective:   Physical Exam Well-developed and nourished female no acute distress HEENT negative neck was supple no adenopathy lungs are clear       Assessment & Plan:  Viral syndrome plan treat symptomatically with increased fluids Hydromet

## 2013-03-03 NOTE — Patient Instructions (Addendum)
Drink lots of water  Hydromet,,,,,, 1/2-1 teaspoon twice a day when necessary for cough and cold

## 2013-03-05 NOTE — Telephone Encounter (Signed)
LMOVM advising nothing else needed per PCP

## 2013-03-05 NOTE — Telephone Encounter (Signed)
She does not need anything else

## 2013-03-29 DIAGNOSIS — B301 Conjunctivitis due to adenovirus: Secondary | ICD-10-CM | POA: Diagnosis not present

## 2013-04-04 ENCOUNTER — Other Ambulatory Visit: Payer: Self-pay | Admitting: Internal Medicine

## 2013-04-13 DIAGNOSIS — H10509 Unspecified blepharoconjunctivitis, unspecified eye: Secondary | ICD-10-CM | POA: Diagnosis not present

## 2013-04-23 ENCOUNTER — Telehealth: Payer: Self-pay | Admitting: Internal Medicine

## 2013-04-23 MED ORDER — BUPROPION HCL ER (XL) 150 MG PO TB24: ORAL_TABLET | ORAL | Status: DC

## 2013-04-23 MED ORDER — AMLODIPINE BESYLATE 5 MG PO TABS: ORAL_TABLET | ORAL | Status: DC

## 2013-04-23 MED ORDER — LEVOTHYROXINE SODIUM 112 MCG PO TABS: ORAL_TABLET | ORAL | Status: DC

## 2013-04-23 MED ORDER — ROSUVASTATIN CALCIUM 20 MG PO TABS: 20.0000 mg | ORAL_TABLET | Freq: Every day | ORAL | Status: DC

## 2013-04-23 NOTE — Telephone Encounter (Signed)
Refill done.  

## 2013-04-25 DIAGNOSIS — H10509 Unspecified blepharoconjunctivitis, unspecified eye: Secondary | ICD-10-CM | POA: Diagnosis not present

## 2013-05-01 DIAGNOSIS — F411 Generalized anxiety disorder: Secondary | ICD-10-CM | POA: Diagnosis not present

## 2013-05-29 DIAGNOSIS — H4011X0 Primary open-angle glaucoma, stage unspecified: Secondary | ICD-10-CM | POA: Diagnosis not present

## 2013-05-29 DIAGNOSIS — H10509 Unspecified blepharoconjunctivitis, unspecified eye: Secondary | ICD-10-CM | POA: Diagnosis not present

## 2013-05-29 DIAGNOSIS — H409 Unspecified glaucoma: Secondary | ICD-10-CM | POA: Diagnosis not present

## 2013-06-01 ENCOUNTER — Ambulatory Visit (INDEPENDENT_AMBULATORY_CARE_PROVIDER_SITE_OTHER): Payer: Medicare Other | Admitting: Internal Medicine

## 2013-06-01 ENCOUNTER — Encounter: Payer: Self-pay | Admitting: Internal Medicine

## 2013-06-01 VITALS — BP 132/82 | HR 79 | Temp 98.6°F | Resp 12 | Wt 137.0 lb

## 2013-06-01 DIAGNOSIS — R05 Cough: Secondary | ICD-10-CM | POA: Diagnosis not present

## 2013-06-01 DIAGNOSIS — H04129 Dry eye syndrome of unspecified lacrimal gland: Secondary | ICD-10-CM | POA: Diagnosis not present

## 2013-06-01 DIAGNOSIS — H04123 Dry eye syndrome of bilateral lacrimal glands: Secondary | ICD-10-CM | POA: Insufficient documentation

## 2013-06-01 DIAGNOSIS — K117 Disturbances of salivary secretion: Secondary | ICD-10-CM | POA: Diagnosis not present

## 2013-06-01 DIAGNOSIS — H6992 Unspecified Eustachian tube disorder, left ear: Secondary | ICD-10-CM

## 2013-06-01 DIAGNOSIS — H6990 Unspecified Eustachian tube disorder, unspecified ear: Secondary | ICD-10-CM

## 2013-06-01 DIAGNOSIS — E538 Deficiency of other specified B group vitamins: Secondary | ICD-10-CM

## 2013-06-01 DIAGNOSIS — R059 Cough, unspecified: Secondary | ICD-10-CM

## 2013-06-01 DIAGNOSIS — K900 Celiac disease: Secondary | ICD-10-CM

## 2013-06-01 MED ORDER — FLUTICASONE PROPIONATE 50 MCG/ACT NA SUSP: 1.0000 | Freq: Two times a day (BID) | NASAL | Status: DC | PRN

## 2013-06-01 NOTE — Patient Instructions (Addendum)
Flonase 1 spray in each nostril twice a day as needed. Use the "crossover" technique as discussed Order for x-rays entered into  the computer; these will be performed at [address] [facility]. across from [facility]. No appointment is necessary. Share results with all non [place] medical staff seen

## 2013-06-01 NOTE — Progress Notes (Signed)
Subjective:    Patient ID: Krista Carlson, female    DOB: 04-12-1932, 79 y.o.   MRN: [id number]  HPI   She's had a cough since April of this year. She was treated for sore throat and cough at the Saturday clinic 4/30 with a Z-Pak. On 03/15/13 narcotic cough syrup wasRxed with some benefit.  The cough has persisted almost daily on a frequency of 2-4 times per day. The sputum is scant & nonpurulent.  She states the pattern is that she will cough twice and then sneeze. She's had some watery eyes as well. She describes some pressure in the left ear without discharge. She describes some minor sore throat & post nasal drainage. She has some xerostomia.     She does have a past medical history of celiac disease. She denies any significant arthritic conditions. She is not on ACE inhibitor.  Her mother had COPD  She smoked for approximately 33 years "socially", never more than half a pack a day. Review of Systems  The cough is not associated with shortness of breath or wheezing  She is not having frontal headache, facial pain, or nasal purulence.  She has had no rash or significant arthritic signs or symptoms. She also denies dysuria, hematuria, or pyuria.  She denies significant reflux or dyspepsia. She does describe hoarseness but no dysphagia.  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 Subsequently she took doxycycline once a day 6/25-7/25 from Dr. Allena Katz. She's also using 2 lubricating drops in her eyes as well as warm compresses. There's been no diagnosis of uveitis.  She also has B12 deficiency has been receiving monthly injections. Her last B12 level was 1288 on April 15; this is supernormal value..  She has been evaluated for peripheral neuropathy; there was no evidence of diabetes and no etiology defined. She does have history spinal stenosis     Objective:   Physical Exam  Gen.:  well-nourished in appearance. Alert, appropriate and cooperative throughout exam. Appears younger than stated age   Eyes: No corneal or conjunctival inflammation noted. No icterus. Ears: External  ear exam reveals no significant lesions or deformities. Canals clear .TMs normal. Hearing is grossly normal bilaterally. Nose: External nasal exam reveals no deformity or inflammation. Nasal mucosa are pink and moist. No lesions or exudates noted. Septum deviated slightly to R Mouth: Oral mucosa and oropharynx reveal no lesions or exudates. Teeth in good repair. Neck: No deformities, masses, or tenderness noted.  Thyroid normal. Hyoid prominent Lungs: Normal respiratory effort; chest expands symmetrically. Lungs are clear to auscultation without rales, wheezes, or increased work of breathing. Heart: Normal rate and rhythm. Normal S1 and S2. No gallop, click, or rub. S4 w/o murmur.                             Musculoskeletal/extremities: Accentuated curvature of upper thoracic  Spine. No clubbing, cyanosis, edema, or significant extremity  deformity noted. Tone & strength  Normal. Joints  reveal mixed PIP & DIP changes. Nail health good.  Neurologic: Alert and oriented x3.         Skin: Intact without suspicious lesions or rashes. No malar rash Lymph: No cervical, axillary lymphadenopathy present. Psych: Mood and affect are normal. Normally interactive  Assessment & Plan:  #1 protracted cough without clinical evidence of reactive airways disease or significant reflux as etiology  #2 xero- ophthalmia  #3 probable left eustachian tube dysfunction  #4 PIP/DIP finger changes suggesting mixed arthritic process  #5 history of gluten enteropathy  #6 B12 deficiency , corrected. She'll be asked to discuss decreasing the frequency to B12 shots with Dr. Daphine Deutscher, her gastroenterologist.  Plan and discussion:  No significant pulmonary process is suggested; the cough may be related to the postnasal drainage but of concern would be drying of the airway. Sjogren's syndrome must be ruled out.  Eustachian tube symptoms will be treated and rheumatologic referral made. Screening rhematologic testing will be performed as baseline

## 2013-06-02 LAB — RHEUMATOID FACTOR: Rhuematoid fact SerPl-aCnc: <10 IU/mL (ref <=14)

## 2013-06-04 LAB — ANA: Anti Nuclear Antibody(ANA): NEGATIVE

## 2013-06-06 ENCOUNTER — Ambulatory Visit (INDEPENDENT_AMBULATORY_CARE_PROVIDER_SITE_OTHER)
Admission: RE | Admit: 2013-06-06 | Discharge: 2013-06-06 | Disposition: A | Discharge status: Home / Self Care | Payer: Medicare Other | Source: Ambulatory Visit | Attending: Internal Medicine | Admitting: Internal Medicine

## 2013-06-06 ENCOUNTER — Other Ambulatory Visit: Payer: Self-pay

## 2013-06-06 ENCOUNTER — Encounter: Payer: Self-pay | Admitting: Internal Medicine

## 2013-06-06 DIAGNOSIS — R05 Cough: Secondary | ICD-10-CM | POA: Diagnosis not present

## 2013-06-06 NOTE — Telephone Encounter (Signed)
I do not see where patient is labeled as being diabetic , Hopp please advise

## 2013-06-22 DIAGNOSIS — H409 Unspecified glaucoma: Secondary | ICD-10-CM | POA: Diagnosis not present

## 2013-06-22 DIAGNOSIS — H4011X0 Primary open-angle glaucoma, stage unspecified: Secondary | ICD-10-CM | POA: Diagnosis not present

## 2013-06-28 DIAGNOSIS — R05 Cough: Secondary | ICD-10-CM | POA: Diagnosis not present

## 2013-06-28 DIAGNOSIS — M19049 Primary osteoarthritis, unspecified hand: Secondary | ICD-10-CM | POA: Diagnosis not present

## 2013-06-28 DIAGNOSIS — M3500 Sicca syndrome, unspecified: Secondary | ICD-10-CM | POA: Diagnosis not present

## 2013-06-28 DIAGNOSIS — K219 Gastro-esophageal reflux disease without esophagitis: Secondary | ICD-10-CM | POA: Diagnosis not present

## 2013-07-04 ENCOUNTER — Encounter: Payer: Self-pay | Admitting: Internal Medicine

## 2013-07-04 ENCOUNTER — Ambulatory Visit (INDEPENDENT_AMBULATORY_CARE_PROVIDER_SITE_OTHER): Payer: Medicare Other | Admitting: Internal Medicine

## 2013-07-04 VITALS — BP 128/85 | HR 74 | Temp 98.3°F | Wt 137.8 lb

## 2013-07-04 DIAGNOSIS — J029 Acute pharyngitis, unspecified: Secondary | ICD-10-CM | POA: Diagnosis not present

## 2013-07-04 DIAGNOSIS — R05 Cough: Secondary | ICD-10-CM

## 2013-07-04 DIAGNOSIS — J020 Streptococcal pharyngitis: Secondary | ICD-10-CM | POA: Diagnosis not present

## 2013-07-04 MED ORDER — AZITHROMYCIN 250 MG PO TABS: ORAL_TABLET | ORAL | Status: DC

## 2013-07-04 NOTE — Patient Instructions (Addendum)
Rest, fluids , tylenol For cough, take Mucinex DM twice a day as needed  Take the antibiotic as prescribed  (zithromax) if the culture come back + , will call you    --- Omeprazole 20 mg OTC one tablet before breakfast for one month, let Dr Alwyn Ren know if cough not better in 2 -3 weeks

## 2013-07-04 NOTE — Progress Notes (Signed)
  Subjective:    Patient ID: Krista Carlson, female    DOB: [birth year], 79 y.o.   MRN: [id number]  HPI Acute visit Sore throat since yesterday. She is concerned because has a social event in 2 days . Also having cough  for a few months, see assessment and plan.   Past Medical History  Diagnosis Date  . Osteopenia   . Aortic heart murmur   . Diverticulitis of colon   . Glaucoma   . History of colonic polyps     3 mm, Dr Laural Benes  . Hypothyroidism   . Spinal stenosis   . Hypertension   . Heart murmur   . Anemia   . Glaucoma 2012  . Arthritis   . Diarrhea   . Kidney stone   . Celiac disease     Dr Laural Benes  . Depression    Past Surgical History  Procedure Laterality Date  . Cataract extraction    . Hemorrhoid surgery    . Tubal ligation    . Polypectomy  2010    due 2015, 3 mm  . Appendectomy      @ tubal  . Tonsillectomy    . Hernia repair  1943    Right Side  . Knee arthroscopy  2007    Left  . Nose surgery  2007    Fractured Nose Reset  . Inguinal hernia repair  11/16/2011    Procedure: HERNIA REPAIR INGUINAL ADULT;  Surgeon: Mariella Saa, MD;  Location: [facility];  Service: General;  Laterality: Left;  with mesh  . Colon biopsy  1998    Celiac Sprue; Dr Laural Benes  . Colonoscopy  2010    Tics& polyp    Review of Systems Denies fever or chills Mild  runny nose, worse than baseline. Mild postnasal dripping. Denies GERD type of symptoms. No wheezing.     Objective:   Physical Exam General -- alert, well-developed, NAD.  HEENT-- TMs normal, throat symmetric, minimal  Redness, no discharge. Face symmetric, sinuses not tender to palpation. Nose slt congested. Lungs -- normal respiratory effort, no intercostal retractions, no accessory muscle use, and normal breath sounds.  Heart-- normal rate, regular rhythm, no murmur.   Extremities-- no pretibial edema bilaterally   Psych-- Cognition and judgment appear intact. Alert and cooperative with normal attention  span and concentration. not anxious appearing and not depressed appearing.     Assessment & Plan:  Pharyngitis Patient presents with sore throat for one day, throat is slightly red but otherwise normal.  Strep test (-) Plan: Tylenol-fluids Check a throat Cx , abx if + (Zpack Rx in the pharmacy in case she needs it )  Cough, Ongoing symptoms for a few months, PCP aware of cough, chest x-ray last month normal. She was sent to rheumatology with question of Sjogren's, patient reports today evaluation was negative. Was recommended Prilosec but is not taking it. Advised to start PPIs and call PCP if no better in 2-3 weeks

## 2013-07-05 ENCOUNTER — Encounter: Payer: Self-pay | Admitting: Internal Medicine

## 2013-07-06 LAB — CULTURE, GROUP A STREP: Organism ID, Bacteria: NORMAL

## 2013-07-13 DIAGNOSIS — H4011X0 Primary open-angle glaucoma, stage unspecified: Secondary | ICD-10-CM | POA: Diagnosis not present

## 2013-07-13 DIAGNOSIS — H409 Unspecified glaucoma: Secondary | ICD-10-CM | POA: Diagnosis not present

## 2013-07-13 NOTE — Addendum Note (Signed)
Addended by: Arnette Norris on: 07/13/2013 11:36 AM   Modules accepted: Orders

## 2013-07-26 ENCOUNTER — Institutional Professional Consult (permissible substitution): Payer: Medicare Other | Admitting: Internal Medicine

## 2013-07-27 ENCOUNTER — Ambulatory Visit (INDEPENDENT_AMBULATORY_CARE_PROVIDER_SITE_OTHER): Payer: Medicare Other | Admitting: Internal Medicine

## 2013-07-27 ENCOUNTER — Encounter: Payer: Self-pay | Admitting: Internal Medicine

## 2013-07-27 VITALS — BP 130/78 | HR 95 | Temp 98.3°F | Ht 60.0 in | Wt 138.0 lb

## 2013-07-27 DIAGNOSIS — R05 Cough: Secondary | ICD-10-CM

## 2013-07-27 DIAGNOSIS — Z23 Encounter for immunization: Secondary | ICD-10-CM

## 2013-07-27 MED ORDER — PREDNISONE (PAK) 10 MG PO TABS: ORAL_TABLET | ORAL | Status: DC

## 2013-07-27 MED ORDER — OMEPRAZOLE 20 MG PO CPDR: DELAYED_RELEASE_CAPSULE | ORAL | Status: DC

## 2013-07-27 MED ORDER — BENZONATATE 200 MG PO CAPS: ORAL_CAPSULE | ORAL | Status: DC

## 2013-07-27 NOTE — Progress Notes (Signed)
Subjective:    Patient ID: Krista Carlson, female    DOB: [birth year] MRN: [id number]  HPI  80 yowf quit smoking in1986 no resp problems sick since April 2014 with abrupt onset persistent cough and sore throat referred 07/27/2013 by Dr Alwyn Ren to pulmonary clinic.  07/27/2013 1st [facility] office visit/ Wert cc daily cough x 5 months mostly dry better when asleep and recurs within an hour of stirring  esp if talking assoc with scratchy throat and hoarsenss. abx not helpful, was seen for ? sjogren's by rheum with neg w/u but rec ppi but not really taking it consistently.       Kouffman Reflux v Neurogenic Cough Differentiator Reflux Comments  Do you awaken from a sound sleep coughing violently?                            With trouble breathing? no   Do you have choking episodes when you cannot  Get enough air, gasping for air ?              no   Do you usually cough when you lie down into  The bed, or when you just lie down to rest ?                          no   Do you usually cough after meals or eating?         no   Do you cough when (or after) you bend over?    no   GERD SCORE     Kouffman Reflux v Neurogenic Cough Differentiator Neurogenic   Do you more-or-less cough all day long? yes   Does change of temperature make you cough? no   Does laughing or chuckling cause you to cough? yes   Do fumes (perfume, automobile fumes, burned  Toast, etc.,) cause you to cough ?      No    Does speaking, singing, or talking on the phone cause you to cough   ?               yes   Neurogenic/Airway score       No obvious day to day or daytime variabilty or assoc sob cp or chest tightness, subjective wheeze overt sinus or hb symptoms. No unusual exp hx or h/o childhood pna/ asthma or knowledge of premature birth.  Sleeping ok without nocturnal  or early am exacerbation  of respiratory  c/o's or need for noct saba. Also denies any obvious fluctuation of symptoms with weather or environmental  changes or other aggravating or alleviating factors except as outlined above   Current Medications, Allergies, Complete Past Medical History, Past Surgical History, Family History, and Social History were reviewed in [employer] record.            Review of Systems  Constitutional: Negative for fever, chills and unexpected weight change.  HENT: Positive for sore throat. Negative for ear pain, nosebleeds, congestion, rhinorrhea, sneezing, trouble swallowing, dental problem, voice change, postnasal drip and sinus pressure.   Eyes: Negative for visual disturbance.  Respiratory: Positive for cough. Negative for choking and shortness of breath.   Cardiovascular: Negative for chest pain and leg swelling.  Gastrointestinal: Negative for vomiting, abdominal pain and diarrhea.  Genitourinary: Negative for difficulty urinating.  Musculoskeletal: Negative for arthralgias.  Skin: Negative for rash.  Neurological: Negative for tremors, syncope  and headaches.  Hematological: Does not bruise/bleed easily.       Objective:   Physical Exam  amb wf nad very pleasant but somewhat evasive with questions  Wt Readings from Last 3 Encounters:  07/27/13 138 lb (62.596 kg)  07/04/13 137 lb 12.8 oz (62.506 kg)  06/01/13 137 lb (62.143 kg)     HEENT: nl dentition, turbinates, and orophanx. Nl external ear canals without cough reflex   NECK :  without JVD/Nodes/TM/ nl carotid upstrokes bilaterally   LUNGS: no acc muscle use, clear to A and P bilaterally without cough on insp or exp maneuvers   CV:  RRR  no s3 or murmur or increase in P2, no edema   ABD:  soft and nontender with nl excursion in the supine position. No bruits or organomegaly, bowel sounds nl  MS:  warm without deformities, calf tenderness, cyanosis or clubbing  SKIN: warm and dry without lesions    NEURO:  alert, approp, no deficits    cxr 06/06/13  Increased lung volumes since 2012. No acute  cardiopulmonary  abnormality.     Assessment & Plan:

## 2013-07-27 NOTE — Patient Instructions (Addendum)
Omeprazole 20 mg Take 30-60 min before first meal of the day and Pepcid 20 mg one at bedtime  Tessalon pearls every 6 hours as needed   Prednisone 10 mg take  4 each am x 2 days,   2 each am x 2 days,  1 each am x 2 days and stop   GERD (REFLUX)  is an extremely common cause of respiratory symptoms, many times with no significant heartburn at all.    It can be treated with medication, but also with lifestyle changes including avoidance of late meals, excessive alcohol, smoking cessation, and avoid fatty foods, chocolate, peppermint, colas, red wine, and acidic juices such as orange juice.  NO MINT OR MENTHOL PRODUCTS SO NO COUGH DROPS  USE SUGARLESS CANDY INSTEAD (jolley ranchers or Stover's or lifefesaver)   NO OIL BASED VITAMINS - use powdered substitutes - stop fish oil for now and just eat more salmon  Please schedule a follow up office visit in 4 weeks, sooner if needed  (ok to cancel if cough gone but tell your friends!)

## 2013-07-28 DIAGNOSIS — R05 Cough: Secondary | ICD-10-CM | POA: Insufficient documentation

## 2013-07-28 NOTE — Assessment & Plan Note (Signed)
The most common causes of chronic cough in immunocompetent adults include the following: upper airway cough syndrome (UACS), previously referred to as postnasal drip syndrome (PNDS), which is caused by variety of rhinosinus conditions; (2) asthma; (3) GERD; (4) chronic bronchitis from cigarette smoking or other inhaled environmental irritants; (5) nonasthmatic eosinophilic bronchitis; and (6) bronchiectasis.   These conditions, singly or in combination, have accounted for up to 94% of the causes of chronic cough in prospective studies.   Other conditions have constituted no >6% of the causes in prospective studies These have included bronchogenic carcinoma, chronic interstitial pneumonia, sarcoidosis, left ventricular failure, ACEI-induced cough, and aspiration from a condition associated with pharyngeal dysfunction.    Chronic cough is often simultaneously caused by more than one condition. A single cause has been found from 38 to 82% of the time, multiple causes from 18 to 62%. Multiply caused cough has been the result of three diseases up to 42% of the time.      Most likely this is  Classic Upper airway cough syndrome, so named because it's frequently impossible to sort out how much is  CR/sinusitis with freq throat clearing (which can be related to primary GERD)   vs  causing  secondary (" extra esophageal")  GERD from wide swings in gastric pressure that occur with throat clearing, often  promoting self use of mint and menthol lozenges that reduce the lower esophageal sphincter tone and exacerbate the problem further in a cyclical fashion.   These are the same pts (now being labeled as having "irritable larynx syndrome" by some cough centers) who not infrequently have a history of having failed to tolerate ace inhibitors,  dry powder inhalers or biphosphonates or report having atypical reflux symptoms that don't respond to standard doses of PPI , and are easily confused as having aecopd or asthma  flares by even experienced allergists/ pulmonologists.   rec cyclical cough regimen, then regroup in 4 weeks, with max rx of gerd in meantime.

## 2013-07-30 ENCOUNTER — Encounter: Payer: Self-pay | Admitting: Internal Medicine

## 2013-08-30 DIAGNOSIS — N200 Calculus of kidney: Secondary | ICD-10-CM | POA: Diagnosis not present

## 2013-08-31 DIAGNOSIS — D239 Other benign neoplasm of skin, unspecified: Secondary | ICD-10-CM | POA: Diagnosis not present

## 2013-08-31 DIAGNOSIS — L820 Inflamed seborrheic keratosis: Secondary | ICD-10-CM | POA: Diagnosis not present

## 2013-08-31 DIAGNOSIS — L821 Other seborrheic keratosis: Secondary | ICD-10-CM | POA: Diagnosis not present

## 2013-08-31 DIAGNOSIS — L708 Other acne: Secondary | ICD-10-CM | POA: Diagnosis not present

## 2013-08-31 DIAGNOSIS — L723 Sebaceous cyst: Secondary | ICD-10-CM | POA: Diagnosis not present

## 2013-09-05 ENCOUNTER — Ambulatory Visit (INDEPENDENT_AMBULATORY_CARE_PROVIDER_SITE_OTHER): Payer: Medicare Other | Admitting: Internal Medicine

## 2013-09-05 ENCOUNTER — Encounter: Payer: Self-pay | Admitting: Internal Medicine

## 2013-09-05 VITALS — BP 114/80 | HR 67 | Temp 98.4°F | Ht 60.0 in | Wt 136.0 lb

## 2013-09-05 DIAGNOSIS — R059 Cough, unspecified: Secondary | ICD-10-CM

## 2013-09-05 DIAGNOSIS — R05 Cough: Secondary | ICD-10-CM | POA: Diagnosis not present

## 2013-09-05 NOTE — Progress Notes (Signed)
Subjective:    Patient ID: Krista Carlson, female    DOB: 04-Dec-1931 MRN: [id number]    Brief patient profile:  81 yowf quit smoking in1986 no resp problems sick since April 2014 with abrupt onset persistent cough and sore throat referred 07/27/2013 by Dr Alwyn Ren to pulmonary clinic.   History of Present Illness  07/27/2013 1st [facility] office visit/ Krista Carlson cc daily cough x 5 months mostly dry better when asleep and recurs within an hour of stirring  esp if talking assoc with scratchy throat and hoarsenss. abx not helpful, was seen for ? sjogren's by rheum with neg w/u but rec ppi but not really taking it consistently.       Krista Carlson v Neurogenic Cough Differentiator Carlson Comments  Do you awaken from a sound sleep coughing violently?                            With trouble breathing? no   Do you have choking episodes when you cannot  Get enough air, gasping for air ?              no   Do you usually cough when you lie down into  The bed, or when you just lie down to rest ?                          no   Do you usually cough after meals or eating?         no   Do you cough when (or after) you bend over?    no   GERD SCORE     Krista Carlson v Neurogenic Cough Differentiator Neurogenic   Do you more-or-less cough all day long? yes   Does change of temperature make you cough? no   Does laughing or chuckling cause you to cough? yes   Do fumes (perfume, automobile fumes, burned  Toast, etc.,) cause you to cough ?      No    Does speaking, singing, or talking on the phone cause you to cough   ?               yes   Neurogenic/Airway score      rec Omeprazole 20 mg Take 30-60 min before first meal of the day and Pepcid 20 mg one at bedtime Tessalon pearls every 6 hours as needed  Prednisone 10 mg take  4 each am x 2 days,   2 each am x 2 days,  1 each am x 2 days and stop  GERD diet  09/05/2013 f/u ov/Krista Carlson re: chronic cough  Chief Complaint  Patient presents with  .  Follow-up    Pt states cough is much improved since the last visit. She c/o white coating on her tongue and "tingling feeling"-unsure if this is related to any of her meds.      No obvious day to day or daytime variabilty or assoc sob  or cp or chest tightness, subjective wheeze overt sinus or hb symptoms. No unusual exp hx or h/o childhood pna/ asthma or knowledge of premature birth.  Sleeping ok without nocturnal  or early am exacerbation  of respiratory  c/o's or need for noct saba. Also denies any obvious fluctuation of symptoms with weather or environmental changes or other aggravating or alleviating factors except as outlined above   Current Medications, Allergies, Complete Past Medical History, Past  Surgical History, Family History, and Social History were reviewed in [employer] record.  ROS  The following are not active complaints unless bolded sore throat, dysphagia, dental problems, itching, sneezing,  nasal congestion or excess/ purulent secretions, ear ache,   fever, chills, sweats, unintended wt loss, pleuritic or exertional cp, hemoptysis,  orthopnea pnd or leg swelling, presyncope, palpitations, heartburn, abdominal pain, anorexia, nausea, vomiting, diarrhea  or change in bowel or urinary habits, change in stools or urine, dysuria,hematuria,  rash, arthralgias, visual complaints, headache, numbness weakness or ataxia or problems with walking or coordination,  change in mood/affect or memory.                      Objective:   Physical Exam  amb wf nad very pleasant but somewhat evasive with answers to specific questions  Wt Readings from Last 3 Encounters:  09/05/13 136 lb (61.689 kg)  07/27/13 138 lb (62.596 kg)  07/04/13 137 lb 12.8 oz (62.506 kg)      HEENT: nl dentition, turbinates, and orophanx. Tongue is nl .  Nl external ear canals without cough reflex   NECK :  without JVD/Nodes/TM/ nl carotid upstrokes bilaterally   LUNGS: no acc  muscle use, clear to A and P bilaterally without cough on insp or exp maneuvers   CV:  RRR  no s3 or murmur or increase in P2, no edema   ABD:  soft and nontender with nl excursion in the supine position. No bruits or organomegaly, bowel sounds nl  MS:  warm without deformities, calf tenderness, cyanosis or clubbing  SKIN: warm and dry without lesions    NEURO:  alert, approp, no deficits    cxr 06/06/13  Increased lung volumes since 2012. No acute cardiopulmonary  abnormality.     Assessment & Plan:

## 2013-09-05 NOTE — Patient Instructions (Signed)
Omeprazole 20 mg Take 30-60 min before first meal of the day and Pepcid 20 mg one at bedtime x 6 weeks then try off   If you are satisfied with your treatment plan let your doctor know and he/she can either refill your medications or you can return here when your prescription runs out.     If in any way you are not 100% satisfied,  please tell us.  If 100% better, tell your friends!

## 2013-09-06 DIAGNOSIS — H409 Unspecified glaucoma: Secondary | ICD-10-CM | POA: Diagnosis not present

## 2013-09-06 DIAGNOSIS — H4011X0 Primary open-angle glaucoma, stage unspecified: Secondary | ICD-10-CM | POA: Diagnosis not present

## 2013-09-07 NOTE — Assessment & Plan Note (Addendum)
-   initial eval 07/27/13 > rec cyclical cough regimen > resolved 09/05/13   I had an extended summary  discussion with the patient today lasting 15 to 20 minutes of a 25 minute visit on the following issues:   Never clear in these pts whether the GERD was the primary or the secondary (gerd from wide swings in gastric pressure from coughing from a primary source) so ok to taper off gerd rx after 6 weeks and f/u prn     Each maintenance medication was reviewed in detail including most importantly the difference between maintenance and as needed and under what circumstances the prns are to be used.  Please see instructions for details which were reviewed in writing and the patient given a copy.

## 2013-09-13 ENCOUNTER — Other Ambulatory Visit: Payer: Self-pay

## 2013-09-13 ENCOUNTER — Other Ambulatory Visit: Payer: Self-pay | Admitting: Internal Medicine

## 2013-09-13 DIAGNOSIS — Z1231 Encounter for screening mammogram for malignant neoplasm of breast: Secondary | ICD-10-CM

## 2013-10-01 ENCOUNTER — Other Ambulatory Visit: Payer: Self-pay | Admitting: Internal Medicine

## 2013-10-02 NOTE — Telephone Encounter (Signed)
Bupropion refilled per protocol 

## 2013-10-15 ENCOUNTER — Ambulatory Visit: Payer: Medicare Other

## 2013-11-03 ENCOUNTER — Other Ambulatory Visit: Payer: Self-pay | Admitting: Internal Medicine

## 2013-11-05 ENCOUNTER — Other Ambulatory Visit: Payer: Self-pay

## 2013-11-05 NOTE — Telephone Encounter (Signed)
Trazodone refilled per protocol. JG//CMA 

## 2013-11-06 ENCOUNTER — Ambulatory Visit: Payer: Medicare Other

## 2013-11-19 ENCOUNTER — Other Ambulatory Visit: Payer: Self-pay | Admitting: Internal Medicine

## 2013-11-19 DIAGNOSIS — Z1211 Encounter for screening for malignant neoplasm of colon: Secondary | ICD-10-CM

## 2013-11-19 NOTE — Progress Notes (Signed)
You are welcome. The information was already in the chart, just needed to do a little research and follow through. :)

## 2013-11-22 ENCOUNTER — Ambulatory Visit
Admission: RE | Admit: 2013-11-22 | Discharge: 2013-11-22 | Disposition: A | Discharge status: Home / Self Care | Payer: Medicare Other | Source: Ambulatory Visit

## 2013-11-22 DIAGNOSIS — Z1231 Encounter for screening mammogram for malignant neoplasm of breast: Secondary | ICD-10-CM | POA: Diagnosis not present

## 2013-12-12 ENCOUNTER — Other Ambulatory Visit: Payer: Self-pay | Admitting: Internal Medicine

## 2013-12-17 NOTE — Telephone Encounter (Signed)
Rx sent to the pharmacy by e-script.//AB/CMA 

## 2014-01-01 ENCOUNTER — Encounter: Payer: Medicare Other | Admitting: Gastroenterology

## 2014-01-07 ENCOUNTER — Other Ambulatory Visit: Payer: Self-pay | Admitting: Internal Medicine

## 2014-01-07 DIAGNOSIS — H4011X0 Primary open-angle glaucoma, stage unspecified: Secondary | ICD-10-CM | POA: Diagnosis not present

## 2014-01-07 DIAGNOSIS — H409 Unspecified glaucoma: Secondary | ICD-10-CM | POA: Diagnosis not present

## 2014-02-04 DIAGNOSIS — N200 Calculus of kidney: Secondary | ICD-10-CM | POA: Diagnosis not present

## 2014-02-04 DIAGNOSIS — N390 Urinary tract infection, site not specified: Secondary | ICD-10-CM | POA: Diagnosis not present

## 2014-02-04 DIAGNOSIS — M545 Low back pain, unspecified: Secondary | ICD-10-CM | POA: Diagnosis not present

## 2014-02-07 ENCOUNTER — Other Ambulatory Visit: Payer: Self-pay | Admitting: Internal Medicine

## 2014-02-07 NOTE — Telephone Encounter (Signed)
Rx sent to the pharmacy by e-script.  Pt needs complete physical and fasting labs.//AB/CMA 

## 2014-02-21 ENCOUNTER — Encounter: Payer: Self-pay | Admitting: Internal Medicine

## 2014-02-21 ENCOUNTER — Ambulatory Visit (INDEPENDENT_AMBULATORY_CARE_PROVIDER_SITE_OTHER): Payer: Medicare Other | Admitting: Internal Medicine

## 2014-02-21 ENCOUNTER — Other Ambulatory Visit (INDEPENDENT_AMBULATORY_CARE_PROVIDER_SITE_OTHER): Payer: Medicare Other

## 2014-02-21 VITALS — BP 134/82 | HR 67 | Temp 98.1°F | Resp 14 | Wt 135.2 lb

## 2014-02-21 DIAGNOSIS — E538 Deficiency of other specified B group vitamins: Secondary | ICD-10-CM

## 2014-02-21 DIAGNOSIS — K900 Celiac disease: Secondary | ICD-10-CM

## 2014-02-21 DIAGNOSIS — G8929 Other chronic pain: Secondary | ICD-10-CM

## 2014-02-21 DIAGNOSIS — E039 Hypothyroidism, unspecified: Secondary | ICD-10-CM

## 2014-02-21 DIAGNOSIS — Z860100 Personal history of colon polyps, unspecified: Secondary | ICD-10-CM

## 2014-02-21 DIAGNOSIS — G629 Polyneuropathy, unspecified: Secondary | ICD-10-CM

## 2014-02-21 DIAGNOSIS — G609 Hereditary and idiopathic neuropathy, unspecified: Secondary | ICD-10-CM

## 2014-02-21 DIAGNOSIS — N8184 Pelvic muscle wasting: Secondary | ICD-10-CM | POA: Diagnosis not present

## 2014-02-21 DIAGNOSIS — M6289 Other specified disorders of muscle: Secondary | ICD-10-CM

## 2014-02-21 DIAGNOSIS — R1032 Left lower quadrant pain: Secondary | ICD-10-CM

## 2014-02-21 LAB — VITAMIN B12: Vitamin B-12: 545 pg/mL (ref 211–911)

## 2014-02-21 LAB — TSH: TSH: 1.27 u[IU]/mL (ref 0.35–5.50)

## 2014-02-21 MED ORDER — GABAPENTIN 100 MG PO CAPS: ORAL_CAPSULE | ORAL | Status: DC

## 2014-02-21 NOTE — Progress Notes (Signed)
Pre visit review using our clinic review tool, if applicable. No additional management support is needed unless otherwise documented below in the visit note. 

## 2014-02-21 NOTE — Progress Notes (Signed)
Subjective:    Patient ID: Krista Carlson, female    DOB: 10/18/1932, 80 y.o.   MRN: [id number]  HPI She presents with several concerns. Her chief concern is a sensation that she needs to have a bowel movement or urinate. This is described as a pressure type sensation in the perineal and vaginal areas. This is been present for at least five years. Her gynecologist, Dr Stann Mainland, had diagnosed cystocoele and possibly prolapse. She is gravida 6,P4 & abortus two. Her only gynecologic surgery was tubal ligation. She also has pain in left inguinal area where hernia was repaired in 2013. She questions tenderness and swelling in the neck. She has intermittent tingling in the mid-anterior tongue. Recently she had severe pain in both mid-to upper flank areas. She was evaluated by urology and found have no active kidney stones or urine tract infection. The pain resolved without treatment. She was seen by Dr. Risa Grill. She also has numbness and tingling her feet and ankles. This is been present for at least four years. This is in the context of B12 deficiency for which she takes monthly injections. The symptoms are increased with ambulation. She has celiac disease; her last colonoscopy was  2010. She's received notification from Dr. Wynetta Emery that follow-up colonoscopy is indicated. She attends yoga class II - four times per week.    Review of Systems She  denies significant  dyspepsia, dysphagia, unexplained weight loss,  melena, rectal bleeding, or small caliber stools. Intermittent loose stool w/o frank diarrhea . Dysuria, pyuria,or  hematuria are absent. No associated back pain with radiation anteriorly. No associated change in color or temperature of the skin in area of symptoms. No associated rash or skin lesions present. Abnormal bruising or bleeding not present.     Objective:   Physical Exam Gen.: Healthy and well-nourished in appearance. Alert, appropriate and cooperative throughout exam. Appears  much younger than stated age  Head: Normocephalic without obvious abnormalities; no alopecia  Eyes: No corneal or conjunctival inflammation noted. Pupils equal round reactive to light and accommodation. Extraocular motion intact. Nose: External nasal exam reveals no deformity or inflammation. Nasal mucosa are pink and moist. No lesions or exudates noted.   Mouth: Oral mucosa and oropharynx reveal no lesions or exudates. Teeth in good repair. Tongue normal in appearance Neck: No deformities, masses, or tenderness noted. Range of motion &Thyroid  normal Lungs: Normal respiratory effort; chest expands symmetrically. Lungs are clear to auscultation without rales, wheezes, or increased work of breathing. Heart: Normal rate and rhythm. Normal S1 and S2. No gallop, click, or rub. No murmur. Abdomen: Bowel sounds normal; abdomen soft and nontender. No masses, organomegaly or hernias noted. Genitalia: as per Gyn                                  Musculoskeletal/extremities: No deformity or scoliosis noted of  the thoracic or lumbar spine.   No clubbing, cyanosis, edema, or significant extremity  deformity noted. Range of motion normal .Tone & strength normal. Hand joints normal Fingernail  health good. Able to lie down & sit up w/o help. Negative SLR bilaterally to 90 degrees ! Vascular: Carotid, radial artery, dorsalis pedis and  posterior tibial pulses are full and equal. No bruits present. Neurologic: Alert and oriented x3. Deep tendon reflexes symmetrical and normal.  Gait normal  Skin: Intact without suspicious lesions or rashes. Slight tenting Lymph: No cervical, axillary lymphadenopathy present.Prominent  submandibular glands which are symmetric, nontender, and physiologic. Easily palpable carotid bodies. No pathologic cervical lymphadenopathy present clinically Psych: Mood and affect are normal. Normally interactive                                                                                         Assessment & Plan:  #1Perineal/vaginal pressure; this may be related to her  cystocoele or prolapse. If follow-up  GI evaluation is negative; I recommended evaluation and treatment in the urology clinic for pelvic floor disorder #2 left lower quadrant/annual pain. This may be related to her previous hernia repair. Follow-up GI evaluation recommended to R/O colon issue #3 prominent submandibular glands with no suggestion of pathologic lymphadenopathy #4 tingling of the tongue; this may relate to her B12 deficiency which is being treated #5 numbness and tingling in the feet; titration of gabapentin at bedtime recommended as a trial See orders & AVS

## 2014-02-21 NOTE — Patient Instructions (Addendum)
Your next office appointment will be determined based upon review of your pending labs . Those instructions will be transmitted to you through My Chart   The  referral to Dr Howell Rucks will be scheduled and you'll be notified of the time.

## 2014-03-12 DIAGNOSIS — F411 Generalized anxiety disorder: Secondary | ICD-10-CM | POA: Diagnosis not present

## 2014-03-26 ENCOUNTER — Other Ambulatory Visit: Payer: Self-pay

## 2014-03-26 MED ORDER — GABAPENTIN 300 MG PO CAPS: 300.0000 mg | ORAL_CAPSULE | Freq: Every day | ORAL | Status: DC

## 2014-03-28 ENCOUNTER — Other Ambulatory Visit: Payer: Self-pay | Admitting: Gastroenterology

## 2014-03-28 DIAGNOSIS — R197 Diarrhea, unspecified: Secondary | ICD-10-CM | POA: Diagnosis not present

## 2014-03-28 DIAGNOSIS — K900 Celiac disease: Secondary | ICD-10-CM | POA: Diagnosis not present

## 2014-03-28 DIAGNOSIS — Z86010 Personal history of colonic polyps: Secondary | ICD-10-CM | POA: Diagnosis not present

## 2014-04-09 ENCOUNTER — Other Ambulatory Visit: Payer: Self-pay | Admitting: Internal Medicine

## 2014-04-09 NOTE — Telephone Encounter (Signed)
OK X1 

## 2014-04-13 ENCOUNTER — Other Ambulatory Visit: Payer: Self-pay | Admitting: Internal Medicine

## 2014-05-02 ENCOUNTER — Other Ambulatory Visit: Payer: Self-pay | Admitting: Internal Medicine

## 2014-05-02 NOTE — Telephone Encounter (Signed)
OK X1 

## 2014-05-13 DIAGNOSIS — H04129 Dry eye syndrome of unspecified lacrimal gland: Secondary | ICD-10-CM | POA: Diagnosis not present

## 2014-05-13 DIAGNOSIS — H4011X0 Primary open-angle glaucoma, stage unspecified: Secondary | ICD-10-CM | POA: Diagnosis not present

## 2014-05-13 DIAGNOSIS — H409 Unspecified glaucoma: Secondary | ICD-10-CM | POA: Diagnosis not present

## 2014-05-15 ENCOUNTER — Other Ambulatory Visit: Payer: Self-pay | Admitting: Internal Medicine

## 2014-06-03 ENCOUNTER — Encounter (HOSPITAL_COMMUNITY): Payer: Self-pay | Admitting: Pharmacy Technician

## 2014-06-05 ENCOUNTER — Encounter (HOSPITAL_COMMUNITY): Payer: Self-pay | Admitting: Pharmacy Technician

## 2014-06-06 ENCOUNTER — Other Ambulatory Visit: Payer: Self-pay | Admitting: Internal Medicine

## 2014-06-06 NOTE — Telephone Encounter (Signed)
4.23.15 last office visit

## 2014-06-06 NOTE — Telephone Encounter (Signed)
OK X1 

## 2014-06-10 ENCOUNTER — Encounter (HOSPITAL_COMMUNITY): Payer: Self-pay | Admitting: *Deleted

## 2014-06-17 ENCOUNTER — Other Ambulatory Visit: Payer: Self-pay | Admitting: Internal Medicine

## 2014-06-25 ENCOUNTER — Encounter (HOSPITAL_COMMUNITY): Payer: Self-pay | Admitting: *Deleted

## 2014-06-25 ENCOUNTER — Ambulatory Visit (HOSPITAL_COMMUNITY)
Admission: RE | Admit: 2014-06-25 | Discharge: 2014-06-25 | Disposition: A | Discharge status: Home / Self Care | Payer: Medicare Other | Source: Ambulatory Visit | Attending: Gastroenterology | Admitting: Gastroenterology

## 2014-06-25 ENCOUNTER — Ambulatory Visit (HOSPITAL_COMMUNITY): Payer: Medicare Other | Admitting: Anesthesiology

## 2014-06-25 ENCOUNTER — Encounter (HOSPITAL_COMMUNITY)
Admission: RE | Disposition: A | Discharge status: Home / Self Care | Payer: Self-pay | Source: Ambulatory Visit | Attending: Gastroenterology

## 2014-06-25 ENCOUNTER — Encounter (HOSPITAL_COMMUNITY): Payer: Medicare Other | Admitting: Anesthesiology

## 2014-06-25 DIAGNOSIS — M810 Age-related osteoporosis without current pathological fracture: Secondary | ICD-10-CM | POA: Diagnosis not present

## 2014-06-25 DIAGNOSIS — E039 Hypothyroidism, unspecified: Secondary | ICD-10-CM | POA: Insufficient documentation

## 2014-06-25 DIAGNOSIS — R197 Diarrhea, unspecified: Secondary | ICD-10-CM | POA: Insufficient documentation

## 2014-06-25 DIAGNOSIS — D131 Benign neoplasm of stomach: Secondary | ICD-10-CM | POA: Insufficient documentation

## 2014-06-25 DIAGNOSIS — Z880 Allergy status to penicillin: Secondary | ICD-10-CM | POA: Diagnosis not present

## 2014-06-25 DIAGNOSIS — E785 Hyperlipidemia, unspecified: Secondary | ICD-10-CM | POA: Insufficient documentation

## 2014-06-25 DIAGNOSIS — F3289 Other specified depressive episodes: Secondary | ICD-10-CM | POA: Insufficient documentation

## 2014-06-25 DIAGNOSIS — K900 Celiac disease: Secondary | ICD-10-CM | POA: Insufficient documentation

## 2014-06-25 DIAGNOSIS — Z87891 Personal history of nicotine dependence: Secondary | ICD-10-CM | POA: Diagnosis not present

## 2014-06-25 DIAGNOSIS — D1330 Benign neoplasm of unspecified part of small intestine: Secondary | ICD-10-CM | POA: Diagnosis not present

## 2014-06-25 DIAGNOSIS — I10 Essential (primary) hypertension: Secondary | ICD-10-CM | POA: Diagnosis not present

## 2014-06-25 DIAGNOSIS — F411 Generalized anxiety disorder: Secondary | ICD-10-CM | POA: Insufficient documentation

## 2014-06-25 DIAGNOSIS — Z860100 Personal history of colon polyps, unspecified: Secondary | ICD-10-CM | POA: Insufficient documentation

## 2014-06-25 DIAGNOSIS — E538 Deficiency of other specified B group vitamins: Secondary | ICD-10-CM | POA: Diagnosis not present

## 2014-06-25 DIAGNOSIS — F329 Major depressive disorder, single episode, unspecified: Secondary | ICD-10-CM | POA: Insufficient documentation

## 2014-06-25 DIAGNOSIS — D126 Benign neoplasm of colon, unspecified: Secondary | ICD-10-CM | POA: Diagnosis not present

## 2014-06-25 DIAGNOSIS — K5730 Diverticulosis of large intestine without perforation or abscess without bleeding: Secondary | ICD-10-CM | POA: Insufficient documentation

## 2014-06-25 DIAGNOSIS — Z1211 Encounter for screening for malignant neoplasm of colon: Secondary | ICD-10-CM | POA: Insufficient documentation

## 2014-06-25 HISTORY — PX: COLONOSCOPY WITH PROPOFOL: SHX5780

## 2014-06-25 HISTORY — PX: ESOPHAGOGASTRODUODENOSCOPY (EGD) WITH PROPOFOL: SHX5813

## 2014-06-25 SURGERY — ESOPHAGOGASTRODUODENOSCOPY (EGD) WITH PROPOFOL
Anesthesia: Monitor Anesthesia Care

## 2014-06-25 MED ORDER — LIDOCAINE HCL (CARDIAC) 20 MG/ML IV SOLN
INTRAVENOUS | Status: DC | PRN
  Administered 2014-06-25: 100 mg via INTRAVENOUS

## 2014-06-25 MED ORDER — LIDOCAINE HCL (CARDIAC) 20 MG/ML IV SOLN
INTRAVENOUS | Status: AC
  Filled 2014-06-25: qty 5

## 2014-06-25 MED ORDER — SODIUM CHLORIDE 0.9 % IV SOLN: INTRAVENOUS | Status: DC

## 2014-06-25 MED ORDER — ONDANSETRON HCL 4 MG/2ML IJ SOLN
INTRAMUSCULAR | Status: AC
  Filled 2014-06-25: qty 2

## 2014-06-25 MED ORDER — PROPOFOL 10 MG/ML IV BOLUS
INTRAVENOUS | Status: AC
  Filled 2014-06-25: qty 20

## 2014-06-25 MED ORDER — ONDANSETRON HCL 4 MG/2ML IJ SOLN
INTRAMUSCULAR | Status: DC | PRN
  Administered 2014-06-25: 4 mg via INTRAVENOUS

## 2014-06-25 MED ORDER — BUTAMBEN-TETRACAINE-BENZOCAINE 2-2-14 % EX AERO
INHALATION_SPRAY | CUTANEOUS | Status: DC | PRN
  Administered 2014-06-25: 1 via TOPICAL

## 2014-06-25 MED ORDER — KETAMINE HCL 10 MG/ML IJ SOLN
INTRAMUSCULAR | Status: DC | PRN
  Administered 2014-06-25: 20 mg via INTRAVENOUS

## 2014-06-25 MED ORDER — LACTATED RINGERS IV SOLN
INTRAVENOUS | Status: DC
  Administered 2014-06-25: 09:00:00 via INTRAVENOUS

## 2014-06-25 MED ORDER — PROPOFOL INFUSION 10 MG/ML OPTIME
INTRAVENOUS | Status: DC | PRN
  Administered 2014-06-25: 120 ug/kg/min via INTRAVENOUS

## 2014-06-25 SURGICAL SUPPLY — 25 items

## 2014-06-25 NOTE — H&P (Signed)
  Problems: Personal history of adenomatous colon polyps removed colonoscopically. Celiac disease. Watery diarrhea despite consuming a gluten-free diet.  History: The patient is an 80 year old female born 07-19-1932. The patient has celiac disease and consumes a gluten-free diet. Despite consuming a gluten-free diet, the patient intermittently has watery, nonbloody diarrhea.  In 2010, the patient underwent a colonoscopy with removal of a diminutive adenomatous colon polyp. Random colon biopsies did not show microscopic colitis.  The patient is scheduled to undergo a surveillance colonoscopy and diagnostic esophagogastroduodenoscopy with small bowel biopsies.  Medication allergies: Augmentin. Nickel.  Past medical history: Hypothyroidism. Hypercholesterolemia. Osteoporosis. Chronic depression. Hypertension. Vitamin B12 deficiency. Colonic diverticulosis. Lumbar radiculopathy. Tonsillectomy. Abdominal wall hernia repair. Tubal ligation. Hemorrhoidectomy. Left knee arthroscopy.  Exam: The patient is alert and lying comfortably on the endoscopy stretcher. Lungs are clear to auscultation. Cardiac exam reveals a regular rhythm. Abdomen is soft and nontender to palpation.  Plan: Proceed with surveillance colonoscopy and diagnostic esophagogastroduodenoscopy with the performance of small bowel biopsies.

## 2014-06-25 NOTE — Discharge Instructions (Signed)
Esophagogastroduodenoscopy °Care After °Refer to this sheet in the next few weeks. These instructions provide you with information on caring for yourself after your procedure. Your caregiver may also give you more specific instructions. Your treatment has been planned according to current medical practices, but problems sometimes occur. Call your caregiver if you have any problems or questions after your procedure.  °HOME CARE INSTRUCTIONS °· Do not eat or drink anything until the numbing medicine (local anesthetic) has worn off and your gag reflex has returned. You will know that the local anesthetic has worn off when you can swallow comfortably. °· Do not drive for 12 hours after the procedure or as directed by your caregiver. °· Only take medicines as directed by your caregiver. °SEEK MEDICAL CARE IF:  °· You cannot stop coughing. °· You are not urinating at all or less than usual. °SEEK IMMEDIATE MEDICAL CARE IF: °· You have difficulty swallowing. °· You cannot eat or drink. °· You have worsening throat or chest pain. °· You have dizziness, lightheadedness, or you faint. °· You have nausea or vomiting. °· You have chills. °· You have a fever. °· You have severe abdominal pain. °· You have black, tarry, or bloody stools. °Document Released: 10/04/2012 Document Reviewed: 10/04/2012 °Exit[employer]® Patient Information ©2015 [employer]. This information is not intended to replace advice given to you by your health care provider. Make sure you discuss any questions you have with your health care provider. °Colonoscopy, Care After °These instructions give you information on caring for yourself after your procedure. Your doctor may also give you more specific instructions. Call your doctor if you have any problems or questions after your procedure. °HOME CARE °· Do not drive for 24 hours. °· Do not sign important papers or use machinery for 24 hours. °· You may shower. °· You may go back to your usual activities, but go  slower for the first 24 hours. °· Take rest breaks often during the first 24 hours. °· Walk around or use warm packs on your belly (abdomen) if you have belly cramping or gas. °· Drink enough fluids to keep your pee (urine) clear or pale yellow. °· Resume your normal diet. Avoid heavy or fried foods. °· Avoid drinking alcohol for 24 hours or as told by your doctor. °· Only take medicines as told by your doctor. °If a tissue sample (biopsy) was taken during the procedure:  °· Do not take aspirin or blood thinners for 7 days, or as told by your doctor. °· Do not drink alcohol for 7 days, or as told by your doctor. °· Eat soft foods for the first 24 hours. °GET HELP IF: °You still have a small amount of blood in your poop (stool) 2-3 days after the procedure. °GET HELP RIGHT AWAY IF: °· You have more than a small amount of blood in your poop. °· You see clumps of tissue (blood clots) in your poop. °· Your belly is puffy (swollen). °· You feel sick to your stomach (nauseous) or throw up (vomit). °· You have a fever. °· You have belly pain that gets worse and medicine does not help. °MAKE SURE YOU: °· Understand these instructions. °· Will watch your condition. °· Will get help right away if you are not doing well or get worse. °Document Released: 11/20/2010 Document Revised: 10/23/2013 Document Reviewed: 06/25/2013 °Exit[employer]® Patient Information ©2015 [employer]. This information is not intended to replace advice given to you by your health care provider. Make sure you   discuss any questions you have with your health care provider. ° °

## 2014-06-25 NOTE — Anesthesia Preprocedure Evaluation (Addendum)
Anesthesia Evaluation  Patient identified by MRN, date of birth, ID band Patient awake    Reviewed: Allergy & Precautions, H&P , NPO status , Patient's Chart, lab work & pertinent test results  Airway Mallampati: II TM Distance: >3 FB Neck ROM: Full    Dental  (+) Caps, Dental Advisory Given All front are veneers:   Pulmonary neg pulmonary ROS, former smoker,  breath sounds clear to auscultation  Pulmonary exam normal       Cardiovascular Exercise Tolerance: Good hypertension, Pt. on medications + Valvular Problems/Murmurs Rhythm:Regular Rate:Normal     Neuro/Psych PSYCHIATRIC DISORDERS Anxiety Depression glaucoma  Neuromuscular disease negative neurological ROS  negative psych ROS   GI/Hepatic negative GI ROS, Neg liver ROS, GERD-  Medicated and Controlled,  Endo/Other  negative endocrine ROSHypothyroidism   Renal/GU negative Renal ROS  negative genitourinary   Musculoskeletal negative musculoskeletal ROS (+)   Abdominal   Peds negative pediatric ROS (+)  Hematology negative hematology ROS (+)   Anesthesia Other Findings   Reproductive/Obstetrics negative OB ROS                          Anesthesia Physical Anesthesia Plan  ASA: III  Anesthesia Plan: MAC   Post-op Pain Management:    Induction:   Airway Management Planned:   Additional Equipment:   Intra-op Plan:   Post-operative Plan:   Informed Consent: I have reviewed the patients History and Physical, chart, labs and discussed the procedure including the risks, benefits and alternatives for the proposed anesthesia with the patient or authorized representative who has indicated his/her understanding and acceptance.   Dental Advisory Given  Plan Discussed with: CRNA and Surgeon  Anesthesia Plan Comments:         Anesthesia Quick Evaluation

## 2014-06-25 NOTE — Anesthesia Postprocedure Evaluation (Signed)
  Anesthesia Post-op Note  Patient: Krista Carlson  Procedure(s) Performed: Procedure(s) (LRB): ESOPHAGOGASTRODUODENOSCOPY (EGD) WITH PROPOFOL (N/A) COLONOSCOPY WITH PROPOFOL (N/A)  Patient Location: PACU  Anesthesia Type: MAC  Level of Consciousness: awake and alert   Airway and Oxygen Therapy: Patient Spontanous Breathing  Post-op Pain: mild  Post-op Assessment: Post-op Vital signs reviewed, Patient's Cardiovascular Status Stable, Respiratory Function Stable, Patent Airway and No signs of Nausea or vomiting  Last Vitals:  Filed Vitals:   06/25/14 1132  BP: 136/59  Pulse: 53  Temp:   Resp: 13    Post-op Vital Signs: stable   Complications: No apparent anesthesia complications

## 2014-06-25 NOTE — Transfer of Care (Signed)
Immediate Anesthesia Transfer of Care Note  Patient: Krista Carlson  Procedure(s) Performed: Procedure(s) (LRB): ESOPHAGOGASTRODUODENOSCOPY (EGD) WITH PROPOFOL (N/A) COLONOSCOPY WITH PROPOFOL (N/A)  Patient Location: PACU  Anesthesia Type: MAC  Level of Consciousness: sedated, patient cooperative and responds to stimulation  Airway & Oxygen Therapy: Patient Spontanous Breathing and Patient connected to face mask oxgen  Post-op Assessment: Report given to PACU RN and Post -op Vital signs reviewed and stable  Post vital signs: Reviewed and stable  Complications: No apparent anesthesia complications

## 2014-06-25 NOTE — Op Note (Signed)
Problems: Personal history of adenomatous colon polyps removed colonoscopically. Celiac disease. Intermittent watery diarrhea despite consuming a gluten-free diet. 2010 colonoscopy performed with removal of a diminutive adenomatous colon polyp; random colon biopsies did not show microscopic colitis  Endoscopist: Earle Gell.  Premedication: Propofol administered by anesthesia  Procedure: Diagnostic esophagogastroduodenoscopy with small bowel biopsy The patient was placed in the left lateral decubitus position. The Pentax gastroscope was passed through the posterior hypopharynx into the proximal esophagus without difficulty. The hypopharynx, larynx, and vocal cords appeared normal.  Esophagoscopy: The proximal, mid, and lower segments of the esophageal mucosa appeared normal. The squamocolumnar junction was regular in appearance and noted at 40 cm from the incisor teeth.  Gastroscopy: Retroflex view of the gastric cardia and fundus was normal. The gastric body, antrum, and pylorus appeared normal except for the presence of a few 3 mm sized benign fundic gland-appearing polyps.  Duodenoscopy: The duodenal bulb and descending duodenum appeared normal. Four biopsies were taken from the second portion of the duodenum and two biopsies were taken from the distal duodenal bulb to look for villous atrophy associated with celiac disease.  Assessment: Normal esophagogastroduodenoscopy. Small bowel biopsies to look for villous atrophy associated with celiac disease pending  Procedure: Surveillance colonoscopy. Anal inspection and digital rectal exam were normal. The Pentax pediatric colonoscope was introduced into the rectum and advanced to the cecum. A normal-appearing appendiceal orifice and ileocecal valve were identified. Colonic preparation for the exam today was good  The patient has universal colonic diverticulosis  Rectum. Normal. Retroflexed view of the distal rectum normal  Sigmoid colon and  descending colon. Normal  Splenic flexure. Normal  Transverse colon. Normal  Hepatic flexure. Normal  Ascending colon. Normal  Cecum and ileocecal valve. Normal  Random colon biopsies. Three biopsies were taken from the right colon and two biopsies were taken from the left colon to look for microscopic colitis  Assessment: Normal surveillance colonoscopy. Universal colonic diverticulosis. Random colon biopsies to look for microscopic colitis pending  Recommendation: Repeat surveillance colonoscopy is not recommended

## 2014-06-26 ENCOUNTER — Encounter (HOSPITAL_COMMUNITY): Payer: Self-pay | Admitting: Gastroenterology

## 2014-07-07 ENCOUNTER — Other Ambulatory Visit: Payer: Self-pay | Admitting: Internal Medicine

## 2014-07-09 NOTE — Telephone Encounter (Signed)
OK X1 

## 2014-08-06 ENCOUNTER — Other Ambulatory Visit: Payer: Self-pay | Admitting: Internal Medicine

## 2014-08-16 ENCOUNTER — Other Ambulatory Visit: Payer: Self-pay

## 2014-08-21 ENCOUNTER — Encounter: Payer: Self-pay | Admitting: Internal Medicine

## 2014-09-02 DIAGNOSIS — H409 Unspecified glaucoma: Secondary | ICD-10-CM | POA: Diagnosis not present

## 2014-09-02 DIAGNOSIS — E538 Deficiency of other specified B group vitamins: Secondary | ICD-10-CM | POA: Diagnosis not present

## 2014-09-02 DIAGNOSIS — Z23 Encounter for immunization: Secondary | ICD-10-CM | POA: Diagnosis not present

## 2014-09-02 DIAGNOSIS — M8589 Other specified disorders of bone density and structure, multiple sites: Secondary | ICD-10-CM | POA: Diagnosis not present

## 2014-09-02 DIAGNOSIS — K900 Celiac disease: Secondary | ICD-10-CM | POA: Diagnosis not present

## 2014-09-02 DIAGNOSIS — I10 Essential (primary) hypertension: Secondary | ICD-10-CM | POA: Diagnosis not present

## 2014-09-02 DIAGNOSIS — H6121 Impacted cerumen, right ear: Secondary | ICD-10-CM | POA: Diagnosis not present

## 2014-09-02 DIAGNOSIS — Z1389 Encounter for screening for other disorder: Secondary | ICD-10-CM | POA: Diagnosis not present

## 2014-09-03 DIAGNOSIS — R8299 Other abnormal findings in urine: Secondary | ICD-10-CM | POA: Diagnosis not present

## 2014-09-03 DIAGNOSIS — M859 Disorder of bone density and structure, unspecified: Secondary | ICD-10-CM | POA: Diagnosis not present

## 2014-09-03 DIAGNOSIS — M8580 Other specified disorders of bone density and structure, unspecified site: Secondary | ICD-10-CM | POA: Diagnosis not present

## 2014-09-03 DIAGNOSIS — E538 Deficiency of other specified B group vitamins: Secondary | ICD-10-CM | POA: Diagnosis not present

## 2014-09-03 DIAGNOSIS — I10 Essential (primary) hypertension: Secondary | ICD-10-CM | POA: Diagnosis not present

## 2014-09-10 DIAGNOSIS — F321 Major depressive disorder, single episode, moderate: Secondary | ICD-10-CM | POA: Diagnosis not present

## 2014-09-19 DIAGNOSIS — H04123 Dry eye syndrome of bilateral lacrimal glands: Secondary | ICD-10-CM | POA: Diagnosis not present

## 2014-09-19 DIAGNOSIS — H4011X2 Primary open-angle glaucoma, moderate stage: Secondary | ICD-10-CM | POA: Diagnosis not present

## 2014-10-03 DIAGNOSIS — R300 Dysuria: Secondary | ICD-10-CM | POA: Diagnosis not present

## 2014-10-03 DIAGNOSIS — Z23 Encounter for immunization: Secondary | ICD-10-CM | POA: Diagnosis not present

## 2014-10-07 DIAGNOSIS — L603 Nail dystrophy: Secondary | ICD-10-CM | POA: Diagnosis not present

## 2014-10-07 DIAGNOSIS — H02729 Madarosis of unspecified eye, unspecified eyelid and periocular area: Secondary | ICD-10-CM | POA: Diagnosis not present

## 2014-10-07 DIAGNOSIS — L680 Hirsutism: Secondary | ICD-10-CM | POA: Diagnosis not present

## 2014-10-07 DIAGNOSIS — L821 Other seborrheic keratosis: Secondary | ICD-10-CM | POA: Diagnosis not present

## 2014-10-07 DIAGNOSIS — L570 Actinic keratosis: Secondary | ICD-10-CM | POA: Diagnosis not present

## 2014-10-07 DIAGNOSIS — D225 Melanocytic nevi of trunk: Secondary | ICD-10-CM | POA: Diagnosis not present

## 2014-11-26 DIAGNOSIS — N200 Calculus of kidney: Secondary | ICD-10-CM | POA: Diagnosis not present

## 2014-11-27 ENCOUNTER — Other Ambulatory Visit: Payer: Self-pay

## 2014-11-27 ENCOUNTER — Other Ambulatory Visit: Payer: Self-pay | Admitting: Internal Medicine

## 2014-11-27 DIAGNOSIS — Z1231 Encounter for screening mammogram for malignant neoplasm of breast: Secondary | ICD-10-CM

## 2014-11-28 NOTE — Telephone Encounter (Signed)
Ok X 3 mos

## 2014-12-05 ENCOUNTER — Ambulatory Visit
Admission: RE | Admit: 2014-12-05 | Discharge: 2014-12-05 | Disposition: A | Discharge status: Home / Self Care | Payer: Medicare Other | Source: Ambulatory Visit

## 2014-12-05 DIAGNOSIS — Z1231 Encounter for screening mammogram for malignant neoplasm of breast: Secondary | ICD-10-CM

## 2015-01-23 DIAGNOSIS — H04123 Dry eye syndrome of bilateral lacrimal glands: Secondary | ICD-10-CM | POA: Diagnosis not present

## 2015-04-22 DIAGNOSIS — M79605 Pain in left leg: Secondary | ICD-10-CM | POA: Diagnosis not present

## 2015-04-22 DIAGNOSIS — M79604 Pain in right leg: Secondary | ICD-10-CM | POA: Diagnosis not present

## 2015-04-29 DIAGNOSIS — M79605 Pain in left leg: Secondary | ICD-10-CM | POA: Diagnosis not present

## 2015-04-29 DIAGNOSIS — M79604 Pain in right leg: Secondary | ICD-10-CM | POA: Diagnosis not present

## 2015-05-26 DIAGNOSIS — H04123 Dry eye syndrome of bilateral lacrimal glands: Secondary | ICD-10-CM | POA: Diagnosis not present

## 2015-05-26 DIAGNOSIS — H35033 Hypertensive retinopathy, bilateral: Secondary | ICD-10-CM | POA: Diagnosis not present

## 2015-05-26 DIAGNOSIS — H4020X2 Unspecified primary angle-closure glaucoma, moderate stage: Secondary | ICD-10-CM | POA: Diagnosis not present

## 2015-05-27 DIAGNOSIS — M79604 Pain in right leg: Secondary | ICD-10-CM | POA: Diagnosis not present

## 2015-05-27 DIAGNOSIS — M79605 Pain in left leg: Secondary | ICD-10-CM | POA: Diagnosis not present

## 2015-08-08 DIAGNOSIS — Z23 Encounter for immunization: Secondary | ICD-10-CM | POA: Diagnosis not present

## 2015-08-25 DIAGNOSIS — M79605 Pain in left leg: Secondary | ICD-10-CM | POA: Diagnosis not present

## 2015-08-25 DIAGNOSIS — M79604 Pain in right leg: Secondary | ICD-10-CM | POA: Diagnosis not present

## 2015-09-16 DIAGNOSIS — N390 Urinary tract infection, site not specified: Secondary | ICD-10-CM | POA: Diagnosis not present

## 2015-09-16 DIAGNOSIS — E538 Deficiency of other specified B group vitamins: Secondary | ICD-10-CM | POA: Diagnosis not present

## 2015-09-16 DIAGNOSIS — R8299 Other abnormal findings in urine: Secondary | ICD-10-CM | POA: Diagnosis not present

## 2015-09-16 DIAGNOSIS — M859 Disorder of bone density and structure, unspecified: Secondary | ICD-10-CM | POA: Diagnosis not present

## 2015-09-16 DIAGNOSIS — I10 Essential (primary) hypertension: Secondary | ICD-10-CM | POA: Diagnosis not present

## 2015-09-23 DIAGNOSIS — Z0000 Encounter for general adult medical examination without abnormal findings: Secondary | ICD-10-CM | POA: Diagnosis not present

## 2015-09-23 DIAGNOSIS — H409 Unspecified glaucoma: Secondary | ICD-10-CM | POA: Diagnosis not present

## 2015-09-23 DIAGNOSIS — Z6824 Body mass index (BMI) 24.0-24.9, adult: Secondary | ICD-10-CM | POA: Diagnosis not present

## 2015-09-23 DIAGNOSIS — R011 Cardiac murmur, unspecified: Secondary | ICD-10-CM | POA: Diagnosis not present

## 2015-09-23 DIAGNOSIS — R300 Dysuria: Secondary | ICD-10-CM | POA: Diagnosis not present

## 2015-09-23 DIAGNOSIS — Z1231 Encounter for screening mammogram for malignant neoplasm of breast: Secondary | ICD-10-CM | POA: Diagnosis not present

## 2015-09-23 DIAGNOSIS — I10 Essential (primary) hypertension: Secondary | ICD-10-CM | POA: Diagnosis not present

## 2015-09-23 DIAGNOSIS — E538 Deficiency of other specified B group vitamins: Secondary | ICD-10-CM | POA: Diagnosis not present

## 2015-09-23 DIAGNOSIS — K900 Celiac disease: Secondary | ICD-10-CM | POA: Diagnosis not present

## 2015-09-23 DIAGNOSIS — M859 Disorder of bone density and structure, unspecified: Secondary | ICD-10-CM | POA: Diagnosis not present

## 2015-09-23 DIAGNOSIS — M419 Scoliosis, unspecified: Secondary | ICD-10-CM | POA: Diagnosis not present

## 2015-09-23 DIAGNOSIS — Z1389 Encounter for screening for other disorder: Secondary | ICD-10-CM | POA: Diagnosis not present

## 2015-09-30 ENCOUNTER — Other Ambulatory Visit: Payer: Self-pay | Admitting: Internal Medicine

## 2015-09-30 DIAGNOSIS — N6310 Unspecified lump in the right breast, unspecified quadrant: Secondary | ICD-10-CM

## 2015-10-03 ENCOUNTER — Ambulatory Visit
Admission: RE | Admit: 2015-10-03 | Discharge: 2015-10-03 | Disposition: A | Discharge status: Home / Self Care | Payer: Medicare Other | Source: Ambulatory Visit | Attending: Internal Medicine | Admitting: Internal Medicine

## 2015-10-03 ENCOUNTER — Ambulatory Visit
Admission: RE | Admit: 2015-10-03 | Discharge: 2015-10-03 | Disposition: A | Discharge status: Home / Self Care | Payer: PRIVATE HEALTH INSURANCE | Source: Ambulatory Visit | Attending: Internal Medicine | Admitting: Internal Medicine

## 2015-10-03 DIAGNOSIS — N6310 Unspecified lump in the right breast, unspecified quadrant: Secondary | ICD-10-CM

## 2015-10-03 DIAGNOSIS — R928 Other abnormal and inconclusive findings on diagnostic imaging of breast: Secondary | ICD-10-CM | POA: Diagnosis not present

## 2015-10-03 DIAGNOSIS — N6489 Other specified disorders of breast: Secondary | ICD-10-CM | POA: Diagnosis not present

## 2015-10-30 DIAGNOSIS — H401132 Primary open-angle glaucoma, bilateral, moderate stage: Secondary | ICD-10-CM | POA: Diagnosis not present

## 2015-10-30 DIAGNOSIS — H04123 Dry eye syndrome of bilateral lacrimal glands: Secondary | ICD-10-CM | POA: Diagnosis not present

## 2015-11-19 ENCOUNTER — Other Ambulatory Visit: Payer: Self-pay

## 2015-11-19 DIAGNOSIS — Z1231 Encounter for screening mammogram for malignant neoplasm of breast: Secondary | ICD-10-CM

## 2015-11-25 DIAGNOSIS — D692 Other nonthrombocytopenic purpura: Secondary | ICD-10-CM | POA: Diagnosis not present

## 2015-11-25 DIAGNOSIS — L821 Other seborrheic keratosis: Secondary | ICD-10-CM | POA: Diagnosis not present

## 2015-11-25 DIAGNOSIS — D225 Melanocytic nevi of trunk: Secondary | ICD-10-CM | POA: Diagnosis not present

## 2015-11-25 DIAGNOSIS — D1801 Hemangioma of skin and subcutaneous tissue: Secondary | ICD-10-CM | POA: Diagnosis not present

## 2015-12-07 ENCOUNTER — Encounter (HOSPITAL_COMMUNITY): Payer: Self-pay | Admitting: *Deleted

## 2015-12-07 ENCOUNTER — Emergency Department (HOSPITAL_COMMUNITY)
Admission: EM | Admit: 2015-12-07 | Discharge: 2015-12-07 | Disposition: A | Discharge status: Home / Self Care | Payer: Medicare Other | Attending: Emergency Medicine | Admitting: Emergency Medicine

## 2015-12-07 ENCOUNTER — Emergency Department (HOSPITAL_COMMUNITY): Payer: Medicare Other

## 2015-12-07 DIAGNOSIS — E039 Hypothyroidism, unspecified: Secondary | ICD-10-CM | POA: Insufficient documentation

## 2015-12-07 DIAGNOSIS — Z87891 Personal history of nicotine dependence: Secondary | ICD-10-CM | POA: Insufficient documentation

## 2015-12-07 DIAGNOSIS — Z8719 Personal history of other diseases of the digestive system: Secondary | ICD-10-CM | POA: Insufficient documentation

## 2015-12-07 DIAGNOSIS — S59902D Unspecified injury of left elbow, subsequent encounter: Secondary | ICD-10-CM | POA: Diagnosis present

## 2015-12-07 DIAGNOSIS — Z862 Personal history of diseases of the blood and blood-forming organs and certain disorders involving the immune mechanism: Secondary | ICD-10-CM | POA: Diagnosis not present

## 2015-12-07 DIAGNOSIS — I10 Essential (primary) hypertension: Secondary | ICD-10-CM | POA: Insufficient documentation

## 2015-12-07 DIAGNOSIS — S51032A Puncture wound without foreign body of left elbow, initial encounter: Secondary | ICD-10-CM | POA: Diagnosis not present

## 2015-12-07 DIAGNOSIS — S51032D Puncture wound without foreign body of left elbow, subsequent encounter: Secondary | ICD-10-CM | POA: Insufficient documentation

## 2015-12-07 DIAGNOSIS — M8580 Other specified disorders of bone density and structure, unspecified site: Secondary | ICD-10-CM | POA: Diagnosis not present

## 2015-12-07 DIAGNOSIS — X58XXXD Exposure to other specified factors, subsequent encounter: Secondary | ICD-10-CM | POA: Insufficient documentation

## 2015-12-07 DIAGNOSIS — Z87442 Personal history of urinary calculi: Secondary | ICD-10-CM | POA: Diagnosis not present

## 2015-12-07 DIAGNOSIS — Z79899 Other long term (current) drug therapy: Secondary | ICD-10-CM | POA: Diagnosis not present

## 2015-12-07 DIAGNOSIS — Z86010 Personal history of colonic polyps: Secondary | ICD-10-CM | POA: Diagnosis not present

## 2015-12-07 DIAGNOSIS — H409 Unspecified glaucoma: Secondary | ICD-10-CM | POA: Insufficient documentation

## 2015-12-07 DIAGNOSIS — R011 Cardiac murmur, unspecified: Secondary | ICD-10-CM | POA: Insufficient documentation

## 2015-12-07 DIAGNOSIS — F329 Major depressive disorder, single episode, unspecified: Secondary | ICD-10-CM | POA: Insufficient documentation

## 2015-12-07 DIAGNOSIS — T148XXA Other injury of unspecified body region, initial encounter: Secondary | ICD-10-CM

## 2015-12-07 DIAGNOSIS — S59902A Unspecified injury of left elbow, initial encounter: Secondary | ICD-10-CM | POA: Diagnosis not present

## 2015-12-07 MED ORDER — CLINDAMYCIN HCL 150 MG PO CAPS
150.0000 mg | ORAL_CAPSULE | Freq: Once | ORAL | Status: AC
  Administered 2015-12-07: 150 mg via ORAL
  Filled 2015-12-07: qty 1

## 2015-12-07 MED ORDER — CLINDAMYCIN HCL 150 MG PO CAPS: 150.0000 mg | ORAL_CAPSULE | Freq: Four times a day (QID) | ORAL | Status: DC

## 2015-12-07 NOTE — Discharge Instructions (Signed)
Soak the left elbow in warm water 3 times a day for 20 minutes.  Clean the wound of the left arm well with soap and water once or twice each day.  Keep a light dressing on the elbow wound, after soaking and cleaning.  Start the antibiotic prescription, tomorrow. It was sent electronically, to your pharmacy.

## 2015-12-07 NOTE — ED Notes (Signed)
Pt reports she cut her left elbow on something on Monday morning. Since then, has had yellowish-white drainage from the area. Pt denies fevers.

## 2015-12-07 NOTE — ED Provider Notes (Signed)
CSN: [id number]     Arrival date & time 12/07/15  1830 History   First MD Initiated Contact with Patient 12/07/15 2119     Chief Complaint  Patient presents with  . Elbow Pain     (Consider location/radiation/quality/duration/timing/severity/associated sxs/prior Treatment) HPI   Krista Carlson is a 82 y.o. female here for evaluation of left elbow wound, one week ago. She has persistent mild drainage from the wound. She denies fever, chills, nausea, vomiting, weakness or dizziness. A friend of hers was a nurse saw it tonight, and was worried that she should have urgent evaluation and possibly a wound culture done. No prior similar problem. She is taking her usual medications. There are no other known modifying factors.   Past Medical History  Diagnosis Date  . Osteopenia   . Aortic heart murmur   . Diverticulitis of colon   . Glaucoma   . History of colonic polyps     3 mm, Dr Wynetta Emery  . Hypothyroidism   . Spinal stenosis   . Hypertension   . Heart murmur   . Anemia   . Glaucoma 2012  . Arthritis   . Celiac disease     Dr Wynetta Emery  . Depression   . Kidney stone     stone in left kidney now   Past Surgical History  Procedure Laterality Date  . Cataract extraction Bilateral yrs ago  . Hemorrhoid surgery    . Polypectomy  2010    due 2015, 3 mm  . Knee arthroscopy  2007    Left  . Nose surgery  2007    Fractured Nose Reset  . Inguinal hernia repair  11/16/2011    Procedure: HERNIA REPAIR INGUINAL ADULT;  Surgeon: Edward Jolly, MD;  Location: [facility];  Service: General;  Laterality: Left;  with mesh  . Colon biopsy  1998    Celiac Sprue; Dr Wynetta Emery  . Colonoscopy  2010    Tics& polyp  . Tonsillectomy and adenoidectomy  1941  . Breast cyst aspiration Left 1983  . Hernia repair  1943    Right Side  . Tubal ligation  1966  . Appendectomy  1966    @ tubal  . Orif broken ankle Left 2013  . Esophagogastroduodenoscopy (egd) with propofol N/A 06/25/2014    Procedure:  ESOPHAGOGASTRODUODENOSCOPY (EGD) WITH PROPOFOL;  Surgeon: Garlan Fair, MD;  Location: [facility];  Service: Endoscopy;  Laterality: N/A;  . Colonoscopy with propofol N/A 06/25/2014    Procedure: COLONOSCOPY WITH PROPOFOL;  Surgeon: Garlan Fair, MD;  Location: [facility];  Service: Endoscopy;  Laterality: N/A;   Family History  Problem Relation Age of Onset  . Depression Mother     manic  . Heart attack Mother 41  . COPD Mother     heavy smoker  . Coronary artery disease Maternal Uncle     X 6  . Cancer Maternal Uncle     intestinal  . Diabetes Maternal Uncle     X2  . Breast cancer Paternal Aunt   . Depression      strong maternal hx with mania  . Diverticulitis      M uncle & M aunt  . Colon cancer      M aunt & M uncle  . Diabetes Maternal Aunt   . Stroke Neg Hx   . Asthma Neg Hx    Social History  Substance Use Topics  . Smoking status: Former Smoker -- 0.25 packs/day for  25 years    Types: Cigarettes    Quit date: 11/01/1984  . Smokeless tobacco: Never Used  . Alcohol Use: 0.0 oz/week     Comment: whiskey once month   OB History    No data available     Review of Systems  All other systems reviewed and are negative.     Allergies  Nickel  Home Medications   Prior to Admission medications   Medication Sig Start Date End Date Taking? Authorizing Provider  amLODipine (NORVASC) 5 MG tablet Take 5 mg by mouth every morning.   Yes Historical Provider, MD  buPROPion (WELLBUTRIN XL) 150 MG 24 hr tablet TAKE 1 TABLET EACH DAY. Patient taking differently: TAKE 150 MG BY MOUTH DAILY 11/28/14  Yes Hendricks Limes, MD  Calcium Carbonate-Vitamin D (CALCIUM + D PO) Take 600 mg by mouth 2 (two) times daily.    Yes Historical Provider, MD  cycloSPORINE (RESTASIS) 0.05 % ophthalmic emulsion Place 1 drop into both eyes 2 (two) times daily.    Yes Historical Provider, MD  Eflornithine HCl (VANIQA) 13.9 % cream Apply 1 application topically 2 (two) times daily.     Yes Historical Provider, MD  Glucosamine-Chondroitin (COSAMIN DS PO) Take 1 tablet by mouth 2 (two) times daily.     Yes Historical Provider, MD  latanoprost (XALATAN) 0.005 % ophthalmic solution Place 1 drop into both eyes at bedtime.   Yes Historical Provider, MD  levothyroxine (SYNTHROID, LEVOTHROID) 112 MCG tablet Take 56-112 mcg by mouth as directed.   Yes Historical Provider, MD  multivitamin [facility]) per tablet Take 1 tablet by mouth daily.     Yes Historical Provider, MD  rosuvastatin (CRESTOR) 20 MG tablet Take 5 mg by mouth every Monday, Wednesday, and Friday.  04/23/13  Yes Hendricks Limes, MD  traZODone (DESYREL) 100 MG tablet TAKE ONE TABLET AT BEDTIME. Patient taking differently: TAKE 150 MG BY MOUTH  AT BEDTIME. 08/07/14  Yes Hendricks Limes, MD  clindamycin (CLEOCIN) 150 MG capsule Take 1 capsule (150 mg total) by mouth every 6 (six) hours. 12/07/15   Daleen Bo, MD   BP 153/94 mmHg  Pulse 73  Temp(Src) 98.9 F (37.2 C) (Oral)  Resp 16  Ht 5' (1.524 m)  Wt 124 lb (56.246 kg)  BMI 24.22 kg/m2  SpO2 98% Physical Exam  Constitutional: She is oriented to person, place, and time. She appears well-developed and well-nourished. No distress.  HENT:  Head: Normocephalic and atraumatic.  Right Ear: External ear normal.  Left Ear: External ear normal.  Eyes: Conjunctivae and EOM are normal. Pupils are equal, round, and reactive to light.  Neck: Normal range of motion and phonation normal. Neck supple.  Cardiovascular: Normal rate.   Pulmonary/Chest: Effort normal. She exhibits no bony tenderness.  Musculoskeletal: Normal range of motion.  Left elbow has 100% normal range of motion both flexion, extension, and pronation/supination. There is no pain with any of these maneuvers. Posterior elbow is not swollen. There is a open wound approximately 1/2 cm, of the posterior elbow, at the extension crease. Is a small amount of white material within this wound, that is adherent. It is  unclear if this represents material such as bursa, or adipose. There is a very minimal thin clear discharge, coming from the wound. There is not enough for wound discharge, for culture.  Neurological: She is alert and oriented to person, place, and time. No cranial nerve deficit or sensory deficit. She exhibits normal muscle tone. Coordination  normal.  Skin: Skin is warm, dry and intact.  Psychiatric: She has a normal mood and affect. Her behavior is normal. Judgment and thought content normal.  Nursing note and vitals reviewed.   ED Course  Procedures (including critical care time)  Medications  clindamycin (CLEOCIN) capsule 150 mg (not administered)    Patient Vitals for the past 24 hrs:  BP Temp Temp src Pulse Resp SpO2 Height Weight  12/07/15 1951 153/94 mmHg 98.9 F (37.2 C) Oral 73 16 98 % 5' (1.524 m) 124 lb (56.246 kg)    9:37 PM Reevaluation with update and discussion. After initial assessment and treatment, an updated evaluation reveals she remains comfortable. Findings discussed with the patient and her family member, all questions answered. [place] Review Labs Reviewed - No data to display  Imaging Review Dg Elbow Complete Left  12/07/2015  CLINICAL DATA:  Pt reports she cut her left elbow on something on Monday morning. Since then, has had yellowish-white drainage from the area. Pt denies fevers. EXAM: LEFT ELBOW - COMPLETE 3+ VIEW COMPARISON:  None. FINDINGS: Soft tissue gas identified within the posterior aspect of the elbow. There is no acute fracture or subluxation. Degenerative changes are seen at the elbow. No joint effusion identified of the lateral view. IMPRESSION: 1. Significant soft tissue gas along the posterior aspect of the elbow. 2. Findings consistent with soft tissue abscess or early gangrene. Electronically Signed   By: Nolon Nations M.D.   On: 12/07/2015 20:22   I have personally reviewed and evaluated these images and lab results as  part of my medical decision-making.   EKG Interpretation None      MDM   Final diagnoses:  Puncture wound    Puncture wound, without frank infection, healing slowly. X-ray imaging reviewed, the area of concern for abscess appears to be air, and not purulence. The wound is draining a very small amount of thin clear fluid. I doubt septic joint, significant cellulitis, abscess, or ascending infection.  Nursing Notes Reviewed/ Care Coordinated, and agree without changes. Applicable Imaging Reviewed.  Interpretation of Laboratory Data incorporated into ED treatment  Nursing Notes Reviewed/ Care Coordinated Applicable Imaging Reviewed Interpretation of Laboratory Data incorporated into ED treatment  The patient appears reasonably screened and/or stabilized for discharge and I doubt any other medical condition or other [facility] requiring further screening, evaluation, or treatment in the ED at this time prior to discharge.  Plan: Home Medications- clindamycin; Home Treatments- wound care with soaking and bandage.; return here if the recommended treatment, does not improve the symptoms; Recommended follow up- PCP follow-up 3 days. I would recommend culture result that time, if the drainage increases or the wound is not improved. The wound would likely need to be opened for adequate culture.     Daleen Bo, MD 12/07/15 2140

## 2015-12-10 DIAGNOSIS — Z6825 Body mass index (BMI) 25.0-25.9, adult: Secondary | ICD-10-CM | POA: Diagnosis not present

## 2015-12-10 DIAGNOSIS — S51012A Laceration without foreign body of left elbow, initial encounter: Secondary | ICD-10-CM | POA: Diagnosis not present

## 2015-12-10 DIAGNOSIS — M7022 Olecranon bursitis, left elbow: Secondary | ICD-10-CM | POA: Diagnosis not present

## 2015-12-17 DIAGNOSIS — Z6825 Body mass index (BMI) 25.0-25.9, adult: Secondary | ICD-10-CM | POA: Diagnosis not present

## 2015-12-17 DIAGNOSIS — S51019S Laceration without foreign body of unspecified elbow, sequela: Secondary | ICD-10-CM | POA: Diagnosis not present

## 2015-12-17 DIAGNOSIS — M7022 Olecranon bursitis, left elbow: Secondary | ICD-10-CM | POA: Diagnosis not present

## 2015-12-22 DIAGNOSIS — M7022 Olecranon bursitis, left elbow: Secondary | ICD-10-CM | POA: Diagnosis not present

## 2015-12-22 DIAGNOSIS — Z6825 Body mass index (BMI) 25.0-25.9, adult: Secondary | ICD-10-CM | POA: Diagnosis not present

## 2015-12-22 DIAGNOSIS — S51019S Laceration without foreign body of unspecified elbow, sequela: Secondary | ICD-10-CM | POA: Diagnosis not present

## 2015-12-29 DIAGNOSIS — M7020 Olecranon bursitis, unspecified elbow: Secondary | ICD-10-CM | POA: Diagnosis not present

## 2015-12-29 DIAGNOSIS — Z6825 Body mass index (BMI) 25.0-25.9, adult: Secondary | ICD-10-CM | POA: Diagnosis not present

## 2015-12-30 ENCOUNTER — Ambulatory Visit
Admission: RE | Admit: 2015-12-30 | Discharge: 2015-12-30 | Disposition: A | Discharge status: Home / Self Care | Payer: Medicare Other | Source: Ambulatory Visit

## 2015-12-30 DIAGNOSIS — L03114 Cellulitis of left upper limb: Secondary | ICD-10-CM | POA: Diagnosis not present

## 2015-12-30 DIAGNOSIS — Z1231 Encounter for screening mammogram for malignant neoplasm of breast: Secondary | ICD-10-CM

## 2016-01-02 DIAGNOSIS — Z0000 Encounter for general adult medical examination without abnormal findings: Secondary | ICD-10-CM | POA: Diagnosis not present

## 2016-01-02 DIAGNOSIS — L03114 Cellulitis of left upper limb: Secondary | ICD-10-CM | POA: Diagnosis not present

## 2016-01-02 DIAGNOSIS — N200 Calculus of kidney: Secondary | ICD-10-CM | POA: Diagnosis not present

## 2016-01-02 DIAGNOSIS — N3281 Overactive bladder: Secondary | ICD-10-CM | POA: Diagnosis not present

## 2016-03-30 DIAGNOSIS — Z0000 Encounter for general adult medical examination without abnormal findings: Secondary | ICD-10-CM | POA: Diagnosis not present

## 2016-03-30 DIAGNOSIS — M545 Low back pain: Secondary | ICD-10-CM | POA: Diagnosis not present

## 2016-03-30 DIAGNOSIS — N200 Calculus of kidney: Secondary | ICD-10-CM | POA: Diagnosis not present

## 2016-05-19 DIAGNOSIS — H35033 Hypertensive retinopathy, bilateral: Secondary | ICD-10-CM | POA: Diagnosis not present

## 2016-05-19 DIAGNOSIS — H401132 Primary open-angle glaucoma, bilateral, moderate stage: Secondary | ICD-10-CM | POA: Diagnosis not present

## 2016-05-19 DIAGNOSIS — H04123 Dry eye syndrome of bilateral lacrimal glands: Secondary | ICD-10-CM | POA: Diagnosis not present

## 2016-05-19 DIAGNOSIS — H524 Presbyopia: Secondary | ICD-10-CM | POA: Diagnosis not present

## 2016-07-29 DIAGNOSIS — S0502XA Injury of conjunctiva and corneal abrasion without foreign body, left eye, initial encounter: Secondary | ICD-10-CM | POA: Diagnosis not present

## 2016-08-03 DIAGNOSIS — H04123 Dry eye syndrome of bilateral lacrimal glands: Secondary | ICD-10-CM | POA: Diagnosis not present

## 2016-08-03 DIAGNOSIS — H401132 Primary open-angle glaucoma, bilateral, moderate stage: Secondary | ICD-10-CM | POA: Diagnosis not present

## 2016-08-03 DIAGNOSIS — H524 Presbyopia: Secondary | ICD-10-CM | POA: Diagnosis not present

## 2016-08-03 DIAGNOSIS — H35033 Hypertensive retinopathy, bilateral: Secondary | ICD-10-CM | POA: Diagnosis not present

## 2016-08-03 DIAGNOSIS — H11821 Conjunctivochalasis, right eye: Secondary | ICD-10-CM | POA: Diagnosis not present

## 2016-08-05 DIAGNOSIS — H04123 Dry eye syndrome of bilateral lacrimal glands: Secondary | ICD-10-CM | POA: Diagnosis not present

## 2016-08-05 DIAGNOSIS — H401132 Primary open-angle glaucoma, bilateral, moderate stage: Secondary | ICD-10-CM | POA: Diagnosis not present

## 2016-08-05 DIAGNOSIS — H11821 Conjunctivochalasis, right eye: Secondary | ICD-10-CM | POA: Diagnosis not present

## 2016-08-05 DIAGNOSIS — H35033 Hypertensive retinopathy, bilateral: Secondary | ICD-10-CM | POA: Diagnosis not present

## 2016-08-05 DIAGNOSIS — H524 Presbyopia: Secondary | ICD-10-CM | POA: Diagnosis not present

## 2016-08-11 DIAGNOSIS — H401132 Primary open-angle glaucoma, bilateral, moderate stage: Secondary | ICD-10-CM | POA: Diagnosis not present

## 2016-08-11 DIAGNOSIS — H11821 Conjunctivochalasis, right eye: Secondary | ICD-10-CM | POA: Diagnosis not present

## 2016-08-11 DIAGNOSIS — H04123 Dry eye syndrome of bilateral lacrimal glands: Secondary | ICD-10-CM | POA: Diagnosis not present

## 2016-08-11 DIAGNOSIS — H35033 Hypertensive retinopathy, bilateral: Secondary | ICD-10-CM | POA: Diagnosis not present

## 2016-08-19 DIAGNOSIS — Z23 Encounter for immunization: Secondary | ICD-10-CM | POA: Diagnosis not present

## 2016-10-04 DIAGNOSIS — R8299 Other abnormal findings in urine: Secondary | ICD-10-CM | POA: Diagnosis not present

## 2016-10-04 DIAGNOSIS — E038 Other specified hypothyroidism: Secondary | ICD-10-CM | POA: Diagnosis not present

## 2016-10-04 DIAGNOSIS — N390 Urinary tract infection, site not specified: Secondary | ICD-10-CM | POA: Diagnosis not present

## 2016-10-04 DIAGNOSIS — E538 Deficiency of other specified B group vitamins: Secondary | ICD-10-CM | POA: Diagnosis not present

## 2016-10-04 DIAGNOSIS — I10 Essential (primary) hypertension: Secondary | ICD-10-CM | POA: Diagnosis not present

## 2016-10-05 DIAGNOSIS — M810 Age-related osteoporosis without current pathological fracture: Secondary | ICD-10-CM | POA: Diagnosis not present

## 2016-10-06 DIAGNOSIS — F329 Major depressive disorder, single episode, unspecified: Secondary | ICD-10-CM | POA: Diagnosis not present

## 2016-10-06 DIAGNOSIS — I10 Essential (primary) hypertension: Secondary | ICD-10-CM | POA: Diagnosis not present

## 2016-10-06 DIAGNOSIS — Z0000 Encounter for general adult medical examination without abnormal findings: Secondary | ICD-10-CM | POA: Diagnosis not present

## 2016-10-06 DIAGNOSIS — R011 Cardiac murmur, unspecified: Secondary | ICD-10-CM | POA: Diagnosis not present

## 2016-10-06 DIAGNOSIS — Z1231 Encounter for screening mammogram for malignant neoplasm of breast: Secondary | ICD-10-CM | POA: Diagnosis not present

## 2016-10-06 DIAGNOSIS — M419 Scoliosis, unspecified: Secondary | ICD-10-CM | POA: Diagnosis not present

## 2016-10-06 DIAGNOSIS — E538 Deficiency of other specified B group vitamins: Secondary | ICD-10-CM | POA: Diagnosis not present

## 2016-10-06 DIAGNOSIS — G608 Other hereditary and idiopathic neuropathies: Secondary | ICD-10-CM | POA: Diagnosis not present

## 2016-10-06 DIAGNOSIS — E038 Other specified hypothyroidism: Secondary | ICD-10-CM | POA: Diagnosis not present

## 2016-10-06 DIAGNOSIS — R8299 Other abnormal findings in urine: Secondary | ICD-10-CM | POA: Diagnosis not present

## 2016-10-06 DIAGNOSIS — M859 Disorder of bone density and structure, unspecified: Secondary | ICD-10-CM | POA: Diagnosis not present

## 2016-10-06 DIAGNOSIS — Z1389 Encounter for screening for other disorder: Secondary | ICD-10-CM | POA: Diagnosis not present

## 2016-10-06 DIAGNOSIS — Z6824 Body mass index (BMI) 24.0-24.9, adult: Secondary | ICD-10-CM | POA: Diagnosis not present

## 2016-10-13 DIAGNOSIS — L821 Other seborrheic keratosis: Secondary | ICD-10-CM | POA: Diagnosis not present

## 2016-10-13 DIAGNOSIS — D225 Melanocytic nevi of trunk: Secondary | ICD-10-CM | POA: Diagnosis not present

## 2016-11-15 ENCOUNTER — Ambulatory Visit (INDEPENDENT_AMBULATORY_CARE_PROVIDER_SITE_OTHER): Payer: Medicare Other | Admitting: Neurology

## 2016-11-15 ENCOUNTER — Encounter: Payer: Self-pay | Admitting: Neurology

## 2016-11-15 DIAGNOSIS — M542 Cervicalgia: Secondary | ICD-10-CM | POA: Insufficient documentation

## 2016-11-15 DIAGNOSIS — R202 Paresthesia of skin: Secondary | ICD-10-CM | POA: Insufficient documentation

## 2016-11-15 NOTE — Progress Notes (Signed)
PATIENT: Krista Carlson DOB: [birth year]  Chief Complaint  Patient presents with  . Peripheral Neuropathy    Reports having numbness, tingling and burning pain in her bilateral lower extremities.  Symptoms present for more than ten years but have become worse.  Marland Kitchen PCP    Crist Infante, MD     HISTORICAL  Krista Carlson is a 83 years old right-handed female, seen in refer by her primary care doctor Crist Infante for evaluation of peripheral neuropathy, initial evaluation was on November 15 2016.  She had past medical history of hypertension, hypothyroidism, on supplement, 1998 biopsy proven celiac disease, on gluten-free diet, she presented with uncontrolled diarrhea for 6 months, had a history of vitamin B12 deficiency, she is still receiving vitamin B-12 intramuscular injection once a month, hyperlipidemia, kidney stone, left ankle fracture in 2013, status post surgical fixation,   She noticed gradual onset bilateral feet paresthesia around 2005, slowly progressive over the years, now ascending to below bilateral knee level, she described constant numb tingling occasionally achy sensation, especially at nighttime, she could not tell if her heating pad is on and off because of constant burning sensation at her lower extremity. She denies weakness, she denies finger paresthesia, she denies significant low back pain, does has left-sided neck strain,  She also has chronic insomnia, is taking trazodone 150 mg at night p.m., go to sleep around 10 PM, she has never tried neuropathic pain medications, was given a prescription of Lyrica 50 mg twice a day just recently.  REVIEW OF SYSTEMS: Full 14 system review of systems performed and notable only for easy bruising, restless leg, murmur  ALLERGIES: Allergies  Allergen Reactions  . Nickel     Redness and discomfort ; contact dermatitis skin turns red    HOME MEDICATIONS: Current Outpatient Prescriptions  Medication Sig Dispense Refill  . amLODipine  (NORVASC) 5 MG tablet Take 5 mg by mouth every morning.    . Calcium Carbonate-Vitamin D (CALCIUM + D PO) Take 600 mg by mouth 2 (two) times daily.     . cycloSPORINE (RESTASIS) 0.05 % ophthalmic emulsion Place 1 drop into both eyes 2 (two) times daily.     . Eflornithine HCl (VANIQA) 13.9 % cream Apply 1 application topically 2 (two) times daily.     . Glucosamine-Chondroitin (COSAMIN DS PO) Take 1 tablet by mouth 2 (two) times daily.      Marland Kitchen latanoprost (XALATAN) 0.005 % ophthalmic solution Place 1 drop into both eyes at bedtime.    Marland Kitchen levothyroxine (SYNTHROID, LEVOTHROID) 112 MCG tablet Take 56-112 mcg by mouth as directed.    . multivitamin (THERAGRAN) per tablet Take 1 tablet by mouth daily.      . pregabalin (LYRICA) 50 MG capsule Take 50 mg by mouth 2 (two) times daily.    . rosuvastatin (CRESTOR) 20 MG tablet Take 5 mg by mouth every Monday, Wednesday, and Friday.     . traZODone (DESYREL) 100 MG tablet TAKE ONE TABLET AT BEDTIME. (Patient taking differently: TAKE 150 MG BY MOUTH  AT BEDTIME.) 30 tablet 0   No current facility-administered medications for this visit.     PAST MEDICAL HISTORY: Past Medical History:  Diagnosis Date  . Anemia   . Aortic heart murmur   . Arthritis   . Celiac disease    Dr Wynetta Emery  . Depression   . Diverticulitis of colon   . Glaucoma   . Glaucoma 2012  . Heart murmur   .  History of colonic polyps    3 mm, Dr Wynetta Emery  . Hypertension   . Hypothyroidism   . Kidney stone    stone in left kidney now  . Osteopenia   . Peripheral neuropathy ([place])   . Spinal stenosis     PAST SURGICAL HISTORY: Past Surgical History:  Procedure Laterality Date  . APPENDECTOMY  1966   @ tubal  . BREAST CYST ASPIRATION Left 1983  . CATARACT EXTRACTION Bilateral yrs ago  . COLON BIOPSY  1998   Celiac Sprue; Dr Wynetta Emery  . COLONOSCOPY  2010   Tics& polyp  . COLONOSCOPY WITH PROPOFOL N/A 06/25/2014   Procedure: COLONOSCOPY WITH PROPOFOL;  Surgeon: Garlan Fair, MD;  Location: [facility];  Service: Endoscopy;  Laterality: N/A;  . ESOPHAGOGASTRODUODENOSCOPY (EGD) WITH PROPOFOL N/A 06/25/2014   Procedure: ESOPHAGOGASTRODUODENOSCOPY (EGD) WITH PROPOFOL;  Surgeon: Garlan Fair, MD;  Location: [facility];  Service: Endoscopy;  Laterality: N/A;  . HEMORRHOID SURGERY    . HERNIA REPAIR  1943   Right Side  . INGUINAL HERNIA REPAIR  11/16/2011   Procedure: HERNIA REPAIR INGUINAL ADULT;  Surgeon: Edward Jolly, MD;  Location: [facility];  Service: General;  Laterality: Left;  with mesh  . KNEE ARTHROSCOPY  2007   Left  . NOSE SURGERY  2007   Fractured Nose Reset  . orif broken ankle Left 2013  . POLYPECTOMY  2010   due 2015, 3 mm  . TONSILLECTOMY AND ADENOIDECTOMY  1941  . TUBAL LIGATION  1966    FAMILY HISTORY: Family History  Problem Relation Age of Onset  . Depression Mother     manic  . Heart attack Mother 70  . COPD Mother     heavy smoker  . Coronary artery disease Maternal Uncle     X 6  . Cancer Maternal Uncle     intestinal  . Diabetes Maternal Uncle     X2  . Breast cancer Paternal Aunt   . Depression      strong maternal hx with mania  . Diverticulitis      M uncle & M aunt  . Colon cancer      M aunt & M uncle  . Diabetes Maternal Aunt   . Stroke Neg Hx   . Asthma Neg Hx     SOCIAL HISTORY:  Social History   Social History  . Marital status: Widowed    Spouse name: N/A  . Number of children: 4  . Years of education: Bachelors +   Occupational History  . Retired    Social History Main Topics  . Smoking status: Former Smoker    Packs/day: 0.25    Years: 25.00    Types: Cigarettes    Quit date: 11/01/1984  . Smokeless tobacco: Never Used  . Alcohol use 0.0 oz/week     Comment: whiskey once month  . Drug use: No  . Sexual activity: No   Other Topics Concern  . Not on file   Social History Narrative   Lives at home alone.   Right-handed.   1 coke per day.        PHYSICAL EXAM     Vitals:   11/15/16 1128  BP: 131/73  Pulse: 60  Weight: 130 lb 4 oz (59.1 kg)  Height: 5' (1.524 m)    Not recorded      Body mass index is 25.44 kg/m.  PHYSICAL EXAMNIATION:  Gen: NAD, conversant, well nourised, obese,  well groomed                     Cardiovascular: Regular rate rhythm, no peripheral edema, warm, nontender. Eyes: Conjunctivae clear without exudates or hemorrhage Neck: Supple, no carotid bruits. Pulmonary: Clear to auscultation bilaterally   NEUROLOGICAL EXAM:  MENTAL STATUS: Speech:    Speech is normal; fluent and spontaneous with normal comprehension.  Cognition:     Orientation to time, place and person     Normal recent and remote memory     Normal Attention span and concentration     Normal Language, naming, repeating,spontaneous speech     Fund of knowledge   CRANIAL NERVES: CN II: Visual fields are full to confrontation. Fundoscopic exam is normal with sharp discs and no vascular changes. Pupils are round equal and briskly reactive to light. CN III, IV, VI: extraocular movement are normal. No ptosis. CN V: Facial sensation is intact to pinprick in all 3 divisions bilaterally. Corneal responses are intact.  CN VII: Face is symmetric with normal eye closure and smile. CN VIII: Hearing is normal to rubbing fingers CN IX, X: Palate elevates symmetrically. Phonation is normal. CN XI: Head turning and shoulder shrug are intact CN XII: Tongue is midline with normal movements and no atrophy.  MOTOR: There is no pronator drift of out-stretched arms. Muscle bulk and tone are normal. Muscle strength is normal.  REFLEXES: Reflexes are 3 and symmetric at the biceps, triceps, knees, and ankles. Plantar responses are extensor bilaterally  SENSORY: Mildly length dependent decreased to light touch, pinprick in feet.preserved to vibratory sensation.  COORDINATION: Rapid alternating movements and fine finger movements are intact. There is no dysmetria on  finger-to-nose and heel-knee-shin.    GAIT/STANCE: Posture is normal. Gait is steady with normal steps, base, arm swing, and turning.   Romberg is absent.   DIAGNOSTIC DATA (LABS, IMAGING, TESTING) - I reviewed patient records, labs, notes, testing and imaging myself where available.   ASSESSMENT AND PLAN  WYNNIE SCARFONE is a 83 y.o. female   Progressive ascending paresthesia  DDX include peripheral neuropathy, also include cervical myelopathy.  MRI cervical  EMG/NCS  Bring lab result from her primary care physician History of celiac disease, vitamin B12 deficiency, on gluten-free diet, vitamin B12 IM supplement.     Marcial Pacas, M.D. Ph.D.  [facility] [address] [place], [place] [address] Ph: [phone] Fax: [phone]  CC: Crist Infante, MD

## 2016-11-24 DIAGNOSIS — R3121 Asymptomatic microscopic hematuria: Secondary | ICD-10-CM | POA: Diagnosis not present

## 2016-11-24 DIAGNOSIS — M859 Disorder of bone density and structure, unspecified: Secondary | ICD-10-CM | POA: Diagnosis not present

## 2016-12-01 DIAGNOSIS — H01004 Unspecified blepharitis left upper eyelid: Secondary | ICD-10-CM | POA: Diagnosis not present

## 2016-12-01 DIAGNOSIS — H401132 Primary open-angle glaucoma, bilateral, moderate stage: Secondary | ICD-10-CM | POA: Diagnosis not present

## 2016-12-01 DIAGNOSIS — H01005 Unspecified blepharitis left lower eyelid: Secondary | ICD-10-CM | POA: Diagnosis not present

## 2016-12-01 DIAGNOSIS — H01002 Unspecified blepharitis right lower eyelid: Secondary | ICD-10-CM | POA: Diagnosis not present

## 2016-12-01 DIAGNOSIS — H01001 Unspecified blepharitis right upper eyelid: Secondary | ICD-10-CM | POA: Diagnosis not present

## 2016-12-02 ENCOUNTER — Ambulatory Visit
Admission: RE | Admit: 2016-12-02 | Discharge: 2016-12-02 | Disposition: A | Discharge status: Home / Self Care | Payer: Medicare Other | Source: Ambulatory Visit | Attending: Neurology | Admitting: Neurology

## 2016-12-02 DIAGNOSIS — R202 Paresthesia of skin: Secondary | ICD-10-CM

## 2016-12-02 DIAGNOSIS — M542 Cervicalgia: Secondary | ICD-10-CM

## 2016-12-16 ENCOUNTER — Other Ambulatory Visit: Payer: Self-pay | Admitting: Internal Medicine

## 2016-12-20 ENCOUNTER — Other Ambulatory Visit: Payer: Self-pay | Admitting: Internal Medicine

## 2016-12-20 DIAGNOSIS — N63 Unspecified lump in unspecified breast: Secondary | ICD-10-CM

## 2016-12-21 DIAGNOSIS — G609 Hereditary and idiopathic neuropathy, unspecified: Secondary | ICD-10-CM | POA: Diagnosis not present

## 2016-12-21 DIAGNOSIS — M859 Disorder of bone density and structure, unspecified: Secondary | ICD-10-CM | POA: Diagnosis not present

## 2016-12-21 DIAGNOSIS — Z6825 Body mass index (BMI) 25.0-25.9, adult: Secondary | ICD-10-CM | POA: Diagnosis not present

## 2016-12-31 ENCOUNTER — Ambulatory Visit (INDEPENDENT_AMBULATORY_CARE_PROVIDER_SITE_OTHER): Payer: Self-pay | Admitting: Neurology

## 2016-12-31 ENCOUNTER — Ambulatory Visit (INDEPENDENT_AMBULATORY_CARE_PROVIDER_SITE_OTHER): Payer: Medicare Other | Admitting: Neurology

## 2016-12-31 ENCOUNTER — Telehealth: Payer: Self-pay | Admitting: Neurology

## 2016-12-31 DIAGNOSIS — R202 Paresthesia of skin: Secondary | ICD-10-CM | POA: Diagnosis not present

## 2016-12-31 DIAGNOSIS — Z0289 Encounter for other administrative examinations: Secondary | ICD-10-CM

## 2016-12-31 DIAGNOSIS — G6289 Other specified polyneuropathies: Secondary | ICD-10-CM

## 2016-12-31 DIAGNOSIS — E039 Hypothyroidism, unspecified: Secondary | ICD-10-CM

## 2016-12-31 DIAGNOSIS — M542 Cervicalgia: Secondary | ICD-10-CM | POA: Diagnosis not present

## 2016-12-31 NOTE — Patient Instructions (Signed)
Bring laboratory evaluation at next office visit  You may take Lyrica 50 mg 2, even 3 tablets every night before you go to sleep, skip morning dose.

## 2016-12-31 NOTE — Telephone Encounter (Signed)
Spoke to patient - she has been scheduled 01/18/17 for her skin biopsy.

## 2016-12-31 NOTE — Telephone Encounter (Signed)
Pt needs skin bx scheduled. Pt would like [place] to call her on Tue 01/04/17 to schedule appt with her on the phone.

## 2016-12-31 NOTE — Progress Notes (Signed)
PATIENT: Krista Carlson DOB: [birth year]  No chief complaint on file.    HISTORICAL  Krista Carlson is a 83 years old right-handed female, seen in refer by her primary care doctor Crist Infante for evaluation of peripheral neuropathy, initial evaluation was on November 15 2016.  She had past medical history of hypertension, hypothyroidism, on supplement, 1998 biopsy proven celiac disease, on gluten-free diet, she presented with uncontrolled diarrhea for 6 months, had a history of vitamin B12 deficiency, she is still receiving vitamin B-12 intramuscular injection once a month, hyperlipidemia, kidney stone, left ankle fracture in 2013, status post surgical fixation,   She noticed gradual onset bilateral feet paresthesia around 2005, slowly progressive over the years, now ascending to below bilateral knee level, she described constant numb tingling occasionally achy sensation, especially at nighttime, she could not tell if her heating pad is on and off because of constant burning sensation at her lower extremity. She denies weakness, she denies finger paresthesia, she denies significant low back pain, does has left-sided neck strain,  She also has chronic insomnia, is taking trazodone 150 mg at night p.m., go to sleep around 10 PM, she has never tried neuropathic pain medications, was given a prescription of Lyrica 50 mg twice a day just recently.  Update December 31 2016: Patient returned for electrodiagnostic study today, there was no evidence of large fiber peripheral neuropathy, no evidence of lumbar sacral radiculopathy  We have personally reviewed MRI cervical spine in 2018, multilevel degenerative changes, no evidence of significant canal stenosis.  She was taking Lyrica 50 mg twice a day, no significant benefit, chronic insomnia, taking trazodone 150 mg every night,  She complains of bilateral lower extremity feeling hot, urge to move especially at nighttime,  REVIEW OF SYSTEMS: Full 14 system  review of systems performed and notable only for easy bruising, restless leg, murmur  ALLERGIES: Allergies  Allergen Reactions  . Nickel     Redness and discomfort ; contact dermatitis skin turns red    HOME MEDICATIONS: Current Outpatient Prescriptions  Medication Sig Dispense Refill  . amLODipine (NORVASC) 5 MG tablet Take 5 mg by mouth every morning.    . Calcium Carbonate-Vitamin D (CALCIUM + D PO) Take 600 mg by mouth 2 (two) times daily.     . cycloSPORINE (RESTASIS) 0.05 % ophthalmic emulsion Place 1 drop into both eyes 2 (two) times daily.     . Eflornithine HCl (VANIQA) 13.9 % cream Apply 1 application topically 2 (two) times daily.     . Glucosamine-Chondroitin (COSAMIN DS PO) Take 1 tablet by mouth 2 (two) times daily.      Marland Kitchen latanoprost (XALATAN) 0.005 % ophthalmic solution Place 1 drop into both eyes at bedtime.    Marland Kitchen levothyroxine (SYNTHROID, LEVOTHROID) 112 MCG tablet Take 56-112 mcg by mouth as directed.    . multivitamin (THERAGRAN) per tablet Take 1 tablet by mouth daily.      . pregabalin (LYRICA) 50 MG capsule Take 50 mg by mouth 2 (two) times daily.    . rosuvastatin (CRESTOR) 20 MG tablet Take 5 mg by mouth every Monday, Wednesday, and Friday.     . traZODone (DESYREL) 100 MG tablet TAKE ONE TABLET AT BEDTIME. (Patient taking differently: TAKE 150 MG BY MOUTH  AT BEDTIME.) 30 tablet 0   No current facility-administered medications for this visit.     PAST MEDICAL HISTORY: Past Medical History:  Diagnosis Date  . Anemia   . Aortic heart murmur   .  Arthritis   . Celiac disease    Dr Wynetta Emery  . Depression   . Diverticulitis of colon   . Glaucoma   . Glaucoma 2012  . Heart murmur   . History of colonic polyps    3 mm, Dr Wynetta Emery  . Hypertension   . Hypothyroidism   . Kidney stone    stone in left kidney now  . Osteopenia   . Peripheral neuropathy ([place])   . Spinal stenosis     PAST SURGICAL HISTORY: Past Surgical History:  Procedure Laterality Date   . APPENDECTOMY  1966   @ tubal  . BREAST CYST ASPIRATION Left 1983  . CATARACT EXTRACTION Bilateral yrs ago  . COLON BIOPSY  1998   Celiac Sprue; Dr Wynetta Emery  . COLONOSCOPY  2010   Tics& polyp  . COLONOSCOPY WITH PROPOFOL N/A 06/25/2014   Procedure: COLONOSCOPY WITH PROPOFOL;  Surgeon: Garlan Fair, MD;  Location: [facility];  Service: Endoscopy;  Laterality: N/A;  . ESOPHAGOGASTRODUODENOSCOPY (EGD) WITH PROPOFOL N/A 06/25/2014   Procedure: ESOPHAGOGASTRODUODENOSCOPY (EGD) WITH PROPOFOL;  Surgeon: Garlan Fair, MD;  Location: [facility];  Service: Endoscopy;  Laterality: N/A;  . HEMORRHOID SURGERY    . HERNIA REPAIR  1943   Right Side  . INGUINAL HERNIA REPAIR  11/16/2011   Procedure: HERNIA REPAIR INGUINAL ADULT;  Surgeon: Edward Jolly, MD;  Location: [facility];  Service: General;  Laterality: Left;  with mesh  . KNEE ARTHROSCOPY  2007   Left  . NOSE SURGERY  2007   Fractured Nose Reset  . orif broken ankle Left 2013  . POLYPECTOMY  2010   due 2015, 3 mm  . TONSILLECTOMY AND ADENOIDECTOMY  1941  . TUBAL LIGATION  1966    FAMILY HISTORY: Family History  Problem Relation Age of Onset  . Depression Mother     manic  . Heart attack Mother 64  . COPD Mother     heavy smoker  . Coronary artery disease Maternal Uncle     X 6  . Cancer Maternal Uncle     intestinal  . Diabetes Maternal Uncle     X2  . Breast cancer Paternal Aunt   . Depression      strong maternal hx with mania  . Diverticulitis      M uncle & M aunt  . Colon cancer      M aunt & M uncle  . Diabetes Maternal Aunt   . Stroke Neg Hx   . Asthma Neg Hx     SOCIAL HISTORY:  Social History   Social History  . Marital status: Widowed    Spouse name: N/A  . Number of children: 4  . Years of education: Bachelors +   Occupational History  . Retired    Social History Main Topics  . Smoking status: Former Smoker    Packs/day: 0.25    Years: 25.00    Types: Cigarettes    Quit date:  11/01/1984  . Smokeless tobacco: Never Used  . Alcohol use 0.0 oz/week     Comment: whiskey once month  . Drug use: No  . Sexual activity: No   Other Topics Concern  . Not on file   Social History Narrative   Lives at home alone.   Right-handed.   1 coke per day.        PHYSICAL EXAM   There were no vitals filed for this visit.  Not recorded  There is no height or weight on file to calculate BMI.  PHYSICAL EXAMNIATION:  Gen: NAD, conversant, well nourised, obese, well groomed                     Cardiovascular: Regular rate rhythm, no peripheral edema, warm, nontender. Eyes: Conjunctivae clear without exudates or hemorrhage Neck: Supple, no carotid bruits. Pulmonary: Clear to auscultation bilaterally   NEUROLOGICAL EXAM:  MENTAL STATUS: Speech:    Speech is normal; fluent and spontaneous with normal comprehension.  Cognition:     Orientation to time, place and person     Normal recent and remote memory     Normal Attention span and concentration     Normal Language, naming, repeating,spontaneous speech     Fund of knowledge   CRANIAL NERVES: CN II: Visual fields are full to confrontation. Fundoscopic exam is normal with sharp discs and no vascular changes. Pupils are round equal and briskly reactive to light. CN III, IV, VI: extraocular movement are normal. No ptosis. CN V: Facial sensation is intact to pinprick in all 3 divisions bilaterally. Corneal responses are intact.  CN VII: Face is symmetric with normal eye closure and smile. CN VIII: Hearing is normal to rubbing fingers CN IX, X: Palate elevates symmetrically. Phonation is normal. CN XI: Head turning and shoulder shrug are intact CN XII: Tongue is midline with normal movements and no atrophy.  MOTOR: There is no pronator drift of out-stretched arms. Muscle bulk and tone are normal. Muscle strength is normal.  REFLEXES: Reflexes are 3 and symmetric at the biceps, triceps, knees, and ankles.  Plantar responses are extensor bilaterally  SENSORY: Mildly length dependent decreased to light touch, pinprick in feet.preserved to vibratory sensation.  COORDINATION: Rapid alternating movements and fine finger movements are intact. There is no dysmetria on finger-to-nose and heel-knee-shin.    GAIT/STANCE: Posture is normal. Gait is steady with normal steps, base, arm swing, and turning.   Romberg is absent.   DIAGNOSTIC DATA (LABS, IMAGING, TESTING) - I reviewed patient records, labs, notes, testing and imaging myself where available.   ASSESSMENT AND PLAN  Krista Carlson is a 83 y.o. female   Progressive ascending paresthesia,   Possible small fiber neuropathy, symptoms also suggested some restless leg component.  MRI cervical: Showed multilevel degenerative changes but there was no evidence of cord compression  EMG/NCS: showed no large fiber disease  Skin biopsy  Change Lyrica to 50 mg 2-3 tablets every night  History of celiac disease, vitamin B12 deficiency, on gluten-free diet, vitamin B12 IM supplement.   Bring lab result from her primary care physician       Marcial Pacas, M.D. Ph.D.  [facility] [address] [place], [place] [address] Ph: [phone] Fax: [phone]  CC: Crist Infante, MD

## 2016-12-31 NOTE — Procedures (Signed)
Full Name: Krista Carlson Gender: Female MRN #: [id number] Date of Birth: 13-Jun-1932    Visit Date: 12/31/2016 08:27 Age: 83 Years 96 Months Old Examining Physician: Marcial Pacas, MD  Referring Physician: Krista Blue History: 83 years old female with more than 10 years history of bilateral lower extremity paresthesia, ascending to bilateral knee level now  Summary of test:  Nerve conduction study: Bilateral sural, superficial peroneal sensory responses showed no significant abnormality. Bilateral peroneal to EDB, tibial motor responses were normal.  Electromyography: Selective needle examination of bilateral lower extremity muscles and bilateral lumbar sacral paraspinal muscles was normal.  Conclusion: This is a normal study. There is no evidence of large fiber peripheral neuropathy or bilateral lumbar sacral radiculopathy.    ------------------------------- Marcial Pacas, M.D.  [facility] [place], [place] [address] Tel: [phone] Fax: [phone]        [facility]    Nerve / Sites Rec. Site Peak Lat Ref. Amp.1-2 Ref. Distance    ms ms V V cm  R Sural - Ankle (Calf)     Calf Ankle 3.65 ?4.40 4.3 ?6.0 14  L Sural - Ankle (Calf)     Calf Ankle 3.85 ?4.40 6.3 ?6.0 14  R Superficial peroneal - Ankle     Lat leg Ankle 4.84 ?4.40 2.9 ?6.0 14  L Superficial peroneal - Ankle     Lat leg Ankle 4.17 ?4.40 6.9 ?6.0 14     [facility]    Nerve / Sites Muscle Latency Ref. Amplitude Ref. Rel Amp Segments Distance Lat Diff Velocity Ref. Area    ms ms mV mV %  cm ms m/s m/s mVms  R Peroneal - EDB     Ankle EDB 5.1 ?6.5 5.7 ?2.0 100 Ankle - EDB 9    16.2     Fib head EDB 9.9  5.0  88 Fib head - Ankle 21 4.9 43 ?44 13.7     Pop fossa EDB 12.3  5.2  103 Pop fossa - Fib head 10 2.3 43 ?44 15.4         Acc Peron - Pop fossa       L Peroneal - EDB     Ankle EDB 4.8 ?6.5 4.1 ?2.0 100 Ankle - EDB 9    10.5     Fib head EDB 10.3  3.8  92.7 Fib head - Ankle 23 5.5 42 ?44 10.0      Pop fossa EDB 12.4  3.7  96 Pop fossa - Fib head 9 2.1 42 ?44 10.1         Pop fossa - Ankle  7.6     R Tibial - AH     Ankle AH 4.7 ?5.8 16.7 ?4.0 100 Ankle - AH 9    35.3     Pop fossa AH 12.0  12.6  75.7 Pop fossa - Ankle 30 7.3 41 ?41 30.8  L Tibial - AH     Ankle AH 4.7 ?5.8 15.7 ?4.0 100 Ankle - AH 9    36.2     Pop fossa AH 12.0  12.2  77.9 Pop fossa - Ankle 30 7.3 41 ?41 33.0     F  Wave    Nerve F Lat Ref.   ms ms  R Tibial - AH 54.1 ?56.0  L Tibial - AH 53.6 ?56.0     EMG full       EMG Summary Table    Spontaneous  MUAP Recruitment  Muscle IA Fib PSW Fasc Other Amp Dur. Poly Pattern  R. Tibialis posterior Normal None None None _______ Normal Normal Normal Normal  R. Tibialis anterior Normal None None None _______ Normal Normal Normal Normal  R. Peroneus longus Normal None None None _______ Normal Normal Normal Normal  R. Gastrocnemius (Medial head) Normal None None None _______ Normal Normal Normal Normal  R. Vastus lateralis Normal None None None _______ Normal Normal Normal Normal  L. Tibialis anterior Normal None None None _______ Normal Normal Normal Normal  L. Gastrocnemius (Medial head) Normal None None None _______ Normal Normal Normal Normal  L. Vastus lateralis Normal None None None _______ Normal Normal Normal Normal  L. Lumbar paraspinals (mid) Normal None None None _______ Normal Normal Normal Normal  L. Lumbar paraspinals (low) Normal None None None _______ Normal Normal Normal Normal  R. Lumbar paraspinals (mid) Normal None None None _______ Normal Normal Normal Normal  R. Lumbar paraspinals (low) Normal None None None _______ Normal Normal Normal Normal  R. Abductor hallucis Normal None None None _______ Normal Normal Normal Normal

## 2017-01-05 ENCOUNTER — Ambulatory Visit
Admission: RE | Admit: 2017-01-05 | Discharge: 2017-01-05 | Disposition: A | Discharge status: Home / Self Care | Payer: Medicare Other | Source: Ambulatory Visit | Attending: Internal Medicine | Admitting: Internal Medicine

## 2017-01-05 DIAGNOSIS — N63 Unspecified lump in unspecified breast: Secondary | ICD-10-CM

## 2017-01-05 DIAGNOSIS — R928 Other abnormal and inconclusive findings on diagnostic imaging of breast: Secondary | ICD-10-CM | POA: Diagnosis not present

## 2017-01-05 DIAGNOSIS — N6489 Other specified disorders of breast: Secondary | ICD-10-CM | POA: Diagnosis not present

## 2017-01-06 DIAGNOSIS — F329 Major depressive disorder, single episode, unspecified: Secondary | ICD-10-CM | POA: Diagnosis not present

## 2017-01-18 ENCOUNTER — Ambulatory Visit (INDEPENDENT_AMBULATORY_CARE_PROVIDER_SITE_OTHER): Payer: Medicare Other | Admitting: Neurology

## 2017-01-18 ENCOUNTER — Encounter: Payer: Self-pay | Admitting: Neurology

## 2017-01-18 VITALS — BP 138/78 | HR 57 | Ht 60.0 in

## 2017-01-18 DIAGNOSIS — G629 Polyneuropathy, unspecified: Secondary | ICD-10-CM | POA: Diagnosis not present

## 2017-01-18 DIAGNOSIS — E538 Deficiency of other specified B group vitamins: Secondary | ICD-10-CM

## 2017-01-18 DIAGNOSIS — R202 Paresthesia of skin: Secondary | ICD-10-CM | POA: Diagnosis not present

## 2017-01-18 DIAGNOSIS — G2581 Restless legs syndrome: Secondary | ICD-10-CM | POA: Diagnosis not present

## 2017-01-18 MED ORDER — ROPINIROLE HCL 0.5 MG PO TABS: 1.0000 mg | ORAL_TABLET | Freq: Every day | ORAL | 11 refills | Status: DC

## 2017-01-18 NOTE — Progress Notes (Signed)
Patient was in right lateral recombinant position. Sterile technique. 1% lidocaine with epinephrine was used for local anesthesia. Punctuated skin biopsy was performed. 3 mm skin sample were obtained at at left foot, above left extensor digitorum brevis, and left lateral calf, 10 cm above lateral malleolus, lateral thigh, 20 cm below superior iliac spine.  Patient tolerated the procedure well.  The wound was covered with neosporin antibiotic cream and bandage. 

## 2017-01-18 NOTE — Progress Notes (Signed)
PATIENT: Krista Carlson DOB: 1932-01-14  Chief Complaint  Patient presents with  . Numbness    Skin Biopsy     HISTORICAL  Krista Carlson is a 82 years old right-handed female, seen in refer by her primary care doctor Crist Infante for evaluation of peripheral neuropathy, initial evaluation was on November 15 2016.  She had past medical history of hypertension, hypothyroidism, on supplement, 1998 biopsy proven celiac disease, on gluten-free diet, she presented with uncontrolled diarrhea for 6 months, had a history of vitamin B12 deficiency, she is still receiving vitamin B-12 intramuscular injection once a month, hyperlipidemia, kidney stone, left ankle fracture in 2013, status post surgical fixation,   She noticed gradual onset bilateral feet paresthesia around 2005, slowly progressive over the years, now ascending to below bilateral knee level, she described constant numb tingling occasionally achy sensation, especially at nighttime, she could not tell if her heating pad is on and off because of constant burning sensation at her lower extremity. She denies weakness, she denies finger paresthesia, she denies significant low back pain, does has left-sided neck strain,  She also has chronic insomnia, is taking trazodone 150 mg at night p.m., go to sleep around 10 PM, she has never tried neuropathic pain medications, was given a prescription of Lyrica 50 mg twice a day just recently.  Update January 18 2017: I reviewed laboratory evaluations, normal creatinine 0.8, WBC 5.1, hemoglobin 14 point 2, LDL 106, normal TSH, free T4, vitamin B12 738,  She has tried Lyrica 50 mg up to 2 tablets every night without helping her symptoms, complains of alternating hot and cold sensation urge to move her feet at nighttime Electrodiagnostic study December 31 2016 showed no large fiber peripheral neuropathy   REVIEW OF SYSTEMS: Full 14 system review of systems performed and notable only for easy bruising, restless  leg, murmur  ALLERGIES: Allergies  Allergen Reactions  . Nickel     Redness and discomfort ; contact dermatitis skin turns red    HOME MEDICATIONS: Current Outpatient Prescriptions  Medication Sig Dispense Refill  . amLODipine (NORVASC) 5 MG tablet Take 5 mg by mouth every morning.    . Calcium Carbonate-Vitamin D (CALCIUM + D PO) Take 600 mg by mouth 2 (two) times daily.     . cycloSPORINE (RESTASIS) 0.05 % ophthalmic emulsion Place 1 drop into both eyes 2 (two) times daily.     . Eflornithine HCl (VANIQA) 13.9 % cream Apply 1 application topically 2 (two) times daily.     . Glucosamine-Chondroitin (COSAMIN DS PO) Take 1 tablet by mouth 2 (two) times daily.      Marland Kitchen latanoprost (XALATAN) 0.005 % ophthalmic solution Place 1 drop into both eyes at bedtime.    Marland Kitchen levothyroxine (SYNTHROID, LEVOTHROID) 112 MCG tablet Take 56-112 mcg by mouth as directed.    . multivitamin (THERAGRAN) per tablet Take 1 tablet by mouth daily.      . pregabalin (LYRICA) 50 MG capsule Take 50 mg by mouth 2 (two) times daily.    . rosuvastatin (CRESTOR) 20 MG tablet Take 5 mg by mouth every Monday, Wednesday, and Friday.     . traZODone (DESYREL) 100 MG tablet TAKE ONE TABLET AT BEDTIME. (Patient taking differently: TAKE 150 MG BY MOUTH  AT BEDTIME.) 30 tablet 0   No current facility-administered medications for this visit.     PAST MEDICAL HISTORY: Past Medical History:  Diagnosis Date  . Anemia   . Aortic heart murmur   .  Arthritis   . Celiac disease    Dr Wynetta Emery  . Depression   . Diverticulitis of colon   . Glaucoma   . Glaucoma 2012  . Heart murmur   . History of colonic polyps    3 mm, Dr Wynetta Emery  . Hypertension   . Hypothyroidism   . Kidney stone    stone in left kidney now  . Osteopenia   . Peripheral neuropathy ([place])   . Spinal stenosis     PAST SURGICAL HISTORY: Past Surgical History:  Procedure Laterality Date  . APPENDECTOMY  1966   @ tubal  . BREAST CYST ASPIRATION Left 1983    . CATARACT EXTRACTION Bilateral yrs ago  . COLON BIOPSY  1998   Celiac Sprue; Dr Wynetta Emery  . COLONOSCOPY  2010   Tics& polyp  . COLONOSCOPY WITH PROPOFOL N/A 06/25/2014   Procedure: COLONOSCOPY WITH PROPOFOL;  Surgeon: Garlan Fair, MD;  Location: [facility];  Service: Endoscopy;  Laterality: N/A;  . ESOPHAGOGASTRODUODENOSCOPY (EGD) WITH PROPOFOL N/A 06/25/2014   Procedure: ESOPHAGOGASTRODUODENOSCOPY (EGD) WITH PROPOFOL;  Surgeon: Garlan Fair, MD;  Location: [facility];  Service: Endoscopy;  Laterality: N/A;  . HEMORRHOID SURGERY    . HERNIA REPAIR  1943   Right Side  . INGUINAL HERNIA REPAIR  11/16/2011   Procedure: HERNIA REPAIR INGUINAL ADULT;  Surgeon: Edward Jolly, MD;  Location: [facility];  Service: General;  Laterality: Left;  with mesh  . KNEE ARTHROSCOPY  2007   Left  . NOSE SURGERY  2007   Fractured Nose Reset  . orif broken ankle Left 2013  . POLYPECTOMY  2010   due 2015, 3 mm  . TONSILLECTOMY AND ADENOIDECTOMY  1941  . TUBAL LIGATION  1966    FAMILY HISTORY: Family History  Problem Relation Age of Onset  . Depression Mother     manic  . Heart attack Mother 16  . COPD Mother     heavy smoker  . Coronary artery disease Maternal Uncle     X 6  . Cancer Maternal Uncle     intestinal  . Diabetes Maternal Uncle     X2  . Breast cancer Paternal Aunt   . Depression      strong maternal hx with mania  . Diverticulitis      M uncle & M aunt  . Colon cancer      M aunt & M uncle  . Diabetes Maternal Aunt   . Stroke Neg Hx   . Asthma Neg Hx     SOCIAL HISTORY:  Social History   Social History  . Marital status: Widowed    Spouse name: N/A  . Number of children: 4  . Years of education: Bachelors +   Occupational History  . Retired    Social History Main Topics  . Smoking status: Former Smoker    Packs/day: 0.25    Years: 25.00    Types: Cigarettes    Quit date: 11/01/1984  . Smokeless tobacco: Never Used  . Alcohol use 0.0 oz/week      Comment: whiskey once month  . Drug use: No  . Sexual activity: No   Other Topics Concern  . Not on file   Social History Narrative   Lives at home alone.   Right-handed.   1 coke per day.        PHYSICAL EXAM   Vitals:   01/18/17 1327  BP: 138/78  Pulse: (!) 57  Height:  5' (1.524 m)    Not recorded      There is no height or weight on file to calculate BMI.  PHYSICAL EXAMNIATION:  Gen: NAD, conversant, well nourised, obese, well groomed                     Cardiovascular: Regular rate rhythm, no peripheral edema, warm, nontender. Eyes: Conjunctivae clear without exudates or hemorrhage Neck: Supple, no carotid bruits. Pulmonary: Clear to auscultation bilaterally   NEUROLOGICAL EXAM:  MENTAL STATUS: Speech:    Speech is normal; fluent and spontaneous with normal comprehension.  Cognition:     Orientation to time, place and person     Normal recent and remote memory     Normal Attention span and concentration     Normal Language, naming, repeating,spontaneous speech     Fund of knowledge   CRANIAL NERVES: CN II: Visual fields are full to confrontation. Fundoscopic exam is normal with sharp discs and no vascular changes. Pupils are round equal and briskly reactive to light. CN III, IV, VI: extraocular movement are normal. No ptosis. CN V: Facial sensation is intact to pinprick in all 3 divisions bilaterally. Corneal responses are intact.  CN VII: Face is symmetric with normal eye closure and smile. CN VIII: Hearing is normal to rubbing fingers CN IX, X: Palate elevates symmetrically. Phonation is normal. CN XI: Head turning and shoulder shrug are intact CN XII: Tongue is midline with normal movements and no atrophy.  MOTOR: There is no pronator drift of out-stretched arms. Muscle bulk and tone are normal. Muscle strength is normal.  REFLEXES: Reflexes are 2 and symmetric at the biceps, triceps, knees, and ankles. Plantar responses are extensor  bilaterally  SENSORY: Mildly length dependent decreased to light touch, pinprick in feet.preserved to vibratory sensation.  COORDINATION: Rapid alternating movements and fine finger movements are intact. There is no dysmetria on finger-to-nose and heel-knee-shin.    GAIT/STANCE: Posture is normal. Gait is steady with normal steps, base, arm swing, and turning.   Romberg is absent.   DIAGNOSTIC DATA (LABS, IMAGING, TESTING) - I reviewed patient records, labs, notes, testing and imaging myself where available.   ASSESSMENT AND PLAN  Krista Carlson is a 83 y.o. female   Progressive ascending paresthesia  EMG nerve conduction study showed no large fiber neuropathy  Laboratory evaluation showed no significant etiology    Skin biopsy today,  Symptoms suggestive of restless leg symptoms, tried and failed gabapentin, Lyrica, will try low-dose Requip 0.5 milligrams titrating to 2 tablets every night History of celiac disease, vitamin B12 deficiency, on gluten-free diet, vitamin B12 IM supplement.     Marcial Pacas, M.D. Ph.D.  [facility] [address], [place],  [address] Ph: [phone] Fax: [phone]  CC: Crist Infante, MD

## 2017-01-26 ENCOUNTER — Encounter: Payer: Self-pay | Admitting: *Deleted

## 2017-01-26 ENCOUNTER — Telehealth: Payer: Self-pay | Admitting: *Deleted

## 2017-01-26 NOTE — Telephone Encounter (Signed)
Dr. Krista Blue has reviewed the results and the patient has been notified.  She just increased her Requip 0.5mg  to two tablets last night. She will let us know if this medication is not helpful.

## 2017-01-26 NOTE — Telephone Encounter (Signed)
Skin biopsy results received from [place] (collected 01/18/17):  Diagnosis: A) Rt Thigh - skin with significantly reduced Epidermal Nerve Fiber Density, consistent with small fiber neuropathy. B) Rt Calf -  skin with significantly reduced Epidermal Nerve Fiber Density, consistent with small fiber neuropathy. C Rt Foot - skin with significantly reduced Epidermal Nerve Fiber Density, consistent with small fiber neuropathy.

## 2017-02-22 DIAGNOSIS — D225 Melanocytic nevi of trunk: Secondary | ICD-10-CM | POA: Diagnosis not present

## 2017-02-22 DIAGNOSIS — D692 Other nonthrombocytopenic purpura: Secondary | ICD-10-CM | POA: Diagnosis not present

## 2017-02-22 DIAGNOSIS — L821 Other seborrheic keratosis: Secondary | ICD-10-CM | POA: Diagnosis not present

## 2017-02-22 DIAGNOSIS — L680 Hirsutism: Secondary | ICD-10-CM | POA: Diagnosis not present

## 2017-03-03 NOTE — Telephone Encounter (Signed)
Pt called said the requip has not helped at all with the numb and tingling in legs and feet. Please call to discuss

## 2017-03-04 MED ORDER — CLONAZEPAM 0.5 MG PO TABS
0.5000 mg | ORAL_TABLET | Freq: Every day | ORAL | 2 refills | Status: DC
Start: 2017-03-04 — End: 2017-07-13

## 2017-03-04 NOTE — Telephone Encounter (Signed)
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  This prescription has been faxed to [facility].  I called the patient back and left a detailed message, at her request.  Provided our number to call back with any questions.

## 2017-03-04 NOTE — Telephone Encounter (Addendum)
Spoke to patient - she is still having moderate numbness/tingling in her legs and feet.  She is currently taking Requip 1mg  at bedtime. She will be in a class from 10am to 4pm today and has asked that a detailed message be left for her.

## 2017-03-04 NOTE — Addendum Note (Signed)
Addended by: Noberto Retort C on: 03/04/2017 10:35 AM   Modules accepted: Orders

## 2017-04-19 ENCOUNTER — Ambulatory Visit (INDEPENDENT_AMBULATORY_CARE_PROVIDER_SITE_OTHER): Payer: Medicare Other | Admitting: Neurology

## 2017-04-19 ENCOUNTER — Encounter: Payer: Self-pay | Admitting: Neurology

## 2017-04-19 VITALS — BP 116/73 | HR 72 | Ht 60.0 in | Wt 132.0 lb

## 2017-04-19 DIAGNOSIS — R52 Pain, unspecified: Secondary | ICD-10-CM | POA: Diagnosis not present

## 2017-04-19 DIAGNOSIS — G629 Polyneuropathy, unspecified: Secondary | ICD-10-CM

## 2017-04-19 DIAGNOSIS — R799 Abnormal finding of blood chemistry, unspecified: Secondary | ICD-10-CM | POA: Diagnosis not present

## 2017-04-19 NOTE — Progress Notes (Signed)
PATIENT: Krista Carlson DOB: Dec 28, 1931  Chief Complaint  Patient presents with  . Numbness and tingling    Reports no improvement in her symptoms with the combination of Requip and Klonopin.  She is still having constant numbness and tingling in her lower legs/feet.  She also feels the medication is causing her to feel mildly unsteady.     HISTORICAL  Krista Carlson is a 83 years old right-handed female, seen in refer by her primary care doctor Crist Infante for evaluation of peripheral neuropathy, initial evaluation was on November 15 2016.  She had past medical history of hypertension, hypothyroidism, on supplement, 1998 biopsy proven celiac disease, on gluten-free diet, she presented with uncontrolled diarrhea for 6 months, had a history of vitamin B12 deficiency, she is still receiving vitamin B-12 intramuscular injection once a month, hyperlipidemia, kidney stone, left ankle fracture in 2013, status post surgical fixation,   She noticed gradual onset bilateral feet paresthesia around 2005, slowly progressive over the years, now ascending to below bilateral knee level, she described constant numb tingling occasionally achy sensation, especially at nighttime, she could not tell if her heating pad is on and off because of constant burning sensation at her lower extremity. She denies weakness, she denies finger paresthesia, she denies significant low back pain, does has left-sided neck strain,  She also has chronic insomnia, is taking trazodone 150 mg at night p.m., go to sleep around 10 PM, she has never tried neuropathic pain medications, was given a prescription of Lyrica 50 mg twice a day just recently.  Update January 18 2017: I reviewed laboratory evaluations, normal creatinine 0.8, WBC 5.1, hemoglobin 14 point 2, LDL 106, normal TSH, free T4, vitamin B12 738,  She has tried Lyrica 50 mg up to 2 tablets every night without helping her symptoms, complains of alternating hot and cold sensation  urge to move her feet at nighttime Electrodiagnostic study December 31 2016 showed no large fiber peripheral neuropathy  UPDATE April 19 2017: Skin biopsy did confirm small fiber neuropathy in March 2018  I reviewed extensive laboratory evaluations in March 2018  EMG nerve conduction study in March 2018 was within normal limits.  Has tried Requip, clonazepam, did not help her symptoms, make her more dizzy, tired at nighttime  REVIEW OF SYSTEMS: Full 14 system review of systems performed and notable only for as above ALLERGIES: Allergies  Allergen Reactions  . Nickel     Redness and discomfort ; contact dermatitis skin turns red    HOME MEDICATIONS: Current Outpatient Prescriptions  Medication Sig Dispense Refill  . amLODipine (NORVASC) 5 MG tablet Take 5 mg by mouth every morning.    . Calcium Carbonate-Vitamin D (CALCIUM + D PO) Take 600 mg by mouth 2 (two) times daily.     . clonazePAM (KLONOPIN) 0.5 MG tablet Take 1 tablet (0.5 mg total) by mouth at bedtime. 30 tablet 2  . cycloSPORINE (RESTASIS) 0.05 % ophthalmic emulsion Place 1 drop into both eyes 2 (two) times daily.     . Eflornithine HCl (VANIQA) 13.9 % cream Apply 1 application topically 2 (two) times daily.     . Glucosamine-Chondroitin (COSAMIN DS PO) Take 1 tablet by mouth 2 (two) times daily.      Marland Kitchen latanoprost (XALATAN) 0.005 % ophthalmic solution Place 1 drop into both eyes at bedtime.    Marland Kitchen levothyroxine (SYNTHROID, LEVOTHROID) 112 MCG tablet Take 56-112 mcg by mouth as directed.    . multivitamin (THERAGRAN) per  tablet Take 1 tablet by mouth daily.      Marland Kitchen rOPINIRole (REQUIP) 0.5 MG tablet Take 2 tablets (1 mg total) by mouth at bedtime. 60 tablet 11  . rosuvastatin (CRESTOR) 20 MG tablet Take 5 mg by mouth every Monday, Wednesday, and Friday.     . traZODone (DESYREL) 100 MG tablet TAKE ONE TABLET AT BEDTIME. (Patient taking differently: TAKE 150 MG BY MOUTH  AT BEDTIME.) 30 tablet 0   No current facility-administered  medications for this visit.     PAST MEDICAL HISTORY: Past Medical History:  Diagnosis Date  . Anemia   . Aortic heart murmur   . Arthritis   . Celiac disease    Dr Wynetta Emery  . Depression   . Diverticulitis of colon   . Glaucoma   . Glaucoma 2012  . Heart murmur   . History of colonic polyps    3 mm, Dr Wynetta Emery  . Hypertension   . Hypothyroidism   . Kidney stone    stone in left kidney now  . Osteopenia   . Peripheral neuropathy   . Spinal stenosis     PAST SURGICAL HISTORY: Past Surgical History:  Procedure Laterality Date  . APPENDECTOMY  1966   @ tubal  . BREAST CYST ASPIRATION Left 1983  . CATARACT EXTRACTION Bilateral yrs ago  . COLON BIOPSY  1998   Celiac Sprue; Dr Wynetta Emery  . COLONOSCOPY  2010   Tics& polyp  . COLONOSCOPY WITH PROPOFOL N/A 06/25/2014   Procedure: COLONOSCOPY WITH PROPOFOL;  Surgeon: Garlan Fair, MD;  Location: [facility];  Service: Endoscopy;  Laterality: N/A;  . ESOPHAGOGASTRODUODENOSCOPY (EGD) WITH PROPOFOL N/A 06/25/2014   Procedure: ESOPHAGOGASTRODUODENOSCOPY (EGD) WITH PROPOFOL;  Surgeon: Garlan Fair, MD;  Location: [facility];  Service: Endoscopy;  Laterality: N/A;  . HEMORRHOID SURGERY    . HERNIA REPAIR  1943   Right Side  . INGUINAL HERNIA REPAIR  11/16/2011   Procedure: HERNIA REPAIR INGUINAL ADULT;  Surgeon: Edward Jolly, MD;  Location: [facility];  Service: General;  Laterality: Left;  with mesh  . KNEE ARTHROSCOPY  2007   Left  . NOSE SURGERY  2007   Fractured Nose Reset  . orif broken ankle Left 2013  . POLYPECTOMY  2010   due 2015, 3 mm  . TONSILLECTOMY AND ADENOIDECTOMY  1941  . TUBAL LIGATION  1966    FAMILY HISTORY: Family History  Problem Relation Age of Onset  . Depression Mother        manic  . Heart attack Mother 8  . COPD Mother        heavy smoker  . Coronary artery disease Maternal Uncle        X 6  . Cancer Maternal Uncle        intestinal  . Diabetes Maternal Uncle        X2  .  Breast cancer Paternal Aunt   . Depression Unknown        strong maternal hx with mania  . Diverticulitis Unknown        M uncle & M aunt  . Colon cancer Unknown        M aunt & M uncle  . Diabetes Maternal Aunt   . Stroke Neg Hx   . Asthma Neg Hx     SOCIAL HISTORY:  Social History   Social History  . Marital status: Widowed    Spouse name: N/A  . Number of children: 4  .  Years of education: Bachelors +   Occupational History  . Retired    Social History Main Topics  . Smoking status: Former Smoker    Packs/day: 0.25    Years: 25.00    Types: Cigarettes    Quit date: 11/01/1984  . Smokeless tobacco: Never Used  . Alcohol use 0.0 oz/week     Comment: whiskey once month  . Drug use: No  . Sexual activity: No   Other Topics Concern  . Not on file   Social History Narrative   Lives at home alone.   Right-handed.   1 coke per day.        PHYSICAL EXAM   Vitals:   04/19/17 1116  BP: 116/73  Pulse: 72  Weight: 132 lb (59.9 kg)  Height: 5' (1.524 m)    Not recorded      Body mass index is 25.78 kg/m.  PHYSICAL EXAMNIATION:  Gen: NAD, conversant, well nourised, obese, well groomed                     Cardiovascular: Regular rate rhythm, no peripheral edema, warm, nontender. Eyes: Conjunctivae clear without exudates or hemorrhage Neck: Supple, no carotid bruits. Pulmonary: Clear to auscultation bilaterally   NEUROLOGICAL EXAM:  MENTAL STATUS: Speech:    Speech is normal; fluent and spontaneous with normal comprehension.  Cognition:     Orientation to time, place and person     Normal recent and remote memory     Normal Attention span and concentration     Normal Language, naming, repeating,spontaneous speech     Fund of knowledge   CRANIAL NERVES: CN II: Visual fields are full to confrontation. Fundoscopic exam is normal with sharp discs and no vascular changes. Pupils are round equal and briskly reactive to light. CN III, IV, VI: extraocular  movement are normal. No ptosis. CN V: Facial sensation is intact to pinprick in all 3 divisions bilaterally. Corneal responses are intact.  CN VII: Face is symmetric with normal eye closure and smile. CN VIII: Hearing is normal to rubbing fingers CN IX, X: Palate elevates symmetrically. Phonation is normal. CN XI: Head turning and shoulder shrug are intact CN XII: Tongue is midline with normal movements and no atrophy.  MOTOR: There is no pronator drift of out-stretched arms. Muscle bulk and tone are normal. Muscle strength is normal.  REFLEXES: Reflexes are 2 and symmetric at the biceps, triceps, knees, and ankles. Plantar responses are extensor bilaterally  SENSORY: Mildly length dependent decreased to light touch, pinprick in feet.preserved to vibratory sensation.  COORDINATION: Rapid alternating movements and fine finger movements are intact. There is no dysmetria on finger-to-nose and heel-knee-shin.    GAIT/STANCE: Posture is normal. Gait is steady with normal steps, base, arm swing, and turning.   Romberg is absent.   DIAGNOSTIC DATA (LABS, IMAGING, TESTING) - I reviewed patient records, labs, notes, testing and imaging myself where available.   ASSESSMENT AND PLAN  CRISTLE JARED is a 83 y.o. female    Progressive ascending paresthesia  EMG nerve conduction study showed no large fiber neuropathy  Laboratory evaluation showed no significant etiology    Skin biopsy in March 2018 conformer diagnosis of small vessel disease,  Symptoms suggestive of restless leg symptoms, tried and failed gabapentin, Lyrica, will try low-dose Requip 0.5mg  and clonazepam 0.5 milligram,   History of celiac disease, vitamin B12 deficiency, on gluten-free diet, vitamin B12 IM supplement.  Check laboratory evaluation to look for the  etiology of her small vessel disease   Marcial Pacas, M.D. Ph.D.  [facility] [address] [place], [place] [address] Ph: [phone] Fax: [phone]  CC: Crist Infante, MD

## 2017-04-22 LAB — PROTEIN ELECTROPHORESIS
A/G RATIO SPE: 1.5 (ref 0.7–1.7)
ALBUMIN ELP: 4 g/dL (ref 2.9–4.4)
Alpha 1: 0.2 g/dL (ref 0.0–0.4)
Alpha 2: 0.7 g/dL (ref 0.4–1.0)
Beta: 1 g/dL (ref 0.7–1.3)
Gamma Globulin: 0.7 g/dL (ref 0.4–1.8)
Globulin, Total: 2.6 g/dL (ref 2.2–3.9)

## 2017-04-22 LAB — IMMUNOFIXATION ELECTROPHORESIS
IGG (IMMUNOGLOBIN G), SERUM: 754 mg/dL (ref 700–1600)
IgA/Immunoglobulin A, Serum: 98 mg/dL (ref 64–422)
IgM (Immunoglobulin M), Srm: 34 mg/dL (ref 26–217)
Total Protein: 6.6 g/dL (ref 6.0–8.5)

## 2017-04-22 LAB — HGB A1C W/O EAG: HEMOGLOBIN A1C: 5.2 % (ref 4.8–5.6)

## 2017-04-22 LAB — B. BURGDORFI ANTIBODIES

## 2017-04-22 LAB — FERRITIN: Ferritin: 117 ng/mL (ref 15–150)

## 2017-04-22 LAB — C-REACTIVE PROTEIN: CRP: 0.6 mg/L (ref 0.0–4.9)

## 2017-04-22 LAB — ANA W/REFLEX IF POSITIVE: Anti Nuclear Antibody(ANA): NEGATIVE

## 2017-04-22 LAB — CK: Total CK: 42 U/L (ref 24–173)

## 2017-04-22 LAB — SEDIMENTATION RATE: Sed Rate: 2 mm/hr (ref 0–40)

## 2017-04-22 LAB — COPPER, SERUM: COPPER: 98 ug/dL (ref 72–166)

## 2017-04-22 NOTE — Progress Notes (Signed)
Labs reviewed for Dr. Krista Blue.  Please call pt: labs are normal.  Continue plan as discussed during recent clinic visit.  Star Age, MD, PhD [facility])

## 2017-04-26 ENCOUNTER — Ambulatory Visit: Payer: Medicare Other | Admitting: Neurology

## 2017-06-23 DIAGNOSIS — H35033 Hypertensive retinopathy, bilateral: Secondary | ICD-10-CM | POA: Diagnosis not present

## 2017-06-23 DIAGNOSIS — H401132 Primary open-angle glaucoma, bilateral, moderate stage: Secondary | ICD-10-CM | POA: Diagnosis not present

## 2017-06-23 DIAGNOSIS — H04123 Dry eye syndrome of bilateral lacrimal glands: Secondary | ICD-10-CM | POA: Diagnosis not present

## 2017-06-23 DIAGNOSIS — H524 Presbyopia: Secondary | ICD-10-CM | POA: Diagnosis not present

## 2017-07-11 ENCOUNTER — Telehealth: Payer: Self-pay | Admitting: Neurology

## 2017-07-11 NOTE — Telephone Encounter (Signed)
Pt called in she has increased pain, burning and tingling knees to the feet. Pt said she is not taking a specific medication for the neuropathy. Pt wanted to schedule an appt with Dr Krista Blue but she is booked into November. An appt was scheduled with [facility] for 9/12.  FYI

## 2017-07-13 ENCOUNTER — Ambulatory Visit (INDEPENDENT_AMBULATORY_CARE_PROVIDER_SITE_OTHER): Payer: Medicare Other | Admitting: Adult Health

## 2017-07-13 VITALS — BP 115/68 | HR 59 | Wt 132.0 lb

## 2017-07-13 DIAGNOSIS — G629 Polyneuropathy, unspecified: Secondary | ICD-10-CM

## 2017-07-13 MED ORDER — GABAPENTIN 100 MG PO CAPS: 100.0000 mg | ORAL_CAPSULE | Freq: Three times a day (TID) | ORAL | 5 refills | Status: DC

## 2017-07-13 NOTE — Progress Notes (Signed)
PATIENT: Krista Carlson DOB: [birth year]  REASON FOR VISIT: follow up HISTORY FROM: patient  HISTORY OF PRESENT ILLNESS: HISTORY JERE VANBUREN is a 83 years old right-handed female, seen in refer by her primary care doctor Crist Infante for evaluation of peripheral neuropathy, initial evaluation was on November 15 2016.  She had past medical history of hypertension, hypothyroidism, on supplement, 1998 biopsy proven celiac disease, on gluten-free diet, she presented with uncontrolled diarrhea for 6 months, had a history of vitamin B12 deficiency, she is still receiving vitamin B-12 intramuscular injection once a month, hyperlipidemia, kidney stone, left ankle fracture in 2013, status post surgical fixation,   She noticed gradual onset bilateral feet paresthesia around 2005, slowly progressive over the years, now ascending to below bilateral knee level, she described constant numb tingling occasionally achy sensation, especially at nighttime, she could not tell if her heating pad is on and off because of constant burning sensation at her lower extremity. She denies weakness, she denies finger paresthesia, she denies significant low back pain, does has left-sided neck strain,  She also has chronic insomnia, is taking trazodone 150 mg at night p.m., go to sleep around 10 PM, she has never tried neuropathic pain medications, was given a prescription of Lyrica 50 mg twice a day just recently.  Update January 18 2017: I reviewed laboratory evaluations, normal creatinine 0.8, WBC 5.1, hemoglobin 14 point 2, LDL 106, normal TSH, free T4, vitamin B12 738,  She has tried Lyrica 50 mg up to 2 tablets every night without helping her symptoms, complains of alternating hot and cold sensation urge to move her feet at nighttime Electrodiagnostic study December 31 2016 showed no large fiber peripheral neuropathy  UPDATE April 19 2017: Skin biopsy did confirm small fiber neuropathy in March 2018  I reviewed  extensive laboratory evaluations in March 2018  EMG nerve conduction study in March 2018 was within normal limits.  Has tried Requip, clonazepam, did not help her symptoms, make her more dizzy, tired at nighttime  Today 07/13/17   Krista Carlson is an 83 year old female with a history of small fiber neuropathy. She returns today for follow-up. She reports that she is having burning in the feet throughout the day and at bedtime. She reports it tends to be worse at bedtime. She states that is also worse when she is sitting. She denies any significant changes with her gait or balance. Denies any falls. She does participate in yoga. She states that she experienced  the most burning in the feet. In the past she has tried Lyrica and gabapentin although she reports it has been several years ago and she is willing to try them again. She returns today for an evaluation.   REVIEW OF SYSTEMS: Out of a complete 14 system review of symptoms, the patient complains only of the following symptoms, and all other reviewed systems are negative.  Restless leg, frequency of urination  ALLERGIES: Allergies  Allergen Reactions  . Nickel     Redness and discomfort ; contact dermatitis skin turns red    HOME MEDICATIONS: Outpatient Medications Prior to Visit  Medication Sig Dispense Refill  . amLODipine (NORVASC) 5 MG tablet Take 5 mg by mouth every morning.    . Calcium Carbonate-Vitamin D (CALCIUM + D PO) Take 600 mg by mouth 2 (two) times daily.     . cycloSPORINE (RESTASIS) 0.05 % ophthalmic emulsion Place 1 drop into both eyes 2 (two) times daily.     Marland Kitchen  Glucosamine-Chondroitin (COSAMIN DS PO) Take 1 tablet by mouth 2 (two) times daily.      Marland Kitchen latanoprost (XALATAN) 0.005 % ophthalmic solution Place 1 drop into both eyes at bedtime.    Marland Kitchen levothyroxine (SYNTHROID, LEVOTHROID) 112 MCG tablet Take 56-112 mcg by mouth as directed.    . multivitamin (THERAGRAN) per tablet Take 1 tablet by mouth daily.      .  rosuvastatin (CRESTOR) 20 MG tablet Take 5 mg by mouth every Monday, Wednesday, and Friday.     . traZODone (DESYREL) 100 MG tablet TAKE ONE TABLET AT BEDTIME. (Patient taking differently: TAKE 150 MG BY MOUTH  AT BEDTIME.) 30 tablet 0  . Eflornithine HCl (VANIQA) 13.9 % cream Apply 1 application topically 2 (two) times daily.     . clonazePAM (KLONOPIN) 0.5 MG tablet Take 1 tablet (0.5 mg total) by mouth at bedtime. 30 tablet 2  . rOPINIRole (REQUIP) 0.5 MG tablet Take 2 tablets (1 mg total) by mouth at bedtime. 60 tablet 11   No facility-administered medications prior to visit.     PAST MEDICAL HISTORY: Past Medical History:  Diagnosis Date  . Anemia   . Aortic heart murmur   . Arthritis   . Celiac disease    Dr Wynetta Emery  . Depression   . Diverticulitis of colon   . Glaucoma   . Glaucoma 2012  . Heart murmur   . History of colonic polyps    3 mm, Dr Wynetta Emery  . Hypertension   . Hypothyroidism   . Kidney stone    stone in left kidney now  . Osteopenia   . Peripheral neuropathy   . Spinal stenosis     PAST SURGICAL HISTORY: Past Surgical History:  Procedure Laterality Date  . APPENDECTOMY  1966   @ tubal  . BREAST CYST ASPIRATION Left 1983  . CATARACT EXTRACTION Bilateral yrs ago  . COLON BIOPSY  1998   Celiac Sprue; Dr Wynetta Emery  . COLONOSCOPY  2010   Tics& polyp  . COLONOSCOPY WITH PROPOFOL N/A 06/25/2014   Procedure: COLONOSCOPY WITH PROPOFOL;  Surgeon: Garlan Fair, MD;  Location: [facility];  Service: Endoscopy;  Laterality: N/A;  . ESOPHAGOGASTRODUODENOSCOPY (EGD) WITH PROPOFOL N/A 06/25/2014   Procedure: ESOPHAGOGASTRODUODENOSCOPY (EGD) WITH PROPOFOL;  Surgeon: Garlan Fair, MD;  Location: [facility];  Service: Endoscopy;  Laterality: N/A;  . HEMORRHOID SURGERY    . HERNIA REPAIR  1943   Right Side  . INGUINAL HERNIA REPAIR  11/16/2011   Procedure: HERNIA REPAIR INGUINAL ADULT;  Surgeon: Edward Jolly, MD;  Location: [facility];  Service: General;   Laterality: Left;  with mesh  . KNEE ARTHROSCOPY  2007   Left  . NOSE SURGERY  2007   Fractured Nose Reset  . orif broken ankle Left 2013  . POLYPECTOMY  2010   due 2015, 3 mm  . TONSILLECTOMY AND ADENOIDECTOMY  1941  . TUBAL LIGATION  1966    FAMILY HISTORY: Family History  Problem Relation Age of Onset  . Depression Mother        manic  . Heart attack Mother 78  . COPD Mother        heavy smoker  . Coronary artery disease Maternal Uncle        X 6  . Cancer Maternal Uncle        intestinal  . Diabetes Maternal Uncle        X2  . Breast cancer Paternal Aunt   .  Depression Unknown        strong maternal hx with mania  . Diverticulitis Unknown        M uncle & M aunt  . Colon cancer Unknown        M aunt & M uncle  . Diabetes Maternal Aunt   . Stroke Neg Hx   . Asthma Neg Hx     SOCIAL HISTORY: Social History   Social History  . Marital status: Widowed    Spouse name: N/A  . Number of children: 4  . Years of education: Bachelors +   Occupational History  . Retired    Social History Main Topics  . Smoking status: Former Smoker    Packs/day: 0.25    Years: 25.00    Types: Cigarettes    Quit date: 11/01/1984  . Smokeless tobacco: Never Used  . Alcohol use 0.0 oz/week     Comment: whiskey once month  . Drug use: No  . Sexual activity: No   Other Topics Concern  . Not on file   Social History Narrative   Lives at home alone.   Right-handed.   1 coke per day.         PHYSICAL EXAM  Vitals:   07/13/17 1413  BP: 115/68  Pulse: (!) 59  Weight: 132 lb (59.9 kg)   Body mass index is 25.78 kg/m.  Generalized: Well developed, in no acute distress   Neurological examination  Mentation: Alert oriented to time, place, history taking. Follows all commands speech and language fluent Cranial nerve II-XII: Pupils were equal round reactive to light. Extraocular movements were full, visual field were full on confrontational test. Facial sensation and  strength were normal. Uvula tongue midline. Head turning and shoulder shrug  were normal and symmetric. Motor: The motor testing reveals 5 over 5 strength of all 4 extremities. Good symmetric motor tone is noted throughout.  Sensory: Sensory testing is intact to soft touch on all 4 extremities. No evidence of extinction is noted.  Coordination: Cerebellar testing reveals good finger-nose-finger and heel-to-shin bilaterally.  Gait and station: Gait is normal. Tandem gait isSlightly unsteady. Romberg is negative. No drift is seen.  Reflexes: Deep tendon reflexes are symmetric and normal bilaterally.   DIAGNOSTIC DATA (LABS, IMAGING, TESTING) - I reviewed patient records, labs, notes, testing and imaging myself where available.  Lab Results  Component Value Date   WBC 8.6 11/24/2012   HGB 13.7 11/24/2012   HCT 39.4 11/24/2012   MCV 88.7 11/24/2012   PLT 235 11/24/2012      Component Value Date/Time   NA 138 12/05/2012 1148   K 4.0 12/05/2012 1148   CL 102 12/05/2012 1148   CO2 29 12/05/2012 1148   GLUCOSE 98 12/05/2012 1148   BUN 13 12/05/2012 1148   CREATININE 0.7 12/05/2012 1148   CALCIUM 9.3 12/05/2012 1148   PROT 6.6 04/19/2017 1148   ALBUMIN 4.0 11/24/2012 2047   AST 15 11/24/2012 2047   ALT 15 11/24/2012 2047   ALKPHOS 52 11/24/2012 2047   BILITOT 0.6 11/24/2012 2047   GFRNONAA 86 (L) 11/24/2012 2047   GFRAA >90 11/24/2012 2047   Lab Results  Component Value Date   CHOL 182 12/05/2012   HDL 63.90 12/05/2012   LDLCALC 93 12/05/2012   LDLDIRECT 165.2 09/11/2009   TRIG 125.0 12/05/2012   CHOLHDL 3 12/05/2012   Lab Results  Component Value Date   HGBA1C 5.2 04/19/2017   Lab Results  Component Value Date  WERXVQMG86 545 02/21/2014   Lab Results  Component Value Date   TSH 1.27 02/21/2014      ASSESSMENT AND PLAN 83 y.o. year old female  has a past medical history of Anemia; Aortic heart murmur; Arthritis; Celiac disease; Depression; Diverticulitis of  colon; Glaucoma; Glaucoma (2012); Heart murmur; History of colonic polyps; Hypertension; Hypothyroidism; Kidney stone; Osteopenia; Peripheral neuropathy; and Spinal stenosis. here with:  1. Small fiber neuropathy  The patient reports that she is having discomfort in the feet during the day and at bedtime. I suggested we could try Cymbalta to see if it offers her any benefit however she does not want to try this medication. She is willing to retry gabapentin. We will start low-dose at 100 mg 3 times a day. I did advise patient that she can start by taking 100 mg at bedtime for several days and if tolerating she'll then increase to 100 mg twice a day for several days and if she tolerates that she can increase to 100 mg 3 times a day. She is amenable to this plan. I reviewed side effects of gabapentin with her and provided her with a handout. She will follow-up in 4 months or sooner if needed   Ward Givens, MSN, NP-C 07/13/2017, 2:24 PM [facility] [address] [place], [place] [address] [phone]

## 2017-07-13 NOTE — Patient Instructions (Signed)
Your Plan:  Start gabapentin 100 mg three times a day If your symptoms worsen or you develop new symptoms please let us know.    Thank you for coming to see [place] at [facility]. I hope we have been able to provide you high quality care today.  You may receive a patient satisfaction survey over the next few weeks. We would appreciate your feedback and comments so that we may continue to improve ourselves and the health of our patients.  Gabapentin capsules or tablets What is this medicine? GABAPENTIN (GA ba pen tin) is used to control partial seizures in adults with epilepsy. It is also used to treat certain types of nerve pain. This medicine may be used for other purposes; ask your health care provider or pharmacist if you have questions. COMMON BRAND NAME(S): Active-PAC with Gabapentin, Gabarone, Neurontin What should I tell my health care provider before I take this medicine? They need to know if you have any of these conditions: -kidney disease -suicidal thoughts, plans, or attempt; a previous suicide attempt by you or a family member -an unusual or allergic reaction to gabapentin, other medicines, foods, dyes, or preservatives -pregnant or trying to get pregnant -breast-feeding How should I use this medicine? Take this medicine by mouth with a glass of water. Follow the directions on the prescription label. You can take it with or without food. If it upsets your stomach, take it with food.Take your medicine at regular intervals. Do not take it more often than directed. Do not stop taking except on your doctor's advice. If you are directed to break the 600 or 800 mg tablets in half as part of your dose, the extra half tablet should be used for the next dose. If you have not used the extra half tablet within 28 days, it should be thrown away. A special MedGuide will be given to you by the pharmacist with each prescription and refill. Be sure to read this information  carefully each time. Talk to your pediatrician regarding the use of this medicine in children. Special care may be needed. Overdosage: If you think you have taken too much of this medicine contact a poison control center or emergency room at once. NOTE: This medicine is only for you. Do not share this medicine with others. What if I miss a dose? If you miss a dose, take it as soon as you can. If it is almost time for your next dose, take only that dose. Do not take double or extra doses. What may interact with this medicine? Do not take this medicine with any of the following medications: -other gabapentin products This medicine may also interact with the following medications: -alcohol -antacids -antihistamines for allergy, cough and cold -certain medicines for anxiety or sleep -certain medicines for depression or psychotic disturbances -homatropine; hydrocodone -naproxen -narcotic medicines (opiates) for pain -phenothiazines like chlorpromazine, mesoridazine, prochlorperazine, thioridazine This list may not describe all possible interactions. Give your health care provider a list of all the medicines, herbs, non-prescription drugs, or dietary supplements you use. Also tell them if you smoke, drink alcohol, or use illegal drugs. Some items may interact with your medicine. What should I watch for while using this medicine? Visit your doctor or health care professional for regular checks on your progress. You may want to keep a record at home of how you feel your condition is responding to treatment. You may want to share this information with your doctor or health care professional at  each visit. You should contact your doctor or health care professional if your seizures get worse or if you have any new types of seizures. Do not stop taking this medicine or any of your seizure medicines unless instructed by your doctor or health care professional. Stopping your medicine suddenly can increase  your seizures or their severity. Wear a medical identification bracelet or chain if you are taking this medicine for seizures, and carry a card that lists all your medications. You may get drowsy, dizzy, or have blurred vision. Do not drive, use machinery, or do anything that needs mental alertness until you know how this medicine affects you. To reduce dizzy or fainting spells, do not sit or stand up quickly, especially if you are an older patient. Alcohol can increase drowsiness and dizziness. Avoid alcoholic drinks. Your mouth may get dry. Chewing sugarless gum or sucking hard candy, and drinking plenty of water will help. The use of this medicine may increase the chance of suicidal thoughts or actions. Pay special attention to how you are responding while on this medicine. Any worsening of mood, or thoughts of suicide or dying should be reported to your health care professional right away. Women who become pregnant while using this medicine may enroll in the [place] Pregnancy Registry by calling [phone]. This registry collects information about the safety of antiepileptic drug use during pregnancy. What side effects may I notice from receiving this medicine? Side effects that you should report to your doctor or health care professional as soon as possible: -allergic reactions like skin rash, itching or hives, swelling of the face, lips, or tongue -worsening of mood, thoughts or actions of suicide or dying Side effects that usually do not require medical attention (report to your doctor or health care professional if they continue or are bothersome): -constipation -difficulty walking or controlling muscle movements -dizziness -nausea -slurred speech -tiredness -tremors -weight gain This list may not describe all possible side effects. Call your doctor for medical advice about side effects. You may report side effects to FDA at 1-800-FDA-1088. Where should I  keep my medicine? Keep out of reach of children. This medicine may cause accidental overdose and death if it taken by other adults, children, or pets. Mix any unused medicine with a substance like cat litter or coffee grounds. Then throw the medicine away in a sealed container like a sealed bag or a coffee can with a lid. Do not use the medicine after the expiration date. Store at room temperature between 15 and 30 degrees C (59 and 86 degrees F). NOTE: This sheet is a summary. It may not cover all possible information. If you have questions about this medicine, talk to your doctor, pharmacist, or health care provider.  2018 Elsevier/Gold Standard (2013-12-14 15:26:50)

## 2017-07-18 NOTE — Progress Notes (Signed)
I have reviewed and agreed above plan. 

## 2017-08-03 ENCOUNTER — Telehealth: Payer: Self-pay | Admitting: *Deleted

## 2017-08-03 NOTE — Telephone Encounter (Signed)
Can you call patient and see how she is taking. I originally wanted her to take it three times a day.

## 2017-08-03 NOTE — Telephone Encounter (Signed)
Received fax from pharmacy and pt relayed to them that you ok'd qid dosing of the gabapentin.  If so # 120caps. Please advise.

## 2017-08-04 NOTE — Telephone Encounter (Signed)
I spoke to pt and she understood that she could increase this (as per visit with MM/NP) so since 100mg  gabapentin po BID did not make any difference she went to 200mg  po TID on 07-21-17, has made some difference.  Just needs new prescription as she is running out of medication.  No SE per pt.

## 2017-08-05 MED ORDER — GABAPENTIN 100 MG PO CAPS: 200.0000 mg | ORAL_CAPSULE | Freq: Three times a day (TID) | ORAL | 5 refills | Status: DC

## 2017-08-05 NOTE — Telephone Encounter (Signed)
Ordered changed to Gabapentin  200 mg three times a day.

## 2017-08-05 NOTE — Telephone Encounter (Signed)
Called patient to update that a new prescription was sent. Patient had concerns about when to take the medication. I advised to take the medication in the morning around breakfast time, mid afternoon, then again before bedtime. She verbalized understanding.

## 2017-08-23 DIAGNOSIS — Z23 Encounter for immunization: Secondary | ICD-10-CM | POA: Diagnosis not present

## 2017-11-29 ENCOUNTER — Encounter: Payer: Self-pay | Admitting: Adult Health

## 2017-11-29 ENCOUNTER — Ambulatory Visit (INDEPENDENT_AMBULATORY_CARE_PROVIDER_SITE_OTHER): Payer: Medicare Other | Admitting: Adult Health

## 2017-11-29 VITALS — BP 115/64 | HR 65

## 2017-11-29 DIAGNOSIS — G629 Polyneuropathy, unspecified: Secondary | ICD-10-CM

## 2017-11-29 MED ORDER — GABAPENTIN 300 MG PO CAPS: 300.0000 mg | ORAL_CAPSULE | Freq: Three times a day (TID) | ORAL | 5 refills | Status: DC

## 2017-11-29 NOTE — Progress Notes (Signed)
PATIENT: Krista Carlson DOB: [birth year]  REASON FOR VISIT: follow up HISTORY FROM: patient  HISTORY OF PRESENT ILLNESS: HISTORY Krista Carlson a 84 years old right-handed female, seen in refer by her primary care doctor Crist Infante for evaluation of peripheral neuropathy, initial evaluation was on November 15 2016.  She had past medical history of hypertension, hypothyroidism, on supplement, 1998 biopsy proven celiac disease, on gluten-free diet, she presented with uncontrolled diarrhea for 6 months, had a history of vitamin B12 deficiency, she is still receiving vitamin B-12 intramuscular injection once a month, hyperlipidemia, kidney stone, left ankle fracture in 2013, status post surgical fixation,   She noticed gradual onset bilateral feet paresthesia around 2005, slowly progressive over the years, now ascending to below bilateral knee level, she described constant numb tingling occasionally achy sensation, especially at nighttime, she could not tell if her heating pad is on and off because of constant burning sensation at her lower extremity. She denies weakness, she denies finger paresthesia, she denies significant low back pain, does has left-sided neck strain,  She also has chronic insomnia, is taking trazodone 150 mg at night p.m., go to sleep around 10 PM, she has never tried neuropathic pain medications, was given a prescription of Lyrica 50 mg twice a day just recently.  Update January 18 2017: I reviewed laboratory evaluations, normal creatinine 0.8, WBC 5.1, hemoglobin 14 point 2, LDL 106, normal TSH, free T4, vitamin B12 738,  She has tried Lyrica 50 mg up to 2 tablets every night without helping her symptoms, complains of alternating hot and cold sensation urge to move her feet at nighttime Electrodiagnostic study December 31 2016 showed no large fiber peripheral neuropathy  UPDATE April 19 2017: Skin biopsy did confirm small fiber neuropathy in March 2018  I reviewed  extensive laboratory evaluations in March 2018  EMG nerve conduction study in March 2018 was within normal limits.  Has tried Requip, clonazepam, did not help her symptoms, make her more dizzy, tired at nighttime  07/13/17   Krista Carlson is an 84 year old female with a history of small fiber neuropathy. She returns today for follow-up. She reports that she is having burning in the feet throughout the day and at bedtime. She reports it tends to be worse at bedtime. She states that is also worse when she is sitting. She denies any significant changes with her gait or balance. Denies any falls. She does participate in yoga. She states that she experienced  the most burning in the feet. In the past she has tried Lyrica and gabapentin although she reports it has been several years ago and she is willing to try them again. She returns today for an evaluation.  Today 11/29/17  Krista Carlson is a 84 year old female with a history of small fiber neuropathy.  She returns today for follow-up.  At the last visit she was started on gabapentin 200 mg 3 times a day.  Reports that she is unsure if this is offered her any benefit.  She continues to have numbness and tingling in the feet.  She states that she used to be able to use a heating pad at night and out offer her some benefit.  She reports now she has burning in the feet at night.  She reports that she does take trazodone so that does help her fall asleep.  She denies any significant changes with her gait or balance.  Denies any falls.  Denies any trouble sleeping at night.  she returns today for an evaluation.   REVIEW OF SYSTEMS: Out of a complete 14 system review of symptoms, the patient complains only of the following symptoms, and all other reviewed systems are negative.  See HPI  ALLERGIES: Allergies  Allergen Reactions  . Nickel     Redness and discomfort ; contact dermatitis skin turns red    HOME MEDICATIONS: Outpatient Medications Prior to Visit    Medication Sig Dispense Refill  . amLODipine (NORVASC) 5 MG tablet Take 5 mg by mouth every morning.    . Calcium Carbonate-Vitamin D (CALCIUM + D PO) Take 600 mg by mouth 2 (two) times daily.     . Cyanocobalamin 1000 MCG/ML KIT Inject as directed. Every 30 days    . cycloSPORINE (RESTASIS) 0.05 % ophthalmic emulsion Place 1 drop into both eyes 2 (two) times daily.     . Eflornithine HCl (VANIQA) 13.9 % cream Apply 1 application topically 2 (two) times daily.     Marland Kitchen gabapentin (NEURONTIN) 100 MG capsule Take 2 capsules (200 mg total) by mouth 3 (three) times daily. 180 capsule 5  . Glucosamine-Chondroitin (COSAMIN DS PO) Take 1 tablet by mouth 2 (two) times daily.      Marland Kitchen latanoprost (XALATAN) 0.005 % ophthalmic solution Place 1 drop into both eyes at bedtime.    Marland Kitchen levothyroxine (SYNTHROID, LEVOTHROID) 112 MCG tablet Take 56-112 mcg by mouth as directed.    . multivitamin (THERAGRAN) per tablet Take 1 tablet by mouth daily.      . Omega-3 Fatty Acids (FISH OIL) 1000 MG CAPS Take 1,000 mg by mouth 2 (two) times daily.    . rosuvastatin (CRESTOR) 20 MG tablet Take 5 mg by mouth every Monday, Wednesday, and Friday.     . traZODone (DESYREL) 100 MG tablet TAKE ONE TABLET AT BEDTIME. (Patient taking differently: TAKE 150 MG BY MOUTH  AT BEDTIME.) 30 tablet 0   No facility-administered medications prior to visit.     PAST MEDICAL HISTORY: Past Medical History:  Diagnosis Date  . Anemia   . Aortic heart murmur   . Arthritis   . Celiac disease    Dr Wynetta Emery  . Depression   . Diverticulitis of colon   . Glaucoma   . Glaucoma 2012  . Heart murmur   . History of colonic polyps    3 mm, Dr Wynetta Emery  . Hypertension   . Hypothyroidism   . Kidney stone    stone in left kidney now  . Osteopenia   . Peripheral neuropathy   . Spinal stenosis     PAST SURGICAL HISTORY: Past Surgical History:  Procedure Laterality Date  . APPENDECTOMY  1966   @ tubal  . BREAST CYST ASPIRATION Left 1983  .  CATARACT EXTRACTION Bilateral yrs ago  . COLON BIOPSY  1998   Celiac Sprue; Dr Wynetta Emery  . COLONOSCOPY  2010   Tics& polyp  . COLONOSCOPY WITH PROPOFOL N/A 06/25/2014   Procedure: COLONOSCOPY WITH PROPOFOL;  Surgeon: Garlan Fair, MD;  Location: [facility];  Service: Endoscopy;  Laterality: N/A;  . ESOPHAGOGASTRODUODENOSCOPY (EGD) WITH PROPOFOL N/A 06/25/2014   Procedure: ESOPHAGOGASTRODUODENOSCOPY (EGD) WITH PROPOFOL;  Surgeon: Garlan Fair, MD;  Location: [facility];  Service: Endoscopy;  Laterality: N/A;  . HEMORRHOID SURGERY    . HERNIA REPAIR  1943   Right Side  . INGUINAL HERNIA REPAIR  11/16/2011   Procedure: HERNIA REPAIR INGUINAL ADULT;  Surgeon: Edward Jolly, MD;  Location: [facility];  Service: General;  Laterality: Left;  with mesh  . KNEE ARTHROSCOPY  2007   Left  . NOSE SURGERY  2007   Fractured Nose Reset  . orif broken ankle Left 2013  . POLYPECTOMY  2010   due 2015, 3 mm  . TONSILLECTOMY AND ADENOIDECTOMY  1941  . TUBAL LIGATION  1966    FAMILY HISTORY: Family History  Problem Relation Age of Onset  . Depression Mother        manic  . Heart attack Mother 21  . COPD Mother        heavy smoker  . Coronary artery disease Maternal Uncle        X 6  . Cancer Maternal Uncle        intestinal  . Diabetes Maternal Uncle        X2  . Breast cancer Paternal Aunt   . Depression Unknown        strong maternal hx with mania  . Diverticulitis Unknown        M uncle & M aunt  . Colon cancer Unknown        M aunt & M uncle  . Diabetes Maternal Aunt   . Stroke Neg Hx   . Asthma Neg Hx     SOCIAL HISTORY: Social History   Socioeconomic History  . Marital status: Widowed    Spouse name: Not on file  . Number of children: 4  . Years of education: Bachelors +  . Highest education level: Not on file  Social Needs  . Financial resource strain: Not on file  . Food insecurity - worry: Not on file  . Food insecurity - inability: Not on file  .  Transportation needs - medical: Not on file  . Transportation needs - non-medical: Not on file  Occupational History  . Occupation: Retired  Tobacco Use  . Smoking status: Former Smoker    Packs/day: 0.25    Years: 25.00    Pack years: 6.25    Types: Cigarettes    Last attempt to quit: 11/01/1984    Years since quitting: 33.0  . Smokeless tobacco: Never Used  Substance and Sexual Activity  . Alcohol use: Yes    Alcohol/week: 0.0 oz    Comment: whiskey once month  . Drug use: No  . Sexual activity: No    Birth control/protection: Abstinence  Other Topics Concern  . Not on file  Social History Narrative   Lives at home alone.   Right-handed.   1 coke per day.      PHYSICAL EXAM  There were no vitals filed for this visit. There is no height or weight on file to calculate BMI.  Generalized: Well developed, in no acute distress   Neurological examination  Mentation: Alert oriented to time, place, history taking. Follows all commands speech and language fluent Cranial nerve II-XII: Pupils were equal round reactive to light. Extraocular movements were full, visual field were full on confrontational test. Facial sensation and strength were normal. Uvula tongue midline. Head turning and shoulder shrug  were normal and symmetric. Motor: The motor testing reveals 5 over 5 strength of all 4 extremities. Good symmetric motor tone is noted throughout.  Sensory: Sensory testing is intact to soft touch on all 4 extremities. No evidence of extinction is noted.  Coordination: Cerebellar testing reveals good finger-nose-finger and heel-to-shin bilaterally.  Gait and station: Gait is normal. Tandem gait is unsteady.  Romberg is negative. No drift is seen.  Reflexes: Deep tendon reflexes are symmetric and normal bilaterally.   DIAGNOSTIC DATA (LABS, IMAGING, TESTING) - I reviewed patient records, labs, notes, testing and imaging myself where available.   ASSESSMENT AND PLAN 84 y.o. year  old female  has a past medical history of Anemia, Aortic heart murmur, Arthritis, Celiac disease, Depression, Diverticulitis of colon, Glaucoma, Glaucoma (2012), Heart murmur, History of colonic polyps, Hypertension, Hypothyroidism, Kidney stone, Osteopenia, Peripheral neuropathy, and Spinal stenosis. here with:  1.  Small fiber neuropathy  The patient will increase gabapentin to 300 mg 3 times a day to see if this offers her any benefit.  She is advised that if her symptoms worsen or she develops new symptoms she should let us know.  We may consider trying compounded cream in the future.  She will follow-up in 6 months or sooner if needed.  I spent 15 minutes with the patient. 50% of this time was spent discussing medication options.       Ward Givens, MSN, NP-C 11/29/2017, 1:46 PM [facility] [address] [place], [place] [address] [phone]

## 2017-11-29 NOTE — Patient Instructions (Signed)
Your Plan:  Increase gabapentin to 300 mg three times a day If your symptoms worsen or you develop new symptoms please let us know.   Thank you for coming to see [place] at [facility]. I hope we have been able to provide you high quality care today.  You may receive a patient satisfaction survey over the next few weeks. We would appreciate your feedback and comments so that we may continue to improve ourselves and the health of our patients.

## 2017-12-01 NOTE — Progress Notes (Signed)
I have reviewed and agreed above plan. 

## 2017-12-28 DIAGNOSIS — R82998 Other abnormal findings in urine: Secondary | ICD-10-CM | POA: Diagnosis not present

## 2017-12-28 DIAGNOSIS — E038 Other specified hypothyroidism: Secondary | ICD-10-CM | POA: Diagnosis not present

## 2017-12-28 DIAGNOSIS — E538 Deficiency of other specified B group vitamins: Secondary | ICD-10-CM | POA: Diagnosis not present

## 2017-12-28 DIAGNOSIS — M859 Disorder of bone density and structure, unspecified: Secondary | ICD-10-CM | POA: Diagnosis not present

## 2017-12-28 DIAGNOSIS — I10 Essential (primary) hypertension: Secondary | ICD-10-CM | POA: Diagnosis not present

## 2018-01-04 DIAGNOSIS — H4089 Other specified glaucoma: Secondary | ICD-10-CM | POA: Diagnosis not present

## 2018-01-04 DIAGNOSIS — Z1389 Encounter for screening for other disorder: Secondary | ICD-10-CM | POA: Diagnosis not present

## 2018-01-04 DIAGNOSIS — M859 Disorder of bone density and structure, unspecified: Secondary | ICD-10-CM | POA: Diagnosis not present

## 2018-01-04 DIAGNOSIS — F3289 Other specified depressive episodes: Secondary | ICD-10-CM | POA: Diagnosis not present

## 2018-01-04 DIAGNOSIS — Z6825 Body mass index (BMI) 25.0-25.9, adult: Secondary | ICD-10-CM | POA: Diagnosis not present

## 2018-01-04 DIAGNOSIS — R011 Cardiac murmur, unspecified: Secondary | ICD-10-CM | POA: Diagnosis not present

## 2018-01-04 DIAGNOSIS — Z1212 Encounter for screening for malignant neoplasm of rectum: Secondary | ICD-10-CM | POA: Diagnosis not present

## 2018-01-04 DIAGNOSIS — Z0000 Encounter for general adult medical examination without abnormal findings: Secondary | ICD-10-CM | POA: Diagnosis not present

## 2018-01-04 DIAGNOSIS — M419 Scoliosis, unspecified: Secondary | ICD-10-CM | POA: Diagnosis not present

## 2018-01-04 DIAGNOSIS — E038 Other specified hypothyroidism: Secondary | ICD-10-CM | POA: Diagnosis not present

## 2018-01-04 DIAGNOSIS — I10 Essential (primary) hypertension: Secondary | ICD-10-CM | POA: Diagnosis not present

## 2018-01-04 DIAGNOSIS — G608 Other hereditary and idiopathic neuropathies: Secondary | ICD-10-CM | POA: Diagnosis not present

## 2018-01-04 DIAGNOSIS — E538 Deficiency of other specified B group vitamins: Secondary | ICD-10-CM | POA: Diagnosis not present

## 2018-01-16 ENCOUNTER — Other Ambulatory Visit: Payer: Self-pay | Admitting: Internal Medicine

## 2018-01-16 DIAGNOSIS — Z1231 Encounter for screening mammogram for malignant neoplasm of breast: Secondary | ICD-10-CM

## 2018-01-26 DIAGNOSIS — H01001 Unspecified blepharitis right upper eyelid: Secondary | ICD-10-CM | POA: Diagnosis not present

## 2018-01-26 DIAGNOSIS — H401132 Primary open-angle glaucoma, bilateral, moderate stage: Secondary | ICD-10-CM | POA: Diagnosis not present

## 2018-01-26 DIAGNOSIS — H01005 Unspecified blepharitis left lower eyelid: Secondary | ICD-10-CM | POA: Diagnosis not present

## 2018-01-26 DIAGNOSIS — H35033 Hypertensive retinopathy, bilateral: Secondary | ICD-10-CM | POA: Diagnosis not present

## 2018-01-26 DIAGNOSIS — H524 Presbyopia: Secondary | ICD-10-CM | POA: Diagnosis not present

## 2018-01-26 DIAGNOSIS — H04123 Dry eye syndrome of bilateral lacrimal glands: Secondary | ICD-10-CM | POA: Diagnosis not present

## 2018-02-08 ENCOUNTER — Ambulatory Visit
Admission: RE | Admit: 2018-02-08 | Discharge: 2018-02-08 | Disposition: A | Discharge status: Home / Self Care | Payer: Medicare Other | Source: Ambulatory Visit | Attending: Internal Medicine | Admitting: Internal Medicine

## 2018-02-08 DIAGNOSIS — Z1231 Encounter for screening mammogram for malignant neoplasm of breast: Secondary | ICD-10-CM

## 2018-04-14 ENCOUNTER — Emergency Department (HOSPITAL_COMMUNITY): Payer: Medicare Other

## 2018-04-14 ENCOUNTER — Other Ambulatory Visit: Payer: Self-pay

## 2018-04-14 ENCOUNTER — Encounter (HOSPITAL_COMMUNITY): Payer: Self-pay

## 2018-04-14 ENCOUNTER — Emergency Department (HOSPITAL_COMMUNITY)
Admission: EM | Admit: 2018-04-14 | Discharge: 2018-04-14 | Disposition: A | Discharge status: Home / Self Care | Payer: Medicare Other | Attending: Emergency Medicine | Admitting: Emergency Medicine

## 2018-04-14 DIAGNOSIS — S3992XA Unspecified injury of lower back, initial encounter: Secondary | ICD-10-CM | POA: Diagnosis present

## 2018-04-14 DIAGNOSIS — Y929 Unspecified place or not applicable: Secondary | ICD-10-CM | POA: Diagnosis not present

## 2018-04-14 DIAGNOSIS — M545 Low back pain: Secondary | ICD-10-CM | POA: Diagnosis not present

## 2018-04-14 DIAGNOSIS — E039 Hypothyroidism, unspecified: Secondary | ICD-10-CM | POA: Diagnosis not present

## 2018-04-14 DIAGNOSIS — Y998 Other external cause status: Secondary | ICD-10-CM | POA: Diagnosis not present

## 2018-04-14 DIAGNOSIS — Z87891 Personal history of nicotine dependence: Secondary | ICD-10-CM | POA: Diagnosis not present

## 2018-04-14 DIAGNOSIS — Z79899 Other long term (current) drug therapy: Secondary | ICD-10-CM | POA: Diagnosis not present

## 2018-04-14 DIAGNOSIS — S39012A Strain of muscle, fascia and tendon of lower back, initial encounter: Secondary | ICD-10-CM | POA: Diagnosis not present

## 2018-04-14 DIAGNOSIS — I10 Essential (primary) hypertension: Secondary | ICD-10-CM | POA: Diagnosis not present

## 2018-04-14 DIAGNOSIS — Z8679 Personal history of other diseases of the circulatory system: Secondary | ICD-10-CM | POA: Insufficient documentation

## 2018-04-14 DIAGNOSIS — Y9389 Activity, other specified: Secondary | ICD-10-CM | POA: Diagnosis not present

## 2018-04-14 DIAGNOSIS — M546 Pain in thoracic spine: Secondary | ICD-10-CM | POA: Diagnosis not present

## 2018-04-14 MED ORDER — HYDROCODONE-ACETAMINOPHEN 5-325 MG PO TABS: 1.0000 | ORAL_TABLET | Freq: Four times a day (QID) | ORAL | 0 refills | Status: DC | PRN

## 2018-04-14 MED ORDER — IBUPROFEN 200 MG PO TABS
400.0000 mg | ORAL_TABLET | Freq: Once | ORAL | Status: AC
  Administered 2018-04-14: 400 mg via ORAL
  Filled 2018-04-14: qty 2

## 2018-04-14 MED ORDER — HYDROCODONE-ACETAMINOPHEN 5-325 MG PO TABS
1.0000 | ORAL_TABLET | Freq: Once | ORAL | Status: DC
  Filled 2018-04-14: qty 1

## 2018-04-14 NOTE — ED Notes (Signed)
Pt unable to give urine sample
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
## 2018-04-14 NOTE — ED Notes (Signed)
Patient transported to X-ray 
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
## 2018-04-15 ENCOUNTER — Encounter (HOSPITAL_COMMUNITY): Payer: Self-pay | Admitting: Emergency Medicine
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
## 2018-05-01 DIAGNOSIS — R05 Cough: Secondary | ICD-10-CM | POA: Diagnosis not present

## 2018-05-01 DIAGNOSIS — H6122 Impacted cerumen, left ear: Secondary | ICD-10-CM | POA: Diagnosis not present

## 2018-05-01 DIAGNOSIS — J069 Acute upper respiratory infection, unspecified: Secondary | ICD-10-CM | POA: Diagnosis not present

## 2018-05-31 ENCOUNTER — Ambulatory Visit: Payer: Medicare Other | Admitting: Adult Health

## 2018-05-31 ENCOUNTER — Other Ambulatory Visit: Payer: Self-pay | Admitting: Adult Health

## 2018-07-19 ENCOUNTER — Encounter: Payer: Self-pay | Admitting: Adult Health

## 2018-07-19 ENCOUNTER — Ambulatory Visit (INDEPENDENT_AMBULATORY_CARE_PROVIDER_SITE_OTHER): Payer: Medicare Other | Admitting: Adult Health

## 2018-07-19 VITALS — BP 103/63 | HR 64 | Ht 60.0 in | Wt 130.0 lb

## 2018-07-19 DIAGNOSIS — G629 Polyneuropathy, unspecified: Secondary | ICD-10-CM | POA: Diagnosis not present

## 2018-07-19 MED ORDER — GABAPENTIN 400 MG PO CAPS: 400.0000 mg | ORAL_CAPSULE | Freq: Three times a day (TID) | ORAL | 5 refills | Status: DC

## 2018-07-19 NOTE — Progress Notes (Signed)
PATIENT: Krista Carlson DOB: 21-Oct-1932  REASON FOR VISIT: follow up HISTORY FROM: patient  HISTORY OF PRESENT ILLNESS:  HISTORY Krista Carlson a 84 years old right-handed female, seen in refer by her primary care doctor Krista Carlson for evaluation of peripheral neuropathy, initial evaluation was on November 15 2016.  She had past medical history of hypertension, hypothyroidism, on supplement, 1998 biopsy proven celiac disease, on gluten-free diet, she presented with uncontrolled diarrhea for 6 months, had a history of vitamin B12 deficiency, she is still receiving vitamin B-12 intramuscular injection once a month, hyperlipidemia, kidney stone, left ankle fracture in 2013, status post surgical fixation,   She noticed gradual onset bilateral feet paresthesia around 2005, slowly progressive over the years, now ascending to below bilateral knee level, she described constant numb tingling occasionally achy sensation, especially at nighttime, she could not tell if her heating pad is on and off because of constant burning sensation at her lower extremity. She denies weakness, she denies finger paresthesia, she denies significant low back pain, does has left-sided neck strain,  She also has chronic insomnia, is taking trazodone 150 mg at night p.m., go to sleep around 10 PM, she has never tried neuropathic pain medications, was given a prescription of Lyrica 50 mg twice a day just recently.  Update January 18 2017: I reviewed laboratory evaluations, normal creatinine 0.8, WBC 5.1, hemoglobin 14 point 2, LDL 106, normal TSH, free T4, vitamin B12 738,  She has tried Lyrica 50 mg up to 2 tablets every night without helping her symptoms, complains of alternating hot and cold sensation urge to move her feet at nighttime Electrodiagnostic study December 31 2016 showed no large fiber peripheral neuropathy  UPDATE April 19 2017: Skin biopsy did confirm small fiber neuropathy in March 2018  I reviewed  extensive laboratory evaluations in March 2018  EMG nerve conduction study in March 2018 was within normal limits.  Has tried Requip, clonazepam, did not help her symptoms, make her more dizzy, tired at nighttime  07/13/17  [name] is an 84 year old female with a history of small fiber neuropathy. She returns today for follow-up. She reports that she is having burning in the feet throughout the day andatbedtime. She reports it tends to be worse at bedtime. She states that is also worse when she is sitting. She denies any significant changes with her gait or balance. Denies any falls. She does participate in yoga. She states that she experienced the most burning in the feet. In the past she has tried Lyrica and gabapentin although she reports it has been several years ago and she is willing to try them again. She returns today for an evaluation.  Update 11/29/17  [name] is a 84 year old female with a history of small fiber neuropathy.  She returns today for follow-up.  At the last visit she was started on gabapentin 200 mg 3 times a day.  Reports that she is unsure if this is offered her any benefit.  She continues to have numbness and tingling in the feet.  She states that she used to be able to use a heating pad at night and out offer her some benefit.  She reports now she has burning in the feet at night.  She reports that she does take trazodone so that does help her fall asleep.  She denies any significant changes with her gait or balance.  Denies any falls.  Denies any trouble sleeping at night. she returns today  for an evaluation.  Today 07/19/18:  [name] is a 84 year old female with a history of small fiber neuropathy.  She returns today for follow-up.  She has tried Lyrica in the past.  At the last visit gabapentin was increased to 300 mg 3 times a day however she does not feel that this is beneficial.  She continues to have numbness and burning sensation in the feet and lower  legs.  She has never tried prescription strength cream.  She denies any changes with her gait or balance.  She continues to use trazodone to help her sleep.  She returns today for evaluation.   REVIEW OF SYSTEMS: Out of a complete 14 system review of symptoms, the patient complains only of the following symptoms, and all other reviewed systems are negative.  See HPI  ALLERGIES: Allergies  Allergen Reactions  . Nickel     Redness and discomfort ; contact dermatitis skin turns red  . Other     Wheat, barley, gluten rye- celiac disease    HOME MEDICATIONS: Outpatient Medications Prior to Visit  Medication Sig Dispense Refill  . alendronate (FOSAMAX) 70 MG tablet Take 1 tablet by mouth once a week. Wednesday    . amLODipine (NORVASC) 5 MG tablet Take 5 mg by mouth every morning.    . Calcium Carbonate-Vitamin D (CALCIUM + D PO) Take 600 mg by mouth 2 (two) times daily.     . Cyanocobalamin 1000 MCG/ML KIT Inject as directed. Every 30 days    . cycloSPORINE (RESTASIS) 0.05 % ophthalmic emulsion Place 1 drop into both eyes 2 (two) times daily.     Marland Kitchen gabapentin (NEURONTIN) 300 MG capsule TAKE (1) CAPSULE THREE TIMES DAILY. 90 capsule 5  . Glucosamine-Chondroitin (COSAMIN DS PO) Take 1 tablet by mouth 2 (two) times daily.      Marland Kitchen latanoprost (XALATAN) 0.005 % ophthalmic solution Place 1 drop into both eyes at bedtime.    Marland Kitchen levothyroxine (SYNTHROID, LEVOTHROID) 112 MCG tablet Take 56-112 mcg by mouth as directed.    . multivitamin (THERAGRAN) per tablet Take 1 tablet by mouth daily.      . Omega-3 Fatty Acids (FISH OIL) 1000 MG CAPS Take 1,000 mg by mouth 2 (two) times daily.    . rosuvastatin (CRESTOR) 20 MG tablet Take 10 mg by mouth daily.     . traZODone (DESYREL) 100 MG tablet TAKE ONE TABLET AT BEDTIME. (Patient taking differently: TAKE 150 MG BY MOUTH  AT BEDTIME.) 30 tablet 0  . HYDROcodone-acetaminophen (NORCO/VICODIN) 5-325 MG tablet Take 1-2 tablets by mouth every 6 (six) hours as  needed for severe pain. (Patient not taking: Reported on 07/19/2018) 10 tablet 0   No facility-administered medications prior to visit.     PAST MEDICAL HISTORY: Past Medical History:  Diagnosis Date  . Anemia   . Aortic heart murmur   . Arthritis   . Celiac disease    Dr Wynetta Emery  . Depression   . Diverticulitis of colon   . Glaucoma   . Glaucoma 2012  . Heart murmur   . History of colonic polyps    3 mm, Dr Wynetta Emery  . Hypertension   . Hypothyroidism   . Kidney stone    stone in left kidney now  . Osteopenia   . Peripheral neuropathy   . Spinal stenosis     PAST SURGICAL HISTORY: Past Surgical History:  Procedure Laterality Date  . APPENDECTOMY  1966   @ tubal  . BREAST CYST  ASPIRATION Left 1983  . CATARACT EXTRACTION Bilateral yrs ago  . COLON BIOPSY  1998   Celiac Sprue; Dr Wynetta Emery  . COLONOSCOPY  2010   Tics& polyp  . COLONOSCOPY WITH PROPOFOL N/A 06/25/2014   Procedure: COLONOSCOPY WITH PROPOFOL;  Surgeon: Garlan Fair, MD;  Location: [facility];  Service: Endoscopy;  Laterality: N/A;  . ESOPHAGOGASTRODUODENOSCOPY (EGD) WITH PROPOFOL N/A 06/25/2014   Procedure: ESOPHAGOGASTRODUODENOSCOPY (EGD) WITH PROPOFOL;  Surgeon: Garlan Fair, MD;  Location: [facility];  Service: Endoscopy;  Laterality: N/A;  . HEMORRHOID SURGERY    . HERNIA REPAIR  1943   Right Side  . INGUINAL HERNIA REPAIR  11/16/2011   Procedure: HERNIA REPAIR INGUINAL ADULT;  Surgeon: Edward Jolly, MD;  Location: [facility];  Service: General;  Laterality: Left;  with mesh  . KNEE ARTHROSCOPY  2007   Left  . NOSE SURGERY  2007   Fractured Nose Reset  . orif broken ankle Left 2013  . POLYPECTOMY  2010   due 2015, 3 mm  . TONSILLECTOMY AND ADENOIDECTOMY  1941  . TUBAL LIGATION  1966    FAMILY HISTORY: Family History  Problem Relation Age of Onset  . Depression Mother        manic  . Heart attack Mother 24  . COPD Mother        heavy smoker  . Coronary artery disease Maternal  Uncle        X 6  . Cancer Maternal Uncle        intestinal  . Diabetes Maternal Uncle        X2  . Breast cancer Paternal Aunt 69  . Depression Unknown        strong maternal hx with mania  . Diverticulitis Unknown        M uncle & M aunt  . Colon cancer Unknown        M aunt & M uncle  . Diabetes Maternal Aunt   . Stroke Neg Hx   . Asthma Neg Hx     SOCIAL HISTORY: Social History   Socioeconomic History  . Marital status: Widowed    Spouse name: Not on file  . Number of children: 4  . Years of education: Bachelors +  . Highest education level: Not on file  Occupational History  . Occupation: Retired  Scientific laboratory technician  . Financial resource strain: Not on file  . Food insecurity:    Worry: Not on file    Inability: Not on file  . Transportation needs:    Medical: Not on file    Non-medical: Not on file  Tobacco Use  . Smoking status: Former Smoker    Packs/day: 0.25    Years: 25.00    Pack years: 6.25    Types: Cigarettes    Last attempt to quit: 11/01/1984    Years since quitting: 33.7  . Smokeless tobacco: Never Used  Substance and Sexual Activity  . Alcohol use: Yes    Comment: whiskey once month  . Drug use: No  . Sexual activity: Never    Birth control/protection: Abstinence  Lifestyle  . Physical activity:    Days per week: Not on file    Minutes per session: Not on file  . Stress: Not on file  Relationships  . Social connections:    Talks on phone: Not on file    Gets together: Not on file    Attends religious service: Not on file  Active member of club or organization: Not on file    Attends meetings of clubs or organizations: Not on file    Relationship status: Not on file  . Intimate partner violence:    Fear of current or ex partner: Not on file    Emotionally abused: Not on file    Physically abused: Not on file    Forced sexual activity: Not on file  Other Topics Concern  . Not on file  Social History Narrative   Lives at home alone.     Right-handed.   1 coke per day.      PHYSICAL EXAM  Vitals:   07/19/18 1241  BP: 103/63  Pulse: 64  Weight: 130 lb (59 kg)  Height: 5' (1.524 m)   Body mass index is 25.39 kg/m.  Generalized: Well developed, in no acute distress   Neurological examination  Mentation: Alert oriented to time, place, history taking. Follows all commands speech and language fluent Cranial nerve II-XII: Pupils were equal round reactive to light. Extraocular movements were full, visual field were full on confrontational test. Facial sensation and strength were normal. Uvula tongue midline. Head turning and shoulder shrug  were normal and symmetric. Motor: The motor testing reveals 5 over 5 strength of all 4 extremities. Good symmetric motor tone is noted throughout.  Sensory: Sensory testing is intact to soft touch on all 4 extremities. No evidence of extinction is noted.  Coordination: Cerebellar testing reveals good finger-nose-finger and heel-to-shin bilaterally.  Gait and station: Gait is normal.  Reflexes: Deep tendon reflexes are symmetric and normal bilaterally.   DIAGNOSTIC DATA (LABS, IMAGING, TESTING) - I reviewed patient records, labs, notes, testing and imaging myself where available.  Lab Results  Component Value Date   WBC 8.6 11/24/2012   HGB 13.7 11/24/2012   HCT 39.4 11/24/2012   MCV 88.7 11/24/2012   PLT 235 11/24/2012      Component Value Date/Time   NA 138 12/05/2012 1148   K 4.0 12/05/2012 1148   CL 102 12/05/2012 1148   CO2 29 12/05/2012 1148   GLUCOSE 98 12/05/2012 1148   BUN 13 12/05/2012 1148   CREATININE 0.7 12/05/2012 1148   CALCIUM 9.3 12/05/2012 1148   PROT 6.6 04/19/2017 1148   ALBUMIN 4.0 11/24/2012 2047   AST 15 11/24/2012 2047   ALT 15 11/24/2012 2047   ALKPHOS 52 11/24/2012 2047   BILITOT 0.6 11/24/2012 2047   GFRNONAA 86 (L) 11/24/2012 2047   GFRAA >90 11/24/2012 2047   Lab Results  Component Value Date   CHOL 182 12/05/2012   HDL 63.90  12/05/2012   LDLCALC 93 12/05/2012   LDLDIRECT 165.2 09/11/2009   TRIG 125.0 12/05/2012   CHOLHDL 3 12/05/2012   Lab Results  Component Value Date   HGBA1C 5.2 04/19/2017   Lab Results  Component Value Date   GYFVCBSW96 759 02/21/2014   Lab Results  Component Value Date   TSH 1.27 02/21/2014      ASSESSMENT AND PLAN 85 y.o. year old female  has a past medical history of Anemia, Aortic heart murmur, Arthritis, Celiac disease, Depression, Diverticulitis of colon, Glaucoma, Glaucoma (2012), Heart murmur, History of colonic polyps, Hypertension, Hypothyroidism, Kidney stone, Osteopenia, Peripheral neuropathy, and Spinal stenosis. here with:  1.  Small fiber neuropathy  I had a long discussion with the patient and her daughter explaining treatment options for neuropathy.  They were discouraged as the patient has not had relief in 3 years with her symptoms.  We will increase gabapentin to 400 mg 3 times a day as well as add on compounded cream.  If this is not beneficial she is advised to call her office and let me know.  In the future we may consider Cymbalta.  She is advised that if her symptoms worsen or she develops new symptoms she should let us know.  She will follow-up in 6 months or sooner if needed.   I spent 15 minutes with the patient. 50% of this time was spent reviewing her plan of care   Ward Givens, MSN, NP-C 07/19/2018, 1:01 PM [facility] [address], [place], [place] [address] [phone]

## 2018-07-19 NOTE — Patient Instructions (Signed)
Your Plan:  Increase Gabapentin 400 mg three times a day Compound cream ordered. If your symptoms worsen or you develop new symptoms please let us know.   Thank you for coming to see [place] at [facility]. I hope we have been able to provide you high quality care today.  You may receive a patient satisfaction survey over the next few weeks. We would appreciate your feedback and comments so that we may continue to improve ourselves and the health of our patients.

## 2018-07-19 NOTE — Progress Notes (Signed)
Rx for compounded cream faxed to [facility].

## 2018-08-01 DIAGNOSIS — Z23 Encounter for immunization: Secondary | ICD-10-CM | POA: Diagnosis not present

## 2018-08-10 DIAGNOSIS — H01002 Unspecified blepharitis right lower eyelid: Secondary | ICD-10-CM | POA: Diagnosis not present

## 2018-08-10 DIAGNOSIS — H401132 Primary open-angle glaucoma, bilateral, moderate stage: Secondary | ICD-10-CM | POA: Diagnosis not present

## 2018-08-10 DIAGNOSIS — H01001 Unspecified blepharitis right upper eyelid: Secondary | ICD-10-CM | POA: Diagnosis not present

## 2018-08-10 DIAGNOSIS — H01004 Unspecified blepharitis left upper eyelid: Secondary | ICD-10-CM | POA: Diagnosis not present

## 2018-08-23 ENCOUNTER — Telehealth: Payer: Self-pay | Admitting: Adult Health

## 2018-08-23 MED ORDER — GABAPENTIN 600 MG PO TABS: 600.0000 mg | ORAL_TABLET | Freq: Three times a day (TID) | ORAL | 5 refills | Status: DC

## 2018-08-23 NOTE — Telephone Encounter (Signed)
I called the patient.  She reports that she still having discomfort in the feet.  She does not think the increase in gabapentin has offered her any benefit.  She did not receive the compound cream.  She would like to increase the gabapentin.  She will increase to 600 mg 3 times a day.  She reports that so far she has tolerated gabapentin well.  I have reviewed potential side effects with the patient.  She will call back if this is not beneficial.

## 2018-08-23 NOTE — Telephone Encounter (Signed)
Spoke with Krista Carlson and she stated she hasn't noticed any improvement with her numbness in her legs/feet since her dose was increased from 300 mg to 400 mg three times a day on 07/19/18. Please advise.

## 2018-08-23 NOTE — Telephone Encounter (Signed)
Pt requesting a call stating that gabapentin (NEURONTIN) 400 MG capsule has not helped with her numbness in her legs/feet please advise

## 2018-09-22 NOTE — Telephone Encounter (Signed)
Pt requesting a call stating that medication gabapentin (NEURONTIN) 600 MG tablet has not helped with her neuropathy pain. Requesting a call to discuss a different medication.

## 2018-09-25 MED ORDER — DULOXETINE HCL 30 MG PO CPEP: 30.0000 mg | ORAL_CAPSULE | Freq: Every day | ORAL | 3 refills | Status: DC

## 2018-09-25 NOTE — Telephone Encounter (Signed)
I called the patient.  She reports that despite increasing gabapentin to 600mg  3 times a day she still has discomfort in the feet related to her neuropathy.  In the past she is has tried Lyrica and nortriptyline.  She is also tried compounded cream in the past.  We discussed Cymbalta.  She would like to give this a try.  I will start her at low-dose 30 mg daily.  I did advise that taking Cymbalta in addition to trazodone can increase her risk of developing serotonin syndrome.  I reviewed symptoms related to serotonin syndrome and advised that she should go to the emergency room should she develop any of the symptoms.  She voiced understanding.

## 2018-09-25 NOTE — Addendum Note (Signed)
Addended by: Trudie Buckler on: 09/25/2018 03:20 PM   Modules accepted: Orders

## 2018-11-29 ENCOUNTER — Telehealth: Payer: Self-pay | Admitting: Adult Health

## 2018-11-29 NOTE — Telephone Encounter (Signed)
Pt states that she does not feel any better even after the medication change she had done in Dec. She would like to know what the next step should be. Please advise.

## 2018-11-30 NOTE — Telephone Encounter (Signed)
We could increase Cymbalta to 30 mg BID if she is tolerating it well.

## 2018-12-01 NOTE — Telephone Encounter (Signed)
LVM informing the patient that NP advised she can increase cymbalta and take it twice a day. Advised she try for 1-2 weeks with current prescription and let us know if that is helpful. Left office number, advised we close at noon today.

## 2018-12-25 ENCOUNTER — Other Ambulatory Visit: Payer: Self-pay | Admitting: *Deleted

## 2018-12-25 MED ORDER — DULOXETINE HCL 30 MG PO CPEP: 30.0000 mg | ORAL_CAPSULE | Freq: Two times a day (BID) | ORAL | 5 refills | Status: DC

## 2019-01-24 ENCOUNTER — Encounter: Payer: Self-pay | Admitting: Neurology

## 2019-01-24 ENCOUNTER — Ambulatory Visit (INDEPENDENT_AMBULATORY_CARE_PROVIDER_SITE_OTHER): Payer: Medicare Other | Admitting: Neurology

## 2019-01-24 DIAGNOSIS — G6289 Other specified polyneuropathies: Secondary | ICD-10-CM

## 2019-01-24 MED ORDER — DULOXETINE HCL 30 MG PO CPEP: 30.0000 mg | ORAL_CAPSULE | Freq: Two times a day (BID) | ORAL | 4 refills | Status: DC

## 2019-01-24 MED ORDER — GABAPENTIN 600 MG PO TABS: 600.0000 mg | ORAL_TABLET | Freq: Three times a day (TID) | ORAL | 4 refills | Status: DC

## 2019-01-24 NOTE — Progress Notes (Signed)
PATIENT: Krista Carlson DOB: 1932/06/22  REASON FOR VISIT: follow up HISTORY FROM: patient  HISTORY OF PRESENT ILLNESS:  HISTORY Krista Savitt Coxis a 85 years old right-handed female, seen in refer by her primary care doctor Krista Carlson for evaluation of peripheral neuropathy, initial evaluation was on November 15 2016.  She had past medical history of hypertension, hypothyroidism, on supplement, 1998 biopsy proven celiac disease, on gluten-free diet, she presented with uncontrolled diarrhea for 6 months, had a history of vitamin B12 deficiency, she is still receiving vitamin B-12 intramuscular injection once a month, hyperlipidemia, kidney stone, left ankle fracture in 2013, status post surgical fixation,   She noticed gradual onset bilateral feet paresthesia around 2005, slowly progressive over the years, now ascending to below bilateral knee level, she described constant numb tingling occasionally achy sensation, especially at nighttime, she could not tell if her heating pad is on and off because of constant burning sensation at her lower extremity. She denies weakness, she denies finger paresthesia, she denies significant low back pain, does has left-sided neck strain,  She also has chronic insomnia, is taking trazodone 150 mg at night p.m., go to sleep around 10 PM, she has never tried neuropathic pain medications, was given a prescription of Lyrica 50 mg twice a day just recently.  Update January 18 2017: I reviewed laboratory evaluations, normal creatinine 0.8, WBC 5.1, hemoglobin 14 point 2, LDL 106, normal TSH, free T4, vitamin B12 738,  She has tried Lyrica 50 mg up to 2 tablets every night without helping her symptoms, complains of alternating hot and cold sensation urge to move her feet at nighttime Electrodiagnostic study December 31 2016 showed no large fiber peripheral neuropathy  UPDATE April 19 2017: Skin biopsy did confirm small fiber neuropathy in March 2018  I reviewed  extensive laboratory evaluations in March 2018  EMG nerve conduction study in March 2018 was within normal limits.  Has tried Requip, clonazepam, did not help her symptoms, make her more dizzy, tired at nighttime    Virtual Visit via Video on January 24 2019:  I connected with Krista Carlson on 01/24/19 at  by Video and verified that I am speaking with the correct person using two identifiers.   I discussed the limitations, risks, security and privacy concerns of performing an evaluation and management service by Video and the availability of in person appointments. I also discussed with the patient that there may be a patient responsible charge related to this service. The patient expressed understanding and agreed to proceed.   History of Present Illness: She continue complains of bilateral feet paresthesia despite polypharmacy gabapentin 600 mg 3 times a day, Cymbalta 30 mg twice a day.  She is also taking trazodone 150 mg every night to help her sleep better, she continued B12 injection   Observations/Objective: I have reviewed problem lists, medications, allergies.  Laboratory evaluations in June 2018 showed normal ESR CPK C-reactive protein Lyme titer, mineral fixative electro protein pheresis, A1c was 5.2, normal ferritin, negative ANA, copper, no treatable etiology found   Assessment and Plan: Small fiber peripheral neuropathy  Confirmed by skin biopsy  No treatable etiology found by extensive laboratory evaluations in the past,  Symptomatic management only  Refilled her gabapentin 600 mg 3 times a day, Cymbalta 30 mg twice a day, she was not sure current medication was helping her symptoms, I have suggested giving a trial of tapering off Cymbalta,  Follow Up Instructions:  In 6 months  I discussed the assessment and treatment plan with the patient. The patient was provided an opportunity to ask questions and all were answered. The patient agreed with the plan and  demonstrated an understanding of the instructions.   The patient was advised to call back or seek an in-person evaluation if the symptoms worsen or if the condition fails to improve as anticipated.  I provided 11 minutes of non-face-to-face time during this encounter.   Marcial Pacas, MD

## 2019-02-14 DIAGNOSIS — M859 Disorder of bone density and structure, unspecified: Secondary | ICD-10-CM | POA: Diagnosis not present

## 2019-02-14 DIAGNOSIS — I10 Essential (primary) hypertension: Secondary | ICD-10-CM | POA: Diagnosis not present

## 2019-02-14 DIAGNOSIS — R82998 Other abnormal findings in urine: Secondary | ICD-10-CM | POA: Diagnosis not present

## 2019-02-14 DIAGNOSIS — E038 Other specified hypothyroidism: Secondary | ICD-10-CM | POA: Diagnosis not present

## 2019-02-14 DIAGNOSIS — E538 Deficiency of other specified B group vitamins: Secondary | ICD-10-CM | POA: Diagnosis not present

## 2019-02-21 DIAGNOSIS — M419 Scoliosis, unspecified: Secondary | ICD-10-CM | POA: Diagnosis not present

## 2019-02-21 DIAGNOSIS — F329 Major depressive disorder, single episode, unspecified: Secondary | ICD-10-CM | POA: Diagnosis not present

## 2019-02-21 DIAGNOSIS — H6122 Impacted cerumen, left ear: Secondary | ICD-10-CM | POA: Diagnosis not present

## 2019-02-21 DIAGNOSIS — Z0000 Encounter for general adult medical examination without abnormal findings: Secondary | ICD-10-CM | POA: Diagnosis not present

## 2019-02-21 DIAGNOSIS — R011 Cardiac murmur, unspecified: Secondary | ICD-10-CM | POA: Diagnosis not present

## 2019-02-21 DIAGNOSIS — E039 Hypothyroidism, unspecified: Secondary | ICD-10-CM | POA: Diagnosis not present

## 2019-02-21 DIAGNOSIS — R3121 Asymptomatic microscopic hematuria: Secondary | ICD-10-CM | POA: Diagnosis not present

## 2019-02-21 DIAGNOSIS — Z1331 Encounter for screening for depression: Secondary | ICD-10-CM | POA: Diagnosis not present

## 2019-02-21 DIAGNOSIS — M8580 Other specified disorders of bone density and structure, unspecified site: Secondary | ICD-10-CM | POA: Diagnosis not present

## 2019-02-21 DIAGNOSIS — R300 Dysuria: Secondary | ICD-10-CM | POA: Diagnosis not present

## 2019-02-21 DIAGNOSIS — G609 Hereditary and idiopathic neuropathy, unspecified: Secondary | ICD-10-CM | POA: Diagnosis not present

## 2019-05-10 DIAGNOSIS — H35033 Hypertensive retinopathy, bilateral: Secondary | ICD-10-CM | POA: Diagnosis not present

## 2019-05-10 DIAGNOSIS — H01002 Unspecified blepharitis right lower eyelid: Secondary | ICD-10-CM | POA: Diagnosis not present

## 2019-05-10 DIAGNOSIS — H401132 Primary open-angle glaucoma, bilateral, moderate stage: Secondary | ICD-10-CM | POA: Diagnosis not present

## 2019-05-10 DIAGNOSIS — H01004 Unspecified blepharitis left upper eyelid: Secondary | ICD-10-CM | POA: Diagnosis not present

## 2019-05-10 DIAGNOSIS — H01001 Unspecified blepharitis right upper eyelid: Secondary | ICD-10-CM | POA: Diagnosis not present

## 2019-05-21 NOTE — Progress Notes (Signed)
PATIENT: Krista Carlson DOB: [birth year]  REASON FOR VISIT: follow up HISTORY FROM: patient  HISTORY OF PRESENT ILLNESS: Today 05/22/19  HISTORY  HISTORYJoanne M Coxis a 85 years old right-handed female, seen in refer by her primary care doctor Krista Carlson for evaluation of peripheral neuropathy, initial evaluation was on November 15 2016.  She had past medical history of hypertension, hypothyroidism, on supplement, 1998 biopsy proven celiac disease, on gluten-free diet, she presented with uncontrolled diarrhea for 6 months, had a history of vitamin B12 deficiency, she is still receiving vitamin B-12 intramuscular injection once a month, hyperlipidemia, kidney stone, left ankle fracture in 2013, status post surgical fixation,   She noticed gradual onset bilateral feet paresthesia around 2005, slowly progressive over the years, now ascending to below bilateral knee level, she described constant numb tingling occasionally achy sensation, especially at nighttime, she could not tell if her heating pad is on and off because of constant burning sensation at her lower extremity. She denies weakness, she denies finger paresthesia, she denies significant low back pain, does has left-sided neck strain,  She also has chronic insomnia, is taking trazodone 150 mg at night p.m., go to sleep around 10 PM, she has never tried neuropathic pain medications, was given a prescription of Lyrica 50 mg twice a day just recently.  Update January 18 2017: I reviewed laboratory evaluations, normal creatinine 0.8, WBC 5.1, hemoglobin 14 point 2, LDL 106, normal TSH, free T4, vitamin B12 738,  She has tried Lyrica 50 mg up to 2 tablets every night without helping her symptoms, complains of alternating hot and cold sensation urge to move her feet at nighttime Electrodiagnostic study December 31 2016 showed no large fiber peripheral neuropathy  UPDATE April 19 2017: Skin biopsy did confirm small fiber neuropathy in  March 2018  I reviewed extensive laboratory evaluations in March 2018  EMG nerve conduction study in March 2018 was within normal limits.  Has tried Requip, clonazepam, did not help her symptoms, make her more dizzy, tired at nighttime  Update 01/24/2019 Dr. Krista Carlson: She continue complains of bilateral feet paresthesia despite polypharmacy gabapentin 600 mg 3 times a day, Cymbalta 30 mg twice a day.  She is also taking trazodone 150 mg every night to help her sleep better, she continued B12 injection  Update May 22 2019 SS: Small fiber peripheral neuropathy confirmed by skin biopsy, since last visit she has stopped her gabapentin and Cymbalta.  In the past she has tried Lyrica, nortriptyline, compounded cream.  She has not noticed much change since being off the gabapentin and Cymbalta, maybe the pain is slightly worse?  She continues to complain of burning, stinging, coldness, to bilateral lower extremities.  She is able to sleep at night with the aid of trazodone.  The pain is constant throughout the day. She lives alone, drives a car without difficulty.  She does not use assistive devices, she has not had any falls.  REVIEW OF SYSTEMS: Out of a complete 14 system review of symptoms, the patient complains only of the following symptoms, and all other reviewed systems are negative.  Trouble sleeping, neuropathy, paresthesia  ALLERGIES: Allergies  Allergen Reactions  . Nickel     Redness and discomfort ; contact dermatitis skin turns red  . Other     Wheat, barley, gluten rye- celiac disease    HOME MEDICATIONS: Outpatient Medications Prior to Visit  Medication Sig Dispense Refill  . alendronate (FOSAMAX) 70 MG tablet Take 1 tablet  by mouth once a week. Wednesday    . amLODipine (NORVASC) 5 MG tablet Take 5 mg by mouth every morning.    . Calcium Carbonate-Vitamin D (CALCIUM + D PO) Take 600 mg by mouth 2 (two) times daily.     . Cyanocobalamin 1000 MCG/ML KIT Inject as directed.  Every 30 days    . cycloSPORINE (RESTASIS) 0.05 % ophthalmic emulsion Place 1 drop into both eyes 2 (two) times daily.     . Glucosamine-Chondroitin (COSAMIN DS PO) Take 1 tablet by mouth 2 (two) times daily.      Marland Kitchen HYDROcodone-acetaminophen (NORCO/VICODIN) 5-325 MG tablet Take 1-2 tablets by mouth every 6 (six) hours as needed for severe pain. 10 tablet 0  . latanoprost (XALATAN) 0.005 % ophthalmic solution Place 1 drop into both eyes at bedtime.    Marland Kitchen levothyroxine (SYNTHROID, LEVOTHROID) 112 MCG tablet Take 56-112 mcg by mouth as directed.    . multivitamin (THERAGRAN) per tablet Take 1 tablet by mouth daily.      . Omega-3 Fatty Acids (FISH OIL) 1000 MG CAPS Take 1,000 mg by mouth 2 (two) times daily.    . rosuvastatin (CRESTOR) 20 MG tablet Take 10 mg by mouth daily.     . traZODone (DESYREL) 100 MG tablet Take 200 mg by mouth at bedtime.    . DULoxetine (CYMBALTA) 30 MG capsule Take 1 capsule (30 mg total) by mouth 2 (two) times daily. (Patient not taking: Reported on 05/22/2019) 180 capsule 4  . gabapentin (NEURONTIN) 600 MG tablet Take 1 tablet (600 mg total) by mouth 3 (three) times daily. (Patient not taking: Reported on 05/22/2019) 270 tablet 4  . NONFORMULARY OR COMPOUNDED ITEM     . traZODone (DESYREL) 100 MG tablet TAKE ONE TABLET AT BEDTIME. (Patient taking differently: TAKE 150 MG BY MOUTH  AT BEDTIME.) 30 tablet 0   No facility-administered medications prior to visit.     PAST MEDICAL HISTORY: Past Medical History:  Diagnosis Date  . Anemia   . Aortic heart murmur   . Arthritis   . Celiac disease    Dr Wynetta Emery  . Depression   . Diverticulitis of colon   . Glaucoma   . Glaucoma 2012  . Heart murmur   . History of colonic polyps    3 mm, Dr Wynetta Emery  . Hypertension   . Hypothyroidism   . Kidney stone    stone in left kidney now  . Osteopenia   . Peripheral neuropathy   . Spinal stenosis     PAST SURGICAL HISTORY: Past Surgical History:  Procedure Laterality Date   . APPENDECTOMY  1966   @ tubal  . BREAST CYST ASPIRATION Left 1983  . CATARACT EXTRACTION Bilateral yrs ago  . COLON BIOPSY  1998   Celiac Sprue; Dr Wynetta Emery  . COLONOSCOPY  2010   Tics& polyp  . COLONOSCOPY WITH PROPOFOL N/A 06/25/2014   Procedure: COLONOSCOPY WITH PROPOFOL;  Surgeon: Garlan Fair, MD;  Location: [facility];  Service: Endoscopy;  Laterality: N/A;  . ESOPHAGOGASTRODUODENOSCOPY (EGD) WITH PROPOFOL N/A 06/25/2014   Procedure: ESOPHAGOGASTRODUODENOSCOPY (EGD) WITH PROPOFOL;  Surgeon: Garlan Fair, MD;  Location: [facility];  Service: Endoscopy;  Laterality: N/A;  . HEMORRHOID SURGERY    . HERNIA REPAIR  1943   Right Side  . INGUINAL HERNIA REPAIR  11/16/2011   Procedure: HERNIA REPAIR INGUINAL ADULT;  Surgeon: Edward Jolly, MD;  Location: [facility];  Service: General;  Laterality: Left;  with mesh  .  KNEE ARTHROSCOPY  2007   Left  . NOSE SURGERY  2007   Fractured Nose Reset  . orif broken ankle Left 2013  . POLYPECTOMY  2010   due 2015, 3 mm  . TONSILLECTOMY AND ADENOIDECTOMY  1941  . TUBAL LIGATION  1966    FAMILY HISTORY: Family History  Problem Relation Age of Onset  . Depression Mother        manic  . Heart attack Mother 2  . COPD Mother        heavy smoker  . Coronary artery disease Maternal Uncle        X 6  . Cancer Maternal Uncle        intestinal  . Diabetes Maternal Uncle        X2  . Breast cancer Paternal Aunt 21  . Depression Unknown        strong maternal hx with mania  . Diverticulitis Unknown        M uncle & M aunt  . Colon cancer Unknown        M aunt & M uncle  . Diabetes Maternal Aunt   . Stroke Neg Hx   . Asthma Neg Hx     SOCIAL HISTORY: Social History   Socioeconomic History  . Marital status: Widowed    Spouse name: Not on file  . Number of children: 4  . Years of education: Bachelors +  . Highest education level: Not on file  Occupational History  . Occupation: Retired  Scientific laboratory technician  . Financial  resource strain: Not on file  . Food insecurity    Worry: Not on file    Inability: Not on file  . Transportation needs    Medical: Not on file    Non-medical: Not on file  Tobacco Use  . Smoking status: Former Smoker    Packs/day: 0.25    Years: 25.00    Pack years: 6.25    Types: Cigarettes    Quit date: 11/01/1984    Years since quitting: 34.5  . Smokeless tobacco: Never Used  Substance and Sexual Activity  . Alcohol use: Yes    Comment: whiskey once month  . Drug use: No  . Sexual activity: Never    Birth control/protection: Abstinence  Lifestyle  . Physical activity    Days per week: Not on file    Minutes per session: Not on file  . Stress: Not on file  Relationships  . Social Herbalist on phone: Not on file    Gets together: Not on file    Attends religious service: Not on file    Active member of club or organization: Not on file    Attends meetings of clubs or organizations: Not on file    Relationship status: Not on file  . Intimate partner violence    Fear of current or ex partner: Not on file    Emotionally abused: Not on file    Physically abused: Not on file    Forced sexual activity: Not on file  Other Topics Concern  . Not on file  Social History Narrative   Lives at home alone.   Right-handed.   1 coke per day.   PHYSICAL EXAM  Vitals:   05/22/19 0858  BP: 114/70  Pulse: 70  Temp: 98 F (36.7 C)  SpO2: 96%  Weight: 128 lb (58.1 kg)  Height: 5' (1.524 m)   Body mass index is 25 kg/m.  Generalized: Well developed, in no acute distress   Neurological examination  Mentation: Alert oriented to time, place, history taking. Follows all commands speech and language fluent Cranial nerve II-XII: Pupils were equal round reactive to light. Extraocular movements were full, visual field were full on confrontational test. Facial sensation and strength were normal. Uvula tongue midline. Head turning and shoulder shrug  were normal and  symmetric. Motor: The motor testing reveals 5 over 5 strength of all 4 extremities. Good symmetric motor tone is noted throughout.  Sensory: Sensory testing is intact to soft touch, pinprick and vibration on all 4 extremities. No evidence of extinction is noted.  Coordination: Cerebellar testing reveals good finger-nose-finger and heel-to-shin bilaterally.  Gait and station: Gait is normal. Tandem gait is normal. Romberg is negative. No drift is seen.  Reflexes: Deep tendon reflexes are symmetric and normal bilaterally.   DIAGNOSTIC DATA (LABS, IMAGING, TESTING) - I reviewed patient records, labs, notes, testing and imaging myself where available.  Lab Results  Component Value Date   WBC 8.6 11/24/2012   HGB 13.7 11/24/2012   HCT 39.4 11/24/2012   MCV 88.7 11/24/2012   PLT 235 11/24/2012      Component Value Date/Time   NA 138 12/05/2012 1148   K 4.0 12/05/2012 1148   CL 102 12/05/2012 1148   CO2 29 12/05/2012 1148   GLUCOSE 98 12/05/2012 1148   BUN 13 12/05/2012 1148   CREATININE 0.7 12/05/2012 1148   CALCIUM 9.3 12/05/2012 1148   PROT 6.6 04/19/2017 1148   ALBUMIN 4.0 11/24/2012 2047   AST 15 11/24/2012 2047   ALT 15 11/24/2012 2047   ALKPHOS 52 11/24/2012 2047   BILITOT 0.6 11/24/2012 2047   GFRNONAA 86 (L) 11/24/2012 2047   GFRAA >90 11/24/2012 2047   Lab Results  Component Value Date   CHOL 182 12/05/2012   HDL 63.90 12/05/2012   LDLCALC 93 12/05/2012   LDLDIRECT 165.2 09/11/2009   TRIG 125.0 12/05/2012   CHOLHDL 3 12/05/2012   Lab Results  Component Value Date   HGBA1C 5.2 04/19/2017   Lab Results  Component Value Date   UXYBFXOV29 191 02/21/2014   Lab Results  Component Value Date   TSH 1.27 02/21/2014    ASSESSMENT AND PLAN 85 y.o. year old female  has a past medical history of Anemia, Aortic heart murmur, Arthritis, Celiac disease, Depression, Diverticulitis of colon, Glaucoma, Glaucoma (2012), Heart murmur, History of colonic polyps, Hypertension,  Hypothyroidism, Kidney stone, Osteopenia, Peripheral neuropathy, and Spinal stenosis. here with:  Small fiber peripheral neuropathy -Confirmed by skin biopsy -No treatable etiology found by extensive laboratory evaluation previously -Symptomatic management only -She has stopped gabapentin and Cymbalta, has not noticed much change since off the medication, maybe slightly worse? -She has tried Lyrica, nortriptyline, requip, clonazepam in the past; she was prescribed compound cream, however she thinks it was too expensive to fill -She wants to try a new medication, we will do a trial of Keppra 250 mg twice daily to see if beneficial for neuropathy discomfort, we discussed side effects -Follow up in 4 months, I advised her that if her symptoms worsen or she develops any new symptoms she should let us know.  I spent 15 minutes with the patient. 50% of this time was spent discussing her plan of care.   Butler Denmark, AGNP-C, DNP 05/22/2019, 9:32 AM [facility] [address], [place], [place] [address] [phone]

## 2019-05-22 ENCOUNTER — Ambulatory Visit (INDEPENDENT_AMBULATORY_CARE_PROVIDER_SITE_OTHER): Payer: Medicare Other | Admitting: Neurology

## 2019-05-22 ENCOUNTER — Other Ambulatory Visit: Payer: Self-pay

## 2019-05-22 ENCOUNTER — Encounter: Payer: Self-pay | Admitting: Neurology

## 2019-05-22 VITALS — BP 114/70 | HR 70 | Temp 98.0°F | Ht 60.0 in | Wt 128.0 lb

## 2019-05-22 DIAGNOSIS — G6289 Other specified polyneuropathies: Secondary | ICD-10-CM

## 2019-05-22 MED ORDER — LEVETIRACETAM 250 MG PO TABS: 250.0000 mg | ORAL_TABLET | Freq: Two times a day (BID) | ORAL | 3 refills | Status: DC

## 2019-05-22 NOTE — Patient Instructions (Signed)
It was wonderful to meet you! Let's try the keppra and see if it offers any benefit for your neuropathy.  Levetiracetam tablets What is this medicine? LEVETIRACETAM (lee ve tye RA se tam) is an antiepileptic drug. It is used with other medicines to treat certain types of seizures. This medicine may be used for other purposes; ask your health care provider or pharmacist if you have questions. COMMON BRAND NAME(S): Keppra, Roweepra What should I tell my health care provider before I take this medicine? They need to know if you have any of these conditions:  kidney disease  suicidal thoughts, plans, or attempt; a previous suicide attempt by you or a family member  an unusual or allergic reaction to levetiracetam, other medicines, foods, dyes, or preservatives  pregnant or trying to get pregnant  breast-feeding How should I use this medicine? Take this medicine by mouth with a glass of water. Follow the directions on the prescription label. Swallow the tablets whole. Do not crush or chew this medicine. You may take this medicine with or without food. Take your doses at regular intervals. Do not take your medicine more often than directed. Do not stop taking this medicine or any of your seizure medicines unless instructed by your doctor or health care professional. Stopping your medicine suddenly can increase your seizures or their severity. A special MedGuide will be given to you by the pharmacist with each prescription and refill. Be sure to read this information carefully each time. Contact your pediatrician or health care professional regarding the use of this medication in children. While this drug may be prescribed for children as young as 92 years of age for selected conditions, precautions do apply. Overdosage: If you think you have taken too much of this medicine contact a poison control center or emergency room at once. NOTE: This medicine is only for you. Do not share this medicine with  others. What if I miss a dose? If you miss a dose, take it as soon as you can. If it is almost time for your next dose, take only that dose. Do not take double or extra doses. What may interact with this medicine? This medicine may interact with the following medications:  carbamazepine  colesevelam  probenecid  sevelamer This list may not describe all possible interactions. Give your health care provider a list of all the medicines, herbs, non-prescription drugs, or dietary supplements you use. Also tell them if you smoke, drink alcohol, or use illegal drugs. Some items may interact with your medicine. What should I watch for while using this medicine? Visit your doctor or health care provider for a regular check on your progress. Wear a medical identification bracelet or chain to say you have epilepsy, and carry a card that lists all your medications. This medicine may cause serious skin reactions. They can happen weeks to months after starting the medicine. Contact your health care provider right away if you notice fevers or flu-like symptoms with a rash. The rash may be red or purple and then turn into blisters or peeling of the skin. Or, you might notice a red rash with swelling of the face, lips or lymph nodes in your neck or under your arms. It is important to take this medicine exactly as instructed by your health care provider. When first starting treatment, your dose may need to be adjusted. It may take weeks or months before your dose is stable. You should contact your doctor or health care provider if your  seizures get worse or if you have any new types of seizures. You may get drowsy or dizzy. Do not drive, use machinery, or do anything that needs mental alertness until you know how this medicine affects you. Do not stand or sit up quickly, especially if you are an older patient. This reduces the risk of dizzy or fainting spells. Alcohol may interfere with the effect of this medicine.  Avoid alcoholic drinks. The use of this medicine may increase the chance of suicidal thoughts or actions. Pay special attention to how you are responding while on this medicine. Any worsening of mood, or thoughts of suicide or dying should be reported to your health care provider right away. Women who become pregnant while using this medicine may enroll in the [place] Pregnancy Registry by calling [phone]. This registry collects information about the safety of antiepileptic drug use during pregnancy. What side effects may I notice from receiving this medicine? Side effects that you should report to your doctor or health care professional as soon as possible:  allergic reactions like skin rash, itching or hives, swelling of the face, lips, or tongue  breathing problems  dark urine  general ill feeling or flu-like symptoms  problems with balance, talking, walking  rash, fever, and swollen lymph nodes  redness, blistering, peeling or loosening of the skin, including inside the mouth  unusually weak or tired  worsening of mood, thoughts or actions of suicide or dying  yellowing of the eyes or skin Side effects that usually do not require medical attention (report to your doctor or health care professional if they continue or are bothersome):  diarrhea  dizzy, drowsy  headache  loss of appetite This list may not describe all possible side effects. Call your doctor for medical advice about side effects. You may report side effects to FDA at 1-800-FDA-1088. Where should I keep my medicine? Keep out of reach of children. Store at room temperature between 15 and 30 degrees C (59 and 86 degrees F). Throw away any unused medicine after the expiration date. NOTE: This sheet is a summary. It may not cover all possible information. If you have questions about this medicine, talk to your doctor, pharmacist, or health care provider.  2020 Elsevier/Gold  Standard (2019-01-19 15:23:36)

## 2019-05-22 NOTE — Progress Notes (Signed)
I have reviewed and agreed above plan. 

## 2019-06-04 DIAGNOSIS — Z86010 Personal history of colonic polyps: Secondary | ICD-10-CM | POA: Diagnosis not present

## 2019-06-04 DIAGNOSIS — K900 Celiac disease: Secondary | ICD-10-CM | POA: Diagnosis not present

## 2019-06-28 ENCOUNTER — Ambulatory Visit: Payer: Medicare Other | Admitting: Neurology

## 2019-07-18 DIAGNOSIS — R011 Cardiac murmur, unspecified: Secondary | ICD-10-CM | POA: Diagnosis not present

## 2019-07-18 DIAGNOSIS — E538 Deficiency of other specified B group vitamins: Secondary | ICD-10-CM | POA: Diagnosis not present

## 2019-07-18 DIAGNOSIS — I10 Essential (primary) hypertension: Secondary | ICD-10-CM | POA: Diagnosis not present

## 2019-07-18 DIAGNOSIS — M8580 Other specified disorders of bone density and structure, unspecified site: Secondary | ICD-10-CM | POA: Diagnosis not present

## 2019-07-18 DIAGNOSIS — E039 Hypothyroidism, unspecified: Secondary | ICD-10-CM | POA: Diagnosis not present

## 2019-07-18 DIAGNOSIS — Z23 Encounter for immunization: Secondary | ICD-10-CM | POA: Diagnosis not present

## 2019-07-18 DIAGNOSIS — F329 Major depressive disorder, single episode, unspecified: Secondary | ICD-10-CM | POA: Diagnosis not present

## 2019-09-11 NOTE — Progress Notes (Signed)
PATIENT: Krista Carlson DOB: [birth year]  REASON FOR VISIT: follow up HISTORY FROM: patient  HISTORY OF PRESENT ILLNESS: Today 09/12/19  HISTORY  HISTORYJoanne M Coxis a 85 years old right-handed female, seen in refer by her primary care doctor Crist Infante for evaluation of peripheral neuropathy, initial evaluation was on November 15 2016.  She had past medical history of hypertension, hypothyroidism, on supplement, 1998 biopsy proven celiac disease, on gluten-free diet, she presented with uncontrolled diarrhea for 6 months, had a history of vitamin B12 deficiency, she is still receiving vitamin B-12 intramuscular injection once a month, hyperlipidemia, kidney stone, left ankle fracture in 2013, status post surgical fixation,   She noticed gradual onset bilateral feet paresthesia around 2005, slowly progressive over the years, now ascending to below bilateral knee level, she described constant numb tingling occasionally achy sensation, especially at nighttime, she could not tell if her heating pad is on and off because of constant burning sensation at her lower extremity. She denies weakness, she denies finger paresthesia, she denies significant low back pain, does has left-sided neck strain,  She also has chronic insomnia, is taking trazodone 150 mg at night p.m., go to sleep around 10 PM, she has never tried neuropathic pain medications, was given a prescription of Lyrica 50 mg twice a day just recently.  Update January 18 2017: I reviewed laboratory evaluations, normal creatinine 0.8, WBC 5.1, hemoglobin 14 point 2, LDL 106, normal TSH, free T4, vitamin B12 738,  She has tried Lyrica 50 mg up to 2 tablets every night without helping her symptoms, complains of alternating hot and cold sensation urge to move her feet at nighttime Electrodiagnostic study December 31 2016 showed no large fiber peripheral neuropathy  UPDATE April 19 2017: Skin biopsy did confirm small fiber neuropathy in  March 2018  I reviewed extensive laboratory evaluations in March 2018  EMG nerve conduction study in March 2018 was within normal limits.  Has tried Requip, clonazepam, did not help her symptoms, make her more dizzy, tired at nighttime  Update 01/24/2019 Dr. Krista Blue: She continue complains of bilateral feet paresthesia despite polypharmacy gabapentin 600 mg 3 times a day, Cymbalta 30 mg twice a day.  She is also taking trazodone 150 mg every night to help her sleep better, she continued B12 injection  Update May 22 2019 SS: Small fiber peripheral neuropathy confirmed by skin biopsy, since last visit she has stopped her gabapentin and Cymbalta.  In the past she has tried Lyrica, nortriptyline, compounded cream.  She has not noticed much change since being off the gabapentin and Cymbalta, maybe the pain is slightly worse?  She continues to complain of burning, stinging, coldness, to bilateral lower extremities.  She is able to sleep at night with the aid of trazodone.  The pain is constant throughout the day. She lives alone, drives a car without difficulty.  She does not use assistive devices, she has not had any falls.  Update September 12, 2019 SS: After last visit, she tried Keppra, but reports she stopped it after 1 month due to lack of benefit.  In the past she has tried gabapentin, Cymbalta, Lyrica, nortriptyline, and compound cream.  She continues to complain of tingling and burning in her feet, is worse when sitting down.  She says she does well while she is up moving around.  At night, her feet feel cold.  She says she sleeps well at night for 5 hours, then wakes up to use the bathroom.  She has recently started seeing a chiropractor twice a week for neuropathy.  She does not think it is helping.  She denies any falls.  She continues to live alone and operate a car without difficulty.  REVIEW OF SYSTEMS: Out of a complete 14 system review of symptoms, the patient complains only of the  following symptoms, and all other reviewed systems are negative.  Tingling, burning  ALLERGIES: Allergies  Allergen Reactions  . Nickel     Redness and discomfort ; contact dermatitis skin turns red  . Other     Wheat, barley, gluten rye- celiac disease    HOME MEDICATIONS: Outpatient Medications Prior to Visit  Medication Sig Dispense Refill  . alendronate (FOSAMAX) 70 MG tablet Take 1 tablet by mouth once a week. Wednesday    . amLODipine (NORVASC) 5 MG tablet Take 5 mg by mouth every morning.    . Calcium Carbonate-Vitamin D (CALCIUM + D PO) Take 600 mg by mouth 2 (two) times daily.     . Cyanocobalamin 1000 MCG/ML KIT Inject as directed. Every 30 days    . cycloSPORINE (RESTASIS) 0.05 % ophthalmic emulsion Place 1 drop into both eyes 2 (two) times daily.     . Glucosamine-Chondroitin (COSAMIN DS PO) Take 1 tablet by mouth 2 (two) times daily.      Marland Kitchen latanoprost (XALATAN) 0.005 % ophthalmic solution Place 1 drop into both eyes at bedtime.    Marland Kitchen levothyroxine (SYNTHROID, LEVOTHROID) 112 MCG tablet Take 56-112 mcg by mouth as directed.    . multivitamin (THERAGRAN) per tablet Take 1 tablet by mouth daily.      . NONFORMULARY OR COMPOUNDED ITEM     . Omega-3 Fatty Acids (FISH OIL) 1000 MG CAPS Take 1,000 mg by mouth 2 (two) times daily.    . rosuvastatin (CRESTOR) 20 MG tablet Take 10 mg by mouth daily.     . traZODone (DESYREL) 100 MG tablet Take 200 mg by mouth at bedtime.    Marland Kitchen HYDROcodone-acetaminophen (NORCO/VICODIN) 5-325 MG tablet Take 1-2 tablets by mouth every 6 (six) hours as needed for severe pain. (Patient not taking: Reported on 09/12/2019) 10 tablet 0  . levETIRAcetam (KEPPRA) 250 MG tablet Take 1 tablet (250 mg total) by mouth 2 (two) times daily. (Patient not taking: Reported on 09/12/2019) 60 tablet 3  . rosuvastatin (CRESTOR) 10 MG tablet Take 10 mg by mouth at bedtime.     No facility-administered medications prior to visit.     PAST MEDICAL HISTORY: Past Medical  History:  Diagnosis Date  . Anemia   . Aortic heart murmur   . Arthritis   . Celiac disease    Dr Wynetta Emery  . Depression   . Diverticulitis of colon   . Glaucoma   . Glaucoma 2012  . Heart murmur   . History of colonic polyps    3 mm, Dr Wynetta Emery  . Hypertension   . Hypothyroidism   . Kidney stone    stone in left kidney now  . Osteopenia   . Peripheral neuropathy   . Spinal stenosis     PAST SURGICAL HISTORY: Past Surgical History:  Procedure Laterality Date  . APPENDECTOMY  1966   @ tubal  . BREAST CYST ASPIRATION Left 1983  . CATARACT EXTRACTION Bilateral yrs ago  . COLON BIOPSY  1998   Celiac Sprue; Dr Wynetta Emery  . COLONOSCOPY  2010   Tics& polyp  . COLONOSCOPY WITH PROPOFOL N/A 06/25/2014   Procedure: COLONOSCOPY WITH PROPOFOL;  Surgeon: Garlan Fair, MD;  Location: [facility];  Service: Endoscopy;  Laterality: N/A;  . ESOPHAGOGASTRODUODENOSCOPY (EGD) WITH PROPOFOL N/A 06/25/2014   Procedure: ESOPHAGOGASTRODUODENOSCOPY (EGD) WITH PROPOFOL;  Surgeon: Garlan Fair, MD;  Location: [facility];  Service: Endoscopy;  Laterality: N/A;  . HEMORRHOID SURGERY    . HERNIA REPAIR  1943   Right Side  . INGUINAL HERNIA REPAIR  11/16/2011   Procedure: HERNIA REPAIR INGUINAL ADULT;  Surgeon: Edward Jolly, MD;  Location: [facility];  Service: General;  Laterality: Left;  with mesh  . KNEE ARTHROSCOPY  2007   Left  . NOSE SURGERY  2007   Fractured Nose Reset  . orif broken ankle Left 2013  . POLYPECTOMY  2010   due 2015, 3 mm  . TONSILLECTOMY AND ADENOIDECTOMY  1941  . TUBAL LIGATION  1966    FAMILY HISTORY: Family History  Problem Relation Age of Onset  . Depression Mother        manic  . Heart attack Mother 42  . COPD Mother        heavy smoker  . Coronary artery disease Maternal Uncle        X 6  . Cancer Maternal Uncle        intestinal  . Diabetes Maternal Uncle        X2  . Breast cancer Paternal Aunt 29  . Depression Unknown        strong  maternal hx with mania  . Diverticulitis Unknown        M uncle & M aunt  . Colon cancer Unknown        M aunt & M uncle  . Diabetes Maternal Aunt   . Stroke Neg Hx   . Asthma Neg Hx     SOCIAL HISTORY: Social History   Socioeconomic History  . Marital status: Widowed    Spouse name: Not on file  . Number of children: 4  . Years of education: Bachelors +  . Highest education level: Not on file  Occupational History  . Occupation: Retired  Scientific laboratory technician  . Financial resource strain: Not on file  . Food insecurity    Worry: Not on file    Inability: Not on file  . Transportation needs    Medical: Not on file    Non-medical: Not on file  Tobacco Use  . Smoking status: Former Smoker    Packs/day: 0.25    Years: 25.00    Pack years: 6.25    Types: Cigarettes    Quit date: 11/01/1984    Years since quitting: 34.8  . Smokeless tobacco: Never Used  Substance and Sexual Activity  . Alcohol use: Yes    Comment: whiskey once month  . Drug use: No  . Sexual activity: Never    Birth control/protection: Abstinence  Lifestyle  . Physical activity    Days per week: Not on file    Minutes per session: Not on file  . Stress: Not on file  Relationships  . Social Herbalist on phone: Not on file    Gets together: Not on file    Attends religious service: Not on file    Active member of club or organization: Not on file    Attends meetings of clubs or organizations: Not on file    Relationship status: Not on file  . Intimate partner violence    Fear of current or ex partner: Not on file  Emotionally abused: Not on file    Physically abused: Not on file    Forced sexual activity: Not on file  Other Topics Concern  . Not on file  Social History Narrative   Lives at home alone.   Right-handed.   1 coke per day.   PHYSICAL EXAM  Vitals:   09/12/19 1334  BP: 122/75  Pulse: 66  Temp: 98.4 F (36.9 C)  Weight: 130 lb (59 kg)  Height: 5' (1.524 m)   Body  mass index is 25.39 kg/m.  Generalized: Well developed, in no acute distress   Neurological examination  Mentation: Alert oriented to time, place, history taking. Follows all commands speech and language fluent Cranial nerve II-XII: Pupils were equal round reactive to light. Extraocular movements were full, visual field were full on confrontational test. Facial sensation and strength were normal. Head turning and shoulder shrug were normal and symmetric. Motor: The motor testing reveals 5 over 5 strength of all 4 extremities. Good symmetric motor tone is noted throughout.  Sensory: Sensory testing is intact to soft touch, pinprick and vibration in bilateral lower extremities.  Coordination: Cerebellar testing reveals good finger-nose-finger and heel-to-shin bilaterally.  Gait and station: Gait is normal. Tandem gait is normal. Romberg is negative, but tends to lean forward. Reflexes: Deep tendon reflexes are symmetric and normal bilaterally.   DIAGNOSTIC DATA (LABS, IMAGING, TESTING) - I reviewed patient records, labs, notes, testing and imaging myself where available.  Lab Results  Component Value Date   WBC 8.6 11/24/2012   HGB 13.7 11/24/2012   HCT 39.4 11/24/2012   MCV 88.7 11/24/2012   PLT 235 11/24/2012      Component Value Date/Time   NA 138 12/05/2012 1148   K 4.0 12/05/2012 1148   CL 102 12/05/2012 1148   CO2 29 12/05/2012 1148   GLUCOSE 98 12/05/2012 1148   BUN 13 12/05/2012 1148   CREATININE 0.7 12/05/2012 1148   CALCIUM 9.3 12/05/2012 1148   PROT 6.6 04/19/2017 1148   ALBUMIN 4.0 11/24/2012 2047   AST 15 11/24/2012 2047   ALT 15 11/24/2012 2047   ALKPHOS 52 11/24/2012 2047   BILITOT 0.6 11/24/2012 2047   GFRNONAA 86 (L) 11/24/2012 2047   GFRAA >90 11/24/2012 2047   Lab Results  Component Value Date   CHOL 182 12/05/2012   HDL 63.90 12/05/2012   LDLCALC 93 12/05/2012   LDLDIRECT 165.2 09/11/2009   TRIG 125.0 12/05/2012   CHOLHDL 3 12/05/2012   Lab  Results  Component Value Date   HGBA1C 5.2 04/19/2017   Lab Results  Component Value Date   MWNUUVOZ36 644 02/21/2014   Lab Results  Component Value Date   TSH 1.27 02/21/2014    ASSESSMENT AND PLAN 85 y.o. year old female  has a past medical history of Anemia, Aortic heart murmur, Arthritis, Celiac disease, Depression, Diverticulitis of colon, Glaucoma, Glaucoma (2012), Heart murmur, History of colonic polyps, Hypertension, Hypothyroidism, Kidney stone, Osteopenia, Peripheral neuropathy, and Spinal stenosis. here with:  1.  Small fiber neuropathy -Confirmed by skin biopsy -No treatable etiology found by extensive laboratory evaluation previously -Symptomatic management only, she has tried gabapentin, Cymbalta, Lyrica, nortriptyline, Requip, clonazepam, Keppra -After discussion, we will retry Lyrica 50 mg twice a day, I sent in a 45-monthprescription, if she likes the medication, she will let me know, we may increase the dose if needed -She will follow-up in 6 months or sooner if needed, she is now seeing a chiropractor for neuropathy care  I spent 15 minutes with the patient. 50% of this time was spent discussing the plan of care.  Butler Denmark, AGNP-C, DNP 09/12/2019, 1:45 PM [facility] [address], [place], [place] [id number] [phone]

## 2019-09-12 ENCOUNTER — Encounter: Payer: Self-pay | Admitting: Neurology

## 2019-09-12 ENCOUNTER — Other Ambulatory Visit: Payer: Self-pay

## 2019-09-12 ENCOUNTER — Ambulatory Visit (INDEPENDENT_AMBULATORY_CARE_PROVIDER_SITE_OTHER): Payer: Medicare Other | Admitting: Neurology

## 2019-09-12 VITALS — BP 122/75 | HR 66 | Temp 98.4°F | Ht 60.0 in | Wt 130.0 lb

## 2019-09-12 DIAGNOSIS — G6289 Other specified polyneuropathies: Secondary | ICD-10-CM | POA: Diagnosis not present

## 2019-09-12 MED ORDER — PREGABALIN 50 MG PO CAPS: 50.0000 mg | ORAL_CAPSULE | Freq: Two times a day (BID) | ORAL | 3 refills | Status: DC

## 2019-09-12 NOTE — Patient Instructions (Signed)
You may retry Lyrica 50 mg twPregabalin capsules What is this medicine? PREGABALIN (pre GAB a lin) is used to treat nerve pain from diabetes, shingles, spinal cord injury, and fibromyalgia. It is also used to control seizures in epilepsy. This medicine may be used for other purposes; ask your health care provider or pharmacist if you have questions. COMMON BRAND NAME(S): Lyrica What should I tell my health care provider before I take this medicine? They need to know if you have any of these conditions:  heart disease  history of drug abuse or alcohol abuse problem  kidney disease  lung or breathing disease  suicidal thoughts, plans, or attempt; a previous suicide attempt by you or a family member  an unusual or allergic reaction to pregabalin, gabapentin, other medicines, foods, dyes, or preservatives  pregnant or trying to get pregnant  breast-feeding How should I use this medicine? Take this medicine by mouth with a glass of water. Follow the directions on the prescription label. You can take it with or without food. If it upsets your stomach, take it with food. Take your medicine at regular intervals. Do not take it more often than directed. Do not stop taking except on your doctor's advice. A special MedGuide will be given to you by the pharmacist with each prescription and refill. Be sure to read this information carefully each time. Talk to your pediatrician regarding the use of this medicine in children. While this drug may be prescribed for children as young as 1 month for selected conditions, precautions do apply. Overdosage: If you think you have taken too much of this medicine contact a poison control center or emergency room at once. NOTE: This medicine is only for you. Do not share this medicine with others. What if I miss a dose? If you miss a dose, take it as soon as you can. If it is almost time for your next dose, take only that dose. Do not take double or extra  doses. What may interact with this medicine? This medicine may interact with the following medications:  alcohol  antihistamines for allergy, cough, and cold  certain medicines for anxiety or sleep  certain medicines for depression like amitriptyline, fluoxetine, sertraline  certain medicines for diabetes  certain medicines for seizures like phenobarbital, primidone  general anesthetics like halothane, isoflurane, methoxyflurane, propofol  local anesthetics like lidocaine, pramoxine, tetracaine  medicines that relax muscles for surgery  narcotic medicines for pain  phenothiazines like chlorpromazine, mesoridazine, prochlorperazine, thioridazine This list may not describe all possible interactions. Give your health care provider a list of all the medicines, herbs, non-prescription drugs, or dietary supplements you use. Also tell them if you smoke, drink alcohol, or use illegal drugs. Some items may interact with your medicine. What should I watch for while using this medicine? Tell your doctor or healthcare professional if your symptoms do not start to get better or if they get worse. Visit your doctor or health care professional for regular checks on your progress. Do not stop taking except on your doctor's advice. You may develop a severe reaction. Your doctor will tell you how much medicine to take. Wear a medical identification bracelet or chain if you are taking this medicine for seizures, and carry a card that describes your disease and details of your medicine and dosage times. You may get drowsy or dizzy. Do not drive, use machinery, or do anything that needs mental alertness until you know how this medicine affects you. Do not stand  or sit up quickly, especially if you are an older patient. This reduces the risk of dizzy or fainting spells. Alcohol may interfere with the effect of this medicine. Avoid alcoholic drinks. If you have a heart condition, like congestive heart  failure, and notice that you are retaining water and have swelling in your hands or feet, contact your health care provider immediately. The use of this medicine may increase the chance of suicidal thoughts or actions. Pay special attention to how you are responding while on this medicine. Any worsening of mood, or thoughts of suicide or dying should be reported to your health care professional right away. This medicine has caused reduced sperm counts in some men. This may interfere with the ability to father a child. You should talk to your doctor or health care professional if you are concerned about your fertility. Women who become pregnant while using this medicine for seizures may enroll in the [place] Pregnancy Registry by calling [phone]. This registry collects information about the safety of antiepileptic drug use during pregnancy. What side effects may I notice from receiving this medicine? Side effects that you should report to your doctor or health care professional as soon as possible:  allergic reactions like skin rash, itching or hives, swelling of the face, lips, or tongue  breathing problems  changes in vision  chest pain  confusion  jerking or unusual movements of any part of your body  loss of memory  muscle pain, tenderness, or weakness  suicidal thoughts or other mood changes  swelling of the ankles, feet, hands  unusual bruising or bleeding Side effects that usually do not require medical attention (report to your doctor or health care professional if they continue or are bothersome):  dizziness  drowsiness  dry mouth  headache  nausea  tremors  trouble sleeping  weight gain This list may not describe all possible side effects. Call your doctor for medical advice about side effects. You may report side effects to FDA at 1-800-FDA-1088. Where should I keep my medicine? Keep out of the reach of children. This medicine  can be abused. Keep your medicine in a safe place to protect it from theft. Do not share this medicine with anyone. Selling or giving away this medicine is dangerous and against the law. This medicine may cause accidental overdose and death if it taken by other adults, children, or pets. Mix any unused medicine with a substance like cat litter or coffee grounds. Then throw the medicine away in a sealed container like a sealed bag or a coffee can with a lid. Do not use the medicine after the expiration date. Store at room temperature between 15 and 30 degrees C (59 and 86 degrees F). NOTE: This sheet is a summary. It may not cover all possible information. If you have questions about this medicine, talk to your doctor, pharmacist, or health care provider.  2020 Elsevier/Gold Standard (2018-10-20 13:15:55) ice a day for your neuropathy pain.

## 2019-09-13 ENCOUNTER — Telehealth: Payer: Self-pay

## 2019-09-13 NOTE — Telephone Encounter (Signed)
Pending approval for Lyrica 150 mg Key: [id number] ICD 1o code: G62.9 PA Case ID: [id number]   I will update once a decision has been made.

## 2019-09-14 DIAGNOSIS — M8589 Other specified disorders of bone density and structure, multiple sites: Secondary | ICD-10-CM | POA: Diagnosis not present

## 2019-09-17 NOTE — Progress Notes (Signed)
I have reviewed and agreed above plan. 

## 2019-09-18 NOTE — Telephone Encounter (Signed)
Received a approval for Lyrica 150 mg. Approved through 09-13-2020. A copy of the approval letter has been faxed to the pharmacy listed below. Confirmation fax has been received.         [place], [place] [phone] (Phone) [phone] (Fax)

## 2019-10-01 ENCOUNTER — Other Ambulatory Visit (HOSPITAL_COMMUNITY): Payer: Self-pay | Admitting: Internal Medicine

## 2019-10-01 DIAGNOSIS — M79604 Pain in right leg: Secondary | ICD-10-CM

## 2019-10-02 ENCOUNTER — Ambulatory Visit (HOSPITAL_COMMUNITY)
Admission: RE | Admit: 2019-10-02 | Discharge: 2019-10-02 | Disposition: A | Discharge status: Home / Self Care | Payer: Medicare Other | Source: Ambulatory Visit | Attending: Family | Admitting: Family

## 2019-10-02 ENCOUNTER — Other Ambulatory Visit: Payer: Self-pay

## 2019-10-02 DIAGNOSIS — M79604 Pain in right leg: Secondary | ICD-10-CM

## 2019-10-02 DIAGNOSIS — M79605 Pain in left leg: Secondary | ICD-10-CM | POA: Diagnosis not present

## 2019-11-27 DIAGNOSIS — M79672 Pain in left foot: Secondary | ICD-10-CM | POA: Diagnosis not present

## 2019-11-27 DIAGNOSIS — S92355D Nondisplaced fracture of fifth metatarsal bone, left foot, subsequent encounter for fracture with routine healing: Secondary | ICD-10-CM | POA: Diagnosis not present

## 2019-12-25 DIAGNOSIS — M79672 Pain in left foot: Secondary | ICD-10-CM | POA: Diagnosis not present

## 2019-12-25 DIAGNOSIS — S92355D Nondisplaced fracture of fifth metatarsal bone, left foot, subsequent encounter for fracture with routine healing: Secondary | ICD-10-CM | POA: Diagnosis not present

## 2020-01-09 ENCOUNTER — Other Ambulatory Visit: Payer: Self-pay | Admitting: Neurology

## 2020-01-10 NOTE — Telephone Encounter (Signed)
Pt is due for a refill on lyrica. Pt is up to date on her appts. [place] Controlled Substance Registry checked. Last lyrica refill was 12/09/2019. No other prescribers listed.

## 2020-01-22 DIAGNOSIS — S92355D Nondisplaced fracture of fifth metatarsal bone, left foot, subsequent encounter for fracture with routine healing: Secondary | ICD-10-CM | POA: Diagnosis not present

## 2020-01-22 DIAGNOSIS — M79672 Pain in left foot: Secondary | ICD-10-CM | POA: Diagnosis not present

## 2020-02-20 DIAGNOSIS — H35033 Hypertensive retinopathy, bilateral: Secondary | ICD-10-CM | POA: Diagnosis not present

## 2020-02-20 DIAGNOSIS — H401132 Primary open-angle glaucoma, bilateral, moderate stage: Secondary | ICD-10-CM | POA: Diagnosis not present

## 2020-02-20 DIAGNOSIS — H04123 Dry eye syndrome of bilateral lacrimal glands: Secondary | ICD-10-CM | POA: Diagnosis not present

## 2020-02-20 DIAGNOSIS — H0100A Unspecified blepharitis right eye, upper and lower eyelids: Secondary | ICD-10-CM | POA: Diagnosis not present

## 2020-03-12 ENCOUNTER — Ambulatory Visit: Payer: Medicare Other | Admitting: Neurology

## 2020-03-19 DIAGNOSIS — I10 Essential (primary) hypertension: Secondary | ICD-10-CM | POA: Diagnosis not present

## 2020-03-19 DIAGNOSIS — E038 Other specified hypothyroidism: Secondary | ICD-10-CM | POA: Diagnosis not present

## 2020-03-25 DIAGNOSIS — E538 Deficiency of other specified B group vitamins: Secondary | ICD-10-CM | POA: Diagnosis not present

## 2020-03-25 DIAGNOSIS — M8580 Other specified disorders of bone density and structure, unspecified site: Secondary | ICD-10-CM | POA: Diagnosis not present

## 2020-03-25 DIAGNOSIS — Z1331 Encounter for screening for depression: Secondary | ICD-10-CM | POA: Diagnosis not present

## 2020-03-25 DIAGNOSIS — G609 Hereditary and idiopathic neuropathy, unspecified: Secondary | ICD-10-CM | POA: Diagnosis not present

## 2020-03-25 DIAGNOSIS — M419 Scoliosis, unspecified: Secondary | ICD-10-CM | POA: Diagnosis not present

## 2020-03-25 DIAGNOSIS — R011 Cardiac murmur, unspecified: Secondary | ICD-10-CM | POA: Diagnosis not present

## 2020-03-25 DIAGNOSIS — I10 Essential (primary) hypertension: Secondary | ICD-10-CM | POA: Diagnosis not present

## 2020-03-25 DIAGNOSIS — K900 Celiac disease: Secondary | ICD-10-CM | POA: Diagnosis not present

## 2020-03-25 DIAGNOSIS — E039 Hypothyroidism, unspecified: Secondary | ICD-10-CM | POA: Diagnosis not present

## 2020-03-25 DIAGNOSIS — Z0000 Encounter for general adult medical examination without abnormal findings: Secondary | ICD-10-CM | POA: Diagnosis not present

## 2020-03-25 DIAGNOSIS — F329 Major depressive disorder, single episode, unspecified: Secondary | ICD-10-CM | POA: Diagnosis not present

## 2020-03-25 DIAGNOSIS — Z1339 Encounter for screening examination for other mental health and behavioral disorders: Secondary | ICD-10-CM | POA: Diagnosis not present

## 2020-03-25 DIAGNOSIS — R3121 Asymptomatic microscopic hematuria: Secondary | ICD-10-CM | POA: Diagnosis not present

## 2020-03-25 DIAGNOSIS — H409 Unspecified glaucoma: Secondary | ICD-10-CM | POA: Diagnosis not present

## 2020-03-25 DIAGNOSIS — R82998 Other abnormal findings in urine: Secondary | ICD-10-CM | POA: Diagnosis not present

## 2020-04-01 DIAGNOSIS — Z0000 Encounter for general adult medical examination without abnormal findings: Secondary | ICD-10-CM | POA: Diagnosis not present

## 2020-05-09 DIAGNOSIS — Z148 Genetic carrier of other disease: Secondary | ICD-10-CM | POA: Diagnosis not present

## 2020-05-09 DIAGNOSIS — I499 Cardiac arrhythmia, unspecified: Secondary | ICD-10-CM | POA: Diagnosis not present

## 2020-05-09 DIAGNOSIS — E7800 Pure hypercholesterolemia, unspecified: Secondary | ICD-10-CM | POA: Diagnosis not present

## 2020-05-09 DIAGNOSIS — Z1589 Genetic susceptibility to other disease: Secondary | ICD-10-CM | POA: Diagnosis not present

## 2020-05-09 DIAGNOSIS — I420 Dilated cardiomyopathy: Secondary | ICD-10-CM | POA: Diagnosis not present

## 2020-05-09 DIAGNOSIS — Z8249 Family history of ischemic heart disease and other diseases of the circulatory system: Secondary | ICD-10-CM | POA: Diagnosis not present

## 2020-05-09 DIAGNOSIS — I10 Essential (primary) hypertension: Secondary | ICD-10-CM | POA: Diagnosis not present

## 2020-05-09 DIAGNOSIS — Z136 Encounter for screening for cardiovascular disorders: Secondary | ICD-10-CM | POA: Diagnosis not present

## 2020-05-14 DIAGNOSIS — Z8249 Family history of ischemic heart disease and other diseases of the circulatory system: Secondary | ICD-10-CM | POA: Diagnosis not present

## 2020-05-14 DIAGNOSIS — E7800 Pure hypercholesterolemia, unspecified: Secondary | ICD-10-CM | POA: Diagnosis not present

## 2020-05-14 DIAGNOSIS — Z136 Encounter for screening for cardiovascular disorders: Secondary | ICD-10-CM | POA: Diagnosis not present

## 2020-05-14 DIAGNOSIS — I499 Cardiac arrhythmia, unspecified: Secondary | ICD-10-CM | POA: Diagnosis not present

## 2020-05-14 DIAGNOSIS — I420 Dilated cardiomyopathy: Secondary | ICD-10-CM | POA: Diagnosis not present

## 2020-05-14 DIAGNOSIS — Z1589 Genetic susceptibility to other disease: Secondary | ICD-10-CM | POA: Diagnosis not present

## 2020-05-14 DIAGNOSIS — Z148 Genetic carrier of other disease: Secondary | ICD-10-CM | POA: Diagnosis not present

## 2020-05-14 DIAGNOSIS — I10 Essential (primary) hypertension: Secondary | ICD-10-CM | POA: Diagnosis not present

## 2020-05-25 DIAGNOSIS — I10 Essential (primary) hypertension: Secondary | ICD-10-CM | POA: Diagnosis not present

## 2020-08-28 DIAGNOSIS — H0100A Unspecified blepharitis right eye, upper and lower eyelids: Secondary | ICD-10-CM | POA: Diagnosis not present

## 2020-08-28 DIAGNOSIS — H35033 Hypertensive retinopathy, bilateral: Secondary | ICD-10-CM | POA: Diagnosis not present

## 2020-08-28 DIAGNOSIS — H04123 Dry eye syndrome of bilateral lacrimal glands: Secondary | ICD-10-CM | POA: Diagnosis not present

## 2020-08-28 DIAGNOSIS — H401132 Primary open-angle glaucoma, bilateral, moderate stage: Secondary | ICD-10-CM | POA: Diagnosis not present

## 2020-08-30 DIAGNOSIS — Z23 Encounter for immunization: Secondary | ICD-10-CM | POA: Diagnosis not present

## 2020-09-16 DIAGNOSIS — Z23 Encounter for immunization: Secondary | ICD-10-CM | POA: Diagnosis not present

## 2020-09-30 DIAGNOSIS — E039 Hypothyroidism, unspecified: Secondary | ICD-10-CM | POA: Diagnosis not present

## 2020-09-30 DIAGNOSIS — K900 Celiac disease: Secondary | ICD-10-CM | POA: Diagnosis not present

## 2020-09-30 DIAGNOSIS — G609 Hereditary and idiopathic neuropathy, unspecified: Secondary | ICD-10-CM | POA: Diagnosis not present

## 2020-09-30 DIAGNOSIS — I10 Essential (primary) hypertension: Secondary | ICD-10-CM | POA: Diagnosis not present

## 2020-09-30 DIAGNOSIS — F329 Major depressive disorder, single episode, unspecified: Secondary | ICD-10-CM | POA: Diagnosis not present

## 2020-09-30 DIAGNOSIS — E538 Deficiency of other specified B group vitamins: Secondary | ICD-10-CM | POA: Diagnosis not present

## 2020-11-01 DIAGNOSIS — S82899A Other fracture of unspecified lower leg, initial encounter for closed fracture: Secondary | ICD-10-CM

## 2020-11-01 HISTORY — DX: Other fracture of unspecified lower leg, initial encounter for closed fracture: S82.899A

## 2020-11-10 DIAGNOSIS — M25572 Pain in left ankle and joints of left foot: Secondary | ICD-10-CM | POA: Diagnosis not present

## 2020-11-10 DIAGNOSIS — S93402A Sprain of unspecified ligament of left ankle, initial encounter: Secondary | ICD-10-CM | POA: Diagnosis not present

## 2020-11-10 DIAGNOSIS — S8262XA Displaced fracture of lateral malleolus of left fibula, initial encounter for closed fracture: Secondary | ICD-10-CM | POA: Diagnosis not present

## 2020-11-19 ENCOUNTER — Ambulatory Visit: Payer: Medicare Other | Admitting: Neurology

## 2020-12-03 DIAGNOSIS — S8262XD Displaced fracture of lateral malleolus of left fibula, subsequent encounter for closed fracture with routine healing: Secondary | ICD-10-CM | POA: Diagnosis not present

## 2020-12-11 ENCOUNTER — Telehealth: Payer: Self-pay | Admitting: Neurology

## 2020-12-11 NOTE — Telephone Encounter (Signed)
Morning  - Pt Krista Carlson is here with a friend for her appt and wanted to schedule an appt with Dr. Krista Blue. She had a January appt but it was cancelled so I know Dr Krista Blue is booking out through April. Is there anyway you can look at your schedule her earlier for an afternoon appt? I have her on for 4/14 and put her on the waitlist. She also stated she called several times and her call wasn't returned. Thank you

## 2020-12-11 NOTE — Telephone Encounter (Signed)
I spoke to the patient. She would like to see Dr. Krista Blue for this next visit. She has been rescheduled to 12/17/20.

## 2020-12-17 ENCOUNTER — Ambulatory Visit (INDEPENDENT_AMBULATORY_CARE_PROVIDER_SITE_OTHER): Payer: Medicare Other | Admitting: Neurology

## 2020-12-17 ENCOUNTER — Encounter: Payer: Self-pay | Admitting: Neurology

## 2020-12-17 ENCOUNTER — Other Ambulatory Visit: Payer: Self-pay

## 2020-12-17 VITALS — BP 127/76 | HR 67 | Ht 60.0 in | Wt 134.5 lb

## 2020-12-17 DIAGNOSIS — G2581 Restless legs syndrome: Secondary | ICD-10-CM

## 2020-12-17 DIAGNOSIS — G629 Polyneuropathy, unspecified: Secondary | ICD-10-CM | POA: Diagnosis not present

## 2020-12-17 DIAGNOSIS — S8262XA Displaced fracture of lateral malleolus of left fibula, initial encounter for closed fracture: Secondary | ICD-10-CM | POA: Diagnosis not present

## 2020-12-17 MED ORDER — DICLOFENAC SODIUM 1 % EX GEL: CUTANEOUS | 11 refills | Status: AC

## 2020-12-17 MED ORDER — LIDOCAINE-PRILOCAINE 2.5-2.5 % EX CREA: TOPICAL_CREAM | CUTANEOUS | 11 refills | Status: DC

## 2020-12-17 MED ORDER — METANX 3-90.314-2-35 MG PO CAPS: 1.0000 | ORAL_CAPSULE | Freq: Two times a day (BID) | ORAL | 11 refills | Status: DC

## 2020-12-17 NOTE — Progress Notes (Signed)
HISTORY OF PRESENT ILLNESS: Krista Carlson a 87 years old right-handed female, seen in refer by her primary care doctor Crist Infante for evaluation of peripheral neuropathy, initial evaluation was on November 15 2016.  She had past medical history of hypertension, hypothyroidism, on supplement, 1998 biopsy proven celiac disease, on gluten-free diet, she presented with uncontrolled diarrhea for 6 months, had a history of vitamin B12 deficiency, she is still receiving vitamin B-12 intramuscular injection once a month, hyperlipidemia, kidney stone, left ankle fracture in 2013, status post surgical fixation,   She noticed gradual onset bilateral feet paresthesia around 2005, slowly progressive over the years, now ascending to below bilateral knee level, she described constant numb tingling occasionally achy sensation, especially at nighttime, she could not tell if her heating pad is on and off because of constant burning sensation at her lower extremity. She denies weakness, she denies finger paresthesia, she denies significant low back pain, does has left-sided neck strain,  She also has chronic insomnia, is taking trazodone 150 mg at night p.m., go to sleep around 10 PM, she has never tried neuropathic pain medications, was given a prescription of Lyrica 50 mg twice a day just recently.  Update January 18 2017: Laboratory evaluations, normal creatinine 0.8, WBC 5.1, hemoglobin 14 point 2, LDL 106, normal TSH, free T4, vitamin B12 738,  She has tried Lyrica 50 mg up to 2 tablets every night without helping her symptoms, complains of alternating hot and cold sensation urge to move her feet at nighttime Electrodiagnostic study December 31 2016 showed no large fiber peripheral neuropathy  UPDATE April 19 2017: Skin biopsy did confirm small fiber neuropathy in March 2018  I reviewed extensive laboratory evaluations in March 2018,   EMG nerve conduction study in March 2018 was within normal  limits.  Has tried Requip, clonazepam, did not help her symptoms, make her more dizzy, tired at nighttime  Update 01/24/2019 She continue complains of bilateral feet paresthesia despite polypharmacy gabapentin 600 mg 3 times a day, Cymbalta 30 mg twice a day.  She is also taking trazodone 150 mg every night to help her sleep better, she continued B12 injection  UPDATE Dec 17 2020: She is overall doing well other than continue with bilateral feet and leg paresthesia, cold sensation, she take gabapentin 300 mg 2 tablets 3 times a day, during the daytime her symptoms are manageable, but at nighttime, she felt cold pain, has to use heating pad all night long, when she first got up in the morning, she also felt lower extremity cold sensation, she is taking trazodone 100 mg 2 tablets every night for sleep, she sleeps well,  Skin biopsy confirmed Small fiber peripheral neuropathy, laboratory evaluation failed to demonstrate treatable etiology  Previously she has tried Cymbalta, not sure about the benefit, also a short trial of Keppra, Lyrica in the past without significant improvement, She described urge to move her left with prolonged sitting, especially when she tries to go to bed, take trazodone 200 mg every night sleeps well    REVIEW OF SYSTEMS: Out of a complete 14 system review of symptoms, the patient complains only of the following symptoms, and all other reviewed systems are negative.  Tingling, burning  ALLERGIES: Allergies  Allergen Reactions  . Nickel     Redness and discomfort ; contact dermatitis skin turns red  . Other     Wheat, barley, gluten rye- celiac disease    HOME MEDICATIONS: Outpatient Medications Prior to Visit  Medication Sig Dispense Refill  . alendronate (FOSAMAX) 70 MG tablet Take 1 tablet by mouth once a week. Wednesday    . amLODipine (NORVASC) 5 MG tablet Take 5 mg by mouth every morning.    . Calcium Carbonate-Vitamin D (CALCIUM + D PO) Take 600 mg by  mouth 2 (two) times daily.    . Cyanocobalamin 1000 MCG/ML KIT Inject as directed. Every 30 days    . cycloSPORINE (RESTASIS) 0.05 % ophthalmic emulsion Place 1 drop into both eyes 2 (two) times daily.    . DULoxetine (CYMBALTA) 30 MG capsule duloxetine 30 mg capsule,delayed release  TAKE (1) CAPSULE DAILY.    Marland Kitchen gabapentin (NEURONTIN) 300 MG capsule Take 600 mg by mouth 3 (three) times daily.    . Glucosamine-Chondroitin (COSAMIN DS PO) Take 1 tablet by mouth 2 (two) times daily.    Marland Kitchen latanoprost (XALATAN) 0.005 % ophthalmic solution Place 1 drop into both eyes at bedtime.    Marland Kitchen levothyroxine (SYNTHROID, LEVOTHROID) 112 MCG tablet Take 56-112 mcg by mouth as directed.    . multivitamin (THERAGRAN) per tablet Take 1 tablet by mouth daily.    . NONFORMULARY OR COMPOUNDED ITEM     . Omega-3 Fatty Acids (FISH OIL) 1000 MG CAPS Take 1,000 mg by mouth 2 (two) times daily.    . rosuvastatin (CRESTOR) 20 MG tablet Take 10 mg by mouth daily.    . traZODone (DESYREL) 100 MG tablet Take 200 mg by mouth at bedtime.    . pregabalin (LYRICA) 50 MG capsule TAKE (1) CAPSULE TWICE DAILY. (Patient not taking: Reported on 12/17/2020) 60 capsule 5   No facility-administered medications prior to visit.    PAST MEDICAL HISTORY: Past Medical History:  Diagnosis Date  . Anemia   . Ankle fracture 2022   left  . Aortic heart murmur   . Arthritis   . Celiac disease    Dr Wynetta Emery  . Depression   . Diverticulitis of colon   . Glaucoma   . Glaucoma 2012  . Heart murmur   . History of colonic polyps    3 mm, Dr Wynetta Emery  . Hypertension   . Hypothyroidism   . Kidney stone    stone in left kidney now  . Osteopenia   . Peripheral neuropathy   . Spinal stenosis     PAST SURGICAL HISTORY: Past Surgical History:  Procedure Laterality Date  . APPENDECTOMY  1966   @ tubal  . BREAST CYST ASPIRATION Left 1983  . CATARACT EXTRACTION Bilateral yrs ago  . COLON BIOPSY  1998   Celiac Sprue; Dr Wynetta Emery  .  COLONOSCOPY  2010   Tics& polyp  . COLONOSCOPY WITH PROPOFOL N/A 06/25/2014   Procedure: COLONOSCOPY WITH PROPOFOL;  Surgeon: Garlan Fair, MD;  Location: [facility];  Service: Endoscopy;  Laterality: N/A;  . ESOPHAGOGASTRODUODENOSCOPY (EGD) WITH PROPOFOL N/A 06/25/2014   Procedure: ESOPHAGOGASTRODUODENOSCOPY (EGD) WITH PROPOFOL;  Surgeon: Garlan Fair, MD;  Location: [facility];  Service: Endoscopy;  Laterality: N/A;  . HEMORRHOID SURGERY    . HERNIA REPAIR  1943   Right Side  . INGUINAL HERNIA REPAIR  11/16/2011   Procedure: HERNIA REPAIR INGUINAL ADULT;  Surgeon: Edward Jolly, MD;  Location: [facility];  Service: General;  Laterality: Left;  with mesh  . KNEE ARTHROSCOPY  2007   Left  . NOSE SURGERY  2007   Fractured Nose Reset  . orif broken ankle Left 2013  . POLYPECTOMY  2010  due 2015, 3 mm  . TONSILLECTOMY AND ADENOIDECTOMY  1941  . TUBAL LIGATION  1966    FAMILY HISTORY: Family History  Problem Relation Age of Onset  . Depression Mother        manic  . Heart attack Mother 50  . COPD Mother        heavy smoker  . Coronary artery disease Maternal Uncle        X 6  . Cancer Maternal Uncle        intestinal  . Diabetes Maternal Uncle        X2  . Breast cancer Paternal Aunt 71  . Depression Other        strong maternal hx with mania  . Diverticulitis Other        M uncle & M aunt  . Colon cancer Other        M aunt & M uncle  . Diabetes Maternal Aunt   . Stroke Neg Hx   . Asthma Neg Hx     SOCIAL HISTORY: Social History   Socioeconomic History  . Marital status: Widowed    Spouse name: Not on file  . Number of children: 4  . Years of education: Bachelors +  . Highest education level: Not on file  Occupational History  . Occupation: Retired  Tobacco Use  . Smoking status: Former Smoker    Packs/day: 0.25    Years: 25.00    Pack years: 6.25    Types: Cigarettes    Quit date: 11/01/1984    Years since quitting: 36.1  . Smokeless tobacco:  Never Used  Vaping Use  . Vaping Use: Never used  Substance and Sexual Activity  . Alcohol use: Yes    Comment: whiskey once month  . Drug use: No  . Sexual activity: Never    Birth control/protection: Abstinence  Other Topics Concern  . Not on file  Social History Narrative   Lives at home alone.   Right-handed.   1 coke per day.   Social Determinants of Health   Financial Resource Strain: Not on file  Food Insecurity: Not on file  Transportation Needs: Not on file  Physical Activity: Not on file  Stress: Not on file  Social Connections: Not on file  Intimate Partner Violence: Not on file   PHYSICAL EXAM  Vitals:   12/17/20 0825  BP: 127/76  Pulse: 67  Weight: 134 lb 8 oz (61 kg)  Height: 5' (1.524 m)   Body mass index is 26.27 kg/m.   PHYSICAL EXAMNIATION:  Gen: NAD, conversant, well nourised, well groomed                     Cardiovascular: Regular rate rhythm, no peripheral edema, warm, nontender. Eyes: Conjunctivae clear without exudates or hemorrhage Neck: Supple, no carotid bruits. Pulmonary: Clear to auscultation bilaterally   NEUROLOGICAL EXAM:  MENTAL STATUS: Speech/Cognition: Awake, alert, normal speech, oriented to history taking and casual conversation.  CRANIAL NERVES: CN II: Visual fields are full to confrontation.  Pupils are round equal and briskly reactive to light. CN III, IV, VI: extraocular movement are normal. No ptosis. CN V: Facial sensation is intact to light touch. CN VII: Face is symmetric with normal eye closure and smile. CN VIII: Hearing is normal to casual conversation CN IX, X: Palate elevates symmetrically. Phonation is normal. CN XI: Head turning and shoulder shrug are intact CN XII: Tongue is midline with normal movements and no  atrophy.  MOTOR: Muscle bulk and tone are normal. Muscle strength is normal.  REFLEXES: Reflexes are 1  and symmetric at the biceps, triceps, knees and absent at ankles. Plantar responses  are flexor.  SENSORY: Length dependent decreased light touch pinprick to ankle level  COORDINATION: There is no trunk or limb ataxia.    GAIT/STANCE: Posture is normal. Gait is steady with normal steps, base, arm swing and turning.    DIAGNOSTIC DATA (LABS, IMAGING, TESTING) - I reviewed patient records, labs, notes, testing and imaging myself where available.  Lab Results  Component Value Date   WBC 8.6 11/24/2012   HGB 13.7 11/24/2012   HCT 39.4 11/24/2012   MCV 88.7 11/24/2012   PLT 235 11/24/2012      Component Value Date/Time   NA 138 12/05/2012 1148   K 4.0 12/05/2012 1148   CL 102 12/05/2012 1148   CO2 29 12/05/2012 1148   GLUCOSE 98 12/05/2012 1148   BUN 13 12/05/2012 1148   CREATININE 0.7 12/05/2012 1148   CALCIUM 9.3 12/05/2012 1148   PROT 6.6 04/19/2017 1148   ALBUMIN 4.0 11/24/2012 2047   AST 15 11/24/2012 2047   ALT 15 11/24/2012 2047   ALKPHOS 52 11/24/2012 2047   BILITOT 0.6 11/24/2012 2047   GFRNONAA 86 (L) 11/24/2012 2047   GFRAA >90 11/24/2012 2047   Lab Results  Component Value Date   CHOL 182 12/05/2012   HDL 63.90 12/05/2012   LDLCALC 93 12/05/2012   LDLDIRECT 165.2 09/11/2009   TRIG 125.0 12/05/2012   CHOLHDL 3 12/05/2012   Lab Results  Component Value Date   HGBA1C 5.2 04/19/2017   Lab Results  Component Value Date   IPJASNKN39 767 02/21/2014   Lab Results  Component Value Date   TSH 1.27 02/21/2014    ASSESSMENT AND PLAN 87 y.o. year old female  Small fiber neuropathy -Confirmed by skin biopsy -No treatable etiology found by extensive laboratory evaluation previously -Symptomatic management only, she has tried gabapentin, Cymbalta, Lyrica, nortriptyline, Requip, clonazepam, Keppra -We will keep current dose of gabapentin 300 mg up to 2 tablets 3 times a day, may modify the timing of the dose depend on her symptoms. - I also written diclofenac gel and EMLA gel, [place]  Marcial Pacas, M.D. Ph.D.  [facility] [place], [place] [address] Phone: [phone] Fax:      [phone]

## 2020-12-17 NOTE — Patient Instructions (Addendum)
Metanx one capsule twice a day [place], [place]

## 2020-12-19 ENCOUNTER — Encounter: Payer: Self-pay | Admitting: Neurology

## 2020-12-23 ENCOUNTER — Telehealth: Payer: Self-pay

## 2020-12-23 NOTE — Telephone Encounter (Addendum)
PA denied for diclofenac 1% gel. She was also prescribed EMLA cream that was approved.   I spoke to the patient. She is going to try just the EMLA cream first. If it is not helpful alone, she was informed to call us back. She would be able to get a 100gram tube of diclofenac 1% gel at [facility] for $6.70 w/ a goodrx coupon. We can send in a new rx, if needed.  She verbalized understanding.

## 2020-12-23 NOTE — Telephone Encounter (Signed)
PA for EMLA cream approved through 03/23/2021. Member BU#[id number].  PA denied for diclofenac 1% gel.   I spoke to the patient. She is going to try just the EMLA cream first. If it is not helpful alone, she was informed to call us back. She would be able to get a 100gram tube of diclofenac 1% gel at [facility] for $6.70 w/ a goodrx coupon. We can send in a new rx, if needed.  She verbalized understanding.

## 2020-12-23 NOTE — Telephone Encounter (Signed)
Received a quantity override request from her plan for EMLA cream. The allowable amount is 30 grams per month. Dr. Krista Blue was providing her with a prescription for 120 grams per month, 4 grams QID PRN. Completed and submitted back for review. Decision pending.

## 2020-12-23 NOTE — Telephone Encounter (Signed)
I have submitted a PA request on CMM, Key: [id number]  Your information has been submitted to [place] Medicare Part D. [employer] Part D will review the request and will issue a decision, typically within 1-3 days from your submission.

## 2020-12-23 NOTE — Telephone Encounter (Signed)
Received approval for the quantity override of 120 grams per month. Valid through 10/31/2021.   I called the [facility] and provided update on both prescriptions.

## 2020-12-23 NOTE — Telephone Encounter (Signed)
Received approval for the quantity override of 120 grams per month. Valid through 10/31/2021.   I called the [facility] and provided update on both prescriptions.

## 2020-12-23 NOTE — Telephone Encounter (Signed)
I have submitted a PA request on CMM, Key: [id number]  Your information has been submitted to [place] Medicare Part D. [employer] Part D will review the request and will issue a decision, typically within 1-3 days from your submission.

## 2021-01-13 DIAGNOSIS — S8262XA Displaced fracture of lateral malleolus of left fibula, initial encounter for closed fracture: Secondary | ICD-10-CM | POA: Diagnosis not present

## 2021-01-13 DIAGNOSIS — M25572 Pain in left ankle and joints of left foot: Secondary | ICD-10-CM | POA: Diagnosis not present

## 2021-01-14 ENCOUNTER — Telehealth: Payer: Self-pay | Admitting: Neurology

## 2021-01-14 NOTE — Telephone Encounter (Signed)
Pt is requesting a refill for diclofenac Sodium (VOLTAREN) 1 % GEL .  Pharmacy: [facility]

## 2021-01-14 NOTE — Telephone Encounter (Signed)
150 gram tube x 11 refills sent on 12/17/20. I called the patient to provide her with this information. She will call the pharmacy to let them know she is ready for the next refill.

## 2021-02-12 ENCOUNTER — Ambulatory Visit: Payer: Medicare Other | Admitting: Neurology

## 2021-03-04 DIAGNOSIS — M19071 Primary osteoarthritis, right ankle and foot: Secondary | ICD-10-CM | POA: Diagnosis not present

## 2021-03-04 DIAGNOSIS — S8262XA Displaced fracture of lateral malleolus of left fibula, initial encounter for closed fracture: Secondary | ICD-10-CM | POA: Diagnosis not present

## 2021-04-01 DIAGNOSIS — M25571 Pain in right ankle and joints of right foot: Secondary | ICD-10-CM | POA: Diagnosis not present

## 2021-04-01 DIAGNOSIS — S8262XA Displaced fracture of lateral malleolus of left fibula, initial encounter for closed fracture: Secondary | ICD-10-CM | POA: Diagnosis not present

## 2021-04-01 DIAGNOSIS — M19071 Primary osteoarthritis, right ankle and foot: Secondary | ICD-10-CM | POA: Diagnosis not present

## 2021-04-03 DIAGNOSIS — I10 Essential (primary) hypertension: Secondary | ICD-10-CM | POA: Diagnosis not present

## 2021-04-03 DIAGNOSIS — E538 Deficiency of other specified B group vitamins: Secondary | ICD-10-CM | POA: Diagnosis not present

## 2021-04-03 DIAGNOSIS — Z0000 Encounter for general adult medical examination without abnormal findings: Secondary | ICD-10-CM | POA: Diagnosis not present

## 2021-04-03 DIAGNOSIS — E039 Hypothyroidism, unspecified: Secondary | ICD-10-CM | POA: Diagnosis not present

## 2021-04-07 ENCOUNTER — Other Ambulatory Visit (HOSPITAL_COMMUNITY): Payer: Self-pay | Admitting: Internal Medicine

## 2021-04-07 DIAGNOSIS — R011 Cardiac murmur, unspecified: Secondary | ICD-10-CM | POA: Diagnosis not present

## 2021-04-07 DIAGNOSIS — R3121 Asymptomatic microscopic hematuria: Secondary | ICD-10-CM | POA: Diagnosis not present

## 2021-04-07 DIAGNOSIS — R809 Proteinuria, unspecified: Secondary | ICD-10-CM | POA: Diagnosis not present

## 2021-04-07 DIAGNOSIS — I10 Essential (primary) hypertension: Secondary | ICD-10-CM | POA: Diagnosis not present

## 2021-04-07 DIAGNOSIS — G609 Hereditary and idiopathic neuropathy, unspecified: Secondary | ICD-10-CM | POA: Diagnosis not present

## 2021-04-07 DIAGNOSIS — R0789 Other chest pain: Secondary | ICD-10-CM | POA: Diagnosis not present

## 2021-04-07 DIAGNOSIS — M8580 Other specified disorders of bone density and structure, unspecified site: Secondary | ICD-10-CM | POA: Diagnosis not present

## 2021-04-07 DIAGNOSIS — E538 Deficiency of other specified B group vitamins: Secondary | ICD-10-CM | POA: Diagnosis not present

## 2021-04-07 DIAGNOSIS — E039 Hypothyroidism, unspecified: Secondary | ICD-10-CM | POA: Diagnosis not present

## 2021-04-07 DIAGNOSIS — R8290 Unspecified abnormal findings in urine: Secondary | ICD-10-CM | POA: Diagnosis not present

## 2021-04-07 DIAGNOSIS — Z23 Encounter for immunization: Secondary | ICD-10-CM | POA: Diagnosis not present

## 2021-04-07 DIAGNOSIS — Z0000 Encounter for general adult medical examination without abnormal findings: Secondary | ICD-10-CM | POA: Diagnosis not present

## 2021-04-07 DIAGNOSIS — R82998 Other abnormal findings in urine: Secondary | ICD-10-CM | POA: Diagnosis not present

## 2021-04-16 DIAGNOSIS — M25571 Pain in right ankle and joints of right foot: Secondary | ICD-10-CM | POA: Diagnosis not present

## 2021-04-16 DIAGNOSIS — M25572 Pain in left ankle and joints of left foot: Secondary | ICD-10-CM | POA: Diagnosis not present

## 2021-04-22 DIAGNOSIS — M25571 Pain in right ankle and joints of right foot: Secondary | ICD-10-CM | POA: Diagnosis not present

## 2021-04-22 DIAGNOSIS — M25572 Pain in left ankle and joints of left foot: Secondary | ICD-10-CM | POA: Diagnosis not present

## 2021-04-27 DIAGNOSIS — M25571 Pain in right ankle and joints of right foot: Secondary | ICD-10-CM | POA: Diagnosis not present

## 2021-04-27 DIAGNOSIS — M25572 Pain in left ankle and joints of left foot: Secondary | ICD-10-CM | POA: Diagnosis not present

## 2021-04-29 ENCOUNTER — Other Ambulatory Visit: Payer: Self-pay

## 2021-04-29 ENCOUNTER — Ambulatory Visit (HOSPITAL_COMMUNITY): Payer: Medicare Other | Attending: Internal Medicine

## 2021-04-29 DIAGNOSIS — R011 Cardiac murmur, unspecified: Secondary | ICD-10-CM | POA: Diagnosis not present

## 2021-04-29 LAB — ECHOCARDIOGRAM COMPLETE
Area-P 1/2: 1.61 cm2
S' Lateral: 2.7 cm

## 2021-04-30 DIAGNOSIS — M25571 Pain in right ankle and joints of right foot: Secondary | ICD-10-CM | POA: Diagnosis not present

## 2021-04-30 DIAGNOSIS — M25572 Pain in left ankle and joints of left foot: Secondary | ICD-10-CM | POA: Diagnosis not present

## 2021-05-05 DIAGNOSIS — M25571 Pain in right ankle and joints of right foot: Secondary | ICD-10-CM | POA: Diagnosis not present

## 2021-05-05 DIAGNOSIS — M25572 Pain in left ankle and joints of left foot: Secondary | ICD-10-CM | POA: Diagnosis not present

## 2021-05-07 DIAGNOSIS — M25572 Pain in left ankle and joints of left foot: Secondary | ICD-10-CM | POA: Diagnosis not present

## 2021-05-07 DIAGNOSIS — M25571 Pain in right ankle and joints of right foot: Secondary | ICD-10-CM | POA: Diagnosis not present

## 2021-05-12 DIAGNOSIS — M25571 Pain in right ankle and joints of right foot: Secondary | ICD-10-CM | POA: Diagnosis not present

## 2021-05-12 DIAGNOSIS — M25572 Pain in left ankle and joints of left foot: Secondary | ICD-10-CM | POA: Diagnosis not present

## 2021-05-14 DIAGNOSIS — M25572 Pain in left ankle and joints of left foot: Secondary | ICD-10-CM | POA: Diagnosis not present

## 2021-05-14 DIAGNOSIS — M25571 Pain in right ankle and joints of right foot: Secondary | ICD-10-CM | POA: Diagnosis not present

## 2021-06-17 DIAGNOSIS — M19071 Primary osteoarthritis, right ankle and foot: Secondary | ICD-10-CM | POA: Diagnosis not present

## 2021-06-23 ENCOUNTER — Ambulatory Visit: Payer: Medicare Other | Admitting: Neurology

## 2021-07-28 DIAGNOSIS — M859 Disorder of bone density and structure, unspecified: Secondary | ICD-10-CM | POA: Diagnosis not present

## 2021-07-28 DIAGNOSIS — I10 Essential (primary) hypertension: Secondary | ICD-10-CM | POA: Diagnosis not present

## 2021-07-28 DIAGNOSIS — E039 Hypothyroidism, unspecified: Secondary | ICD-10-CM | POA: Diagnosis not present

## 2021-07-30 DIAGNOSIS — H35033 Hypertensive retinopathy, bilateral: Secondary | ICD-10-CM | POA: Diagnosis not present

## 2021-07-30 DIAGNOSIS — H524 Presbyopia: Secondary | ICD-10-CM | POA: Diagnosis not present

## 2021-07-30 DIAGNOSIS — H04123 Dry eye syndrome of bilateral lacrimal glands: Secondary | ICD-10-CM | POA: Diagnosis not present

## 2021-07-30 DIAGNOSIS — H0102A Squamous blepharitis right eye, upper and lower eyelids: Secondary | ICD-10-CM | POA: Diagnosis not present

## 2021-07-30 DIAGNOSIS — H401132 Primary open-angle glaucoma, bilateral, moderate stage: Secondary | ICD-10-CM | POA: Diagnosis not present

## 2021-07-31 DIAGNOSIS — Z23 Encounter for immunization: Secondary | ICD-10-CM | POA: Diagnosis not present

## 2021-07-31 DIAGNOSIS — G609 Hereditary and idiopathic neuropathy, unspecified: Secondary | ICD-10-CM | POA: Diagnosis not present

## 2021-07-31 DIAGNOSIS — E538 Deficiency of other specified B group vitamins: Secondary | ICD-10-CM | POA: Diagnosis not present

## 2021-07-31 DIAGNOSIS — R011 Cardiac murmur, unspecified: Secondary | ICD-10-CM | POA: Diagnosis not present

## 2021-07-31 DIAGNOSIS — M8580 Other specified disorders of bone density and structure, unspecified site: Secondary | ICD-10-CM | POA: Diagnosis not present

## 2021-07-31 DIAGNOSIS — I517 Cardiomegaly: Secondary | ICD-10-CM | POA: Diagnosis not present

## 2021-07-31 DIAGNOSIS — Z1331 Encounter for screening for depression: Secondary | ICD-10-CM | POA: Diagnosis not present

## 2021-07-31 DIAGNOSIS — I10 Essential (primary) hypertension: Secondary | ICD-10-CM | POA: Diagnosis not present

## 2021-07-31 DIAGNOSIS — I5189 Other ill-defined heart diseases: Secondary | ICD-10-CM | POA: Diagnosis not present

## 2021-07-31 DIAGNOSIS — I77810 Thoracic aortic ectasia: Secondary | ICD-10-CM | POA: Diagnosis not present

## 2021-07-31 DIAGNOSIS — E039 Hypothyroidism, unspecified: Secondary | ICD-10-CM | POA: Diagnosis not present

## 2021-07-31 DIAGNOSIS — R809 Proteinuria, unspecified: Secondary | ICD-10-CM | POA: Diagnosis not present

## 2021-09-16 DIAGNOSIS — Z86010 Personal history of colonic polyps: Secondary | ICD-10-CM | POA: Diagnosis not present

## 2021-09-16 DIAGNOSIS — K900 Celiac disease: Secondary | ICD-10-CM | POA: Diagnosis not present

## 2021-09-16 DIAGNOSIS — E538 Deficiency of other specified B group vitamins: Secondary | ICD-10-CM | POA: Diagnosis not present

## 2021-09-16 DIAGNOSIS — R195 Other fecal abnormalities: Secondary | ICD-10-CM | POA: Diagnosis not present

## 2021-09-17 DIAGNOSIS — H9192 Unspecified hearing loss, left ear: Secondary | ICD-10-CM | POA: Diagnosis not present

## 2021-09-17 DIAGNOSIS — H6122 Impacted cerumen, left ear: Secondary | ICD-10-CM | POA: Diagnosis not present

## 2021-09-30 DIAGNOSIS — Z20822 Contact with and (suspected) exposure to covid-19: Secondary | ICD-10-CM | POA: Diagnosis not present

## 2021-10-08 DIAGNOSIS — M8589 Other specified disorders of bone density and structure, multiple sites: Secondary | ICD-10-CM | POA: Diagnosis not present

## 2021-10-28 DIAGNOSIS — Z20822 Contact with and (suspected) exposure to covid-19: Secondary | ICD-10-CM | POA: Diagnosis not present

## 2021-12-10 ENCOUNTER — Other Ambulatory Visit (HOSPITAL_COMMUNITY): Payer: Self-pay | Admitting: *Deleted

## 2021-12-11 ENCOUNTER — Inpatient Hospital Stay (HOSPITAL_COMMUNITY): Admission: RE | Admit: 2021-12-11 | Payer: Medicare Other | Source: Ambulatory Visit

## 2021-12-14 ENCOUNTER — Other Ambulatory Visit: Payer: Self-pay | Admitting: Neurology

## 2021-12-14 NOTE — Telephone Encounter (Signed)
Rx refilled.

## 2021-12-16 ENCOUNTER — Other Ambulatory Visit (HOSPITAL_COMMUNITY): Payer: Self-pay

## 2021-12-17 ENCOUNTER — Other Ambulatory Visit: Payer: Self-pay

## 2021-12-17 ENCOUNTER — Ambulatory Visit (HOSPITAL_COMMUNITY)
Admission: RE | Admit: 2021-12-17 | Discharge: 2021-12-17 | Disposition: A | Discharge status: Home / Self Care | Payer: Medicare Other | Source: Ambulatory Visit | Attending: Internal Medicine | Admitting: Internal Medicine

## 2021-12-17 DIAGNOSIS — M810 Age-related osteoporosis without current pathological fracture: Secondary | ICD-10-CM | POA: Insufficient documentation

## 2021-12-17 MED ORDER — DENOSUMAB 60 MG/ML ~~LOC~~ SOSY
60.0000 mg | PREFILLED_SYRINGE | Freq: Once | SUBCUTANEOUS | Status: AC
  Administered 2021-12-17: 60 mg via SUBCUTANEOUS

## 2021-12-17 MED ORDER — DENOSUMAB 60 MG/ML ~~LOC~~ SOSY
PREFILLED_SYRINGE | SUBCUTANEOUS | Status: AC
  Filled 2021-12-17: qty 1

## 2022-03-11 ENCOUNTER — Other Ambulatory Visit: Payer: Self-pay | Admitting: Neurology

## 2022-03-11 NOTE — Telephone Encounter (Signed)
Rx refilled.

## 2022-06-02 ENCOUNTER — Other Ambulatory Visit: Payer: Self-pay | Admitting: Neurology

## 2022-06-03 ENCOUNTER — Other Ambulatory Visit: Payer: Self-pay | Admitting: Internal Medicine

## 2022-06-03 DIAGNOSIS — R079 Chest pain, unspecified: Secondary | ICD-10-CM

## 2022-06-08 ENCOUNTER — Other Ambulatory Visit (HOSPITAL_COMMUNITY): Payer: Self-pay | Admitting: *Deleted

## 2022-06-09 ENCOUNTER — Ambulatory Visit
Admission: RE | Admit: 2022-06-09 | Discharge: 2022-06-09 | Disposition: A | Discharge status: Home / Self Care | Payer: Medicare Other | Source: Ambulatory Visit | Attending: Internal Medicine | Admitting: Internal Medicine

## 2022-06-09 ENCOUNTER — Ambulatory Visit (HOSPITAL_COMMUNITY)
Admission: RE | Admit: 2022-06-09 | Discharge: 2022-06-09 | Disposition: A | Discharge status: Home / Self Care | Payer: Medicare Other | Source: Ambulatory Visit | Attending: Internal Medicine | Admitting: Internal Medicine

## 2022-06-09 ENCOUNTER — Telehealth: Payer: Self-pay | Admitting: *Deleted

## 2022-06-09 DIAGNOSIS — M810 Age-related osteoporosis without current pathological fracture: Secondary | ICD-10-CM | POA: Insufficient documentation

## 2022-06-09 DIAGNOSIS — R079 Chest pain, unspecified: Secondary | ICD-10-CM

## 2022-06-09 MED ORDER — DENOSUMAB 60 MG/ML ~~LOC~~ SOSY: 60.0000 mg | PREFILLED_SYRINGE | Freq: Once | SUBCUTANEOUS | Status: AC

## 2022-06-09 MED ORDER — DENOSUMAB 60 MG/ML ~~LOC~~ SOSY
PREFILLED_SYRINGE | SUBCUTANEOUS | Status: AC
  Administered 2022-06-09: 60 mg via SUBCUTANEOUS
  Filled 2022-06-09: qty 1

## 2022-06-09 MED ORDER — METANX 3-90.314-2-35 MG PO CAPS: 1.0000 | ORAL_CAPSULE | Freq: Two times a day (BID) | ORAL | 0 refills | Status: DC

## 2022-06-09 NOTE — Telephone Encounter (Signed)
Pt call request refill on for Metanx please call [phone]

## 2022-06-09 NOTE — Telephone Encounter (Signed)
She is scheduled for a follow up 07/29/22. Refill sent to the pharmacy.

## 2022-06-15 ENCOUNTER — Other Ambulatory Visit: Payer: Self-pay | Admitting: Neurology

## 2022-07-29 ENCOUNTER — Encounter: Payer: Self-pay | Admitting: Neurology

## 2022-07-29 ENCOUNTER — Ambulatory Visit (INDEPENDENT_AMBULATORY_CARE_PROVIDER_SITE_OTHER): Payer: Medicare Other | Admitting: Neurology

## 2022-07-29 VITALS — BP 126/84 | HR 77 | Ht 60.0 in | Wt 145.5 lb

## 2022-07-29 DIAGNOSIS — G629 Polyneuropathy, unspecified: Secondary | ICD-10-CM

## 2022-07-29 DIAGNOSIS — G4700 Insomnia, unspecified: Secondary | ICD-10-CM | POA: Diagnosis not present

## 2022-07-29 MED ORDER — DULOXETINE HCL 60 MG PO CPEP: 60.0000 mg | ORAL_CAPSULE | Freq: Every day | ORAL | 11 refills | Status: DC

## 2022-07-29 NOTE — Progress Notes (Signed)
HISTORY OF PRESENT ILLNESS: Krista Carlson is a 88 years old right-handed female, seen in refer by her primary care doctor Crist Infante for evaluation of peripheral neuropathy, initial evaluation was on November 15 2016.   She had past medical history of hypertension, hypothyroidism, on supplement, 1998 biopsy proven celiac disease, on gluten-free diet, she presented with uncontrolled diarrhea for 6 months, had a history of vitamin B12 deficiency, she is still receiving vitamin B-12 intramuscular injection once a month, hyperlipidemia, kidney stone, left ankle fracture in 2013, status post surgical fixation,    She noticed gradual onset bilateral feet paresthesia around 2005, slowly progressive over the years, now ascending to below bilateral knee level, she described constant numb tingling occasionally achy sensation, especially at nighttime, she could not tell if her heating pad is on and off because of constant burning sensation at her lower extremity. She denies weakness, she denies finger paresthesia, she denies significant low back pain, does has left-sided neck strain,   She also has chronic insomnia, is taking trazodone 150 mg at night p.m., go to sleep around 10 PM, she has never tried neuropathic pain medications, was given a prescription of Lyrica 50 mg twice a day just recently.   Update January 18 2017: Laboratory evaluations, normal creatinine 0.8, WBC 5.1, hemoglobin 14 point 2, LDL 106, normal TSH, free T4, vitamin B12 738,   She has tried Lyrica 50 mg up to 2 tablets every night without helping her symptoms, complains of alternating hot and cold sensation urge to move her feet at nighttime Electrodiagnostic study December 31 2016 showed no large fiber peripheral neuropathy   UPDATE April 19 2017: Skin biopsy did confirm small fiber neuropathy in March 2018   I reviewed extensive laboratory evaluations in March 2018,    EMG nerve conduction study in March 2018 was within normal limits.    Has tried Requip, clonazepam, did not help her symptoms, make her more dizzy, tired at nighttime   Update 01/24/2019 She continue complains of bilateral feet paresthesia despite polypharmacy gabapentin 600 mg 3 times a day, Cymbalta 30 mg twice a day.   She is also taking trazodone 150 mg every night to help her sleep better, she continued B12 injection  UPDATE Dec 17 2020: She is overall doing well other than continue with bilateral feet and leg paresthesia, cold sensation, she take gabapentin 300 mg 2 tablets 3 times a day, during the daytime her symptoms are manageable, but at nighttime, she felt cold pain, has to use heating pad all night long, when she first got up in the morning, she also felt lower extremity cold sensation, she is taking trazodone 100 mg 2 tablets every night for sleep, she sleeps well,  Skin biopsy confirmed Small fiber peripheral neuropathy, laboratory evaluation failed to demonstrate treatable etiology  Previously she has tried Cymbalta, not sure about the benefit, also a short trial of Keppra, Lyrica in the past without significant improvement, She described urge to move her left with prolonged sitting, especially when she tries to go to bed, take trazodone 200 mg every night sleeps well  UPDATE Sept 28 2023: She continues to have bilateral lower extremity paresthesia from mid shin down, especially at nighttime, burning sensation as if heating pad, tolerating Cymbalta 30 mg every morning, gabapentin 300 mg 3 times daily, was not sure it is helping her, she denies significant neck pain, low back pain, denies bowel and bladder incontinence, ambulate without assistant, drive to clinic without any difficulty  She played with her friends bridges every week, have son and daughter check on her regularly  REVIEW OF SYSTEMS: Out of a complete 14 system review of symptoms, the patient complains only of the following symptoms, and all other reviewed systems are  negative.  Tingling, burning  ALLERGIES: Allergies  Allergen Reactions   Nickel     Redness and discomfort ; contact dermatitis skin turns red   Other     Wheat, barley, gluten rye- celiac disease    HOME MEDICATIONS: Outpatient Medications Prior to Visit  Medication Sig Dispense Refill   amLODipine (NORVASC) 5 MG tablet Take 5 mg by mouth every morning.     cycloSPORINE (RESTASIS) 0.05 % ophthalmic emulsion Place 1 drop into both eyes 2 (two) times daily.     diclofenac Sodium (VOLTAREN) 1 % GEL 4 gram qid 150 g 11   DULoxetine (CYMBALTA) 30 MG capsule duloxetine 30 mg capsule,delayed release  TAKE (1) CAPSULE DAILY.     gabapentin (NEURONTIN) 300 MG capsule Take 600 mg by mouth 3 (three) times daily.     L-Methylfolate-Algae-B12-B6 (METANX) 3-90.314-2-35 MG CAPS Take 1 capsule by mouth in the morning and at bedtime. 180 capsule 0   latanoprost (XALATAN) 0.005 % ophthalmic solution Place 1 drop into both eyes at bedtime.     levothyroxine (SYNTHROID, LEVOTHROID) 112 MCG tablet Take 56-112 mcg by mouth as directed.     multivitamin (THERAGRAN) per tablet Take 1 tablet by mouth daily.     NONFORMULARY OR COMPOUNDED ITEM      Omega-3 Fatty Acids (FISH OIL) 1000 MG CAPS Take 1,000 mg by mouth 2 (two) times daily.     rosuvastatin (CRESTOR) 20 MG tablet Take 10 mg by mouth daily.     traZODone (DESYREL) 100 MG tablet Take 200 mg by mouth at bedtime.     alendronate (FOSAMAX) 70 MG tablet Take 1 tablet by mouth once a week. Wednesday     Calcium Carbonate-Vitamin D (CALCIUM + D PO) Take 600 mg by mouth 2 (two) times daily.     Cyanocobalamin 1000 MCG/ML KIT Inject as directed. Every 30 days     Glucosamine-Chondroitin (COSAMIN DS PO) Take 1 tablet by mouth 2 (two) times daily.     lidocaine-prilocaine (EMLA) cream 4 gram qid prn 120 g 11   No facility-administered medications prior to visit.    PAST MEDICAL HISTORY: Past Medical History:  Diagnosis Date   Anemia    Ankle  fracture 2022   left   Aortic heart murmur    Arthritis    Celiac disease    Dr Wynetta Emery   Depression    Diverticulitis of colon    Glaucoma    Glaucoma 2012   Heart murmur    History of colonic polyps    3 mm, Dr Wynetta Emery   Hypertension    Hypothyroidism    Kidney stone    stone in left kidney now   Osteopenia    Peripheral neuropathy    Spinal stenosis     PAST SURGICAL HISTORY: Past Surgical History:  Procedure Laterality Date   APPENDECTOMY  1966   @ tubal   BREAST CYST ASPIRATION Left 1983   CATARACT EXTRACTION Bilateral yrs ago   COLON BIOPSY  1998   Celiac Sprue; Dr Wynetta Emery   COLONOSCOPY  2010   Tics& polyp   COLONOSCOPY WITH PROPOFOL N/A 06/25/2014   Procedure: COLONOSCOPY WITH PROPOFOL;  Surgeon: Garlan Fair, MD;  Location: [facility];  Service: Endoscopy;  Laterality: N/A;   ESOPHAGOGASTRODUODENOSCOPY (EGD) WITH PROPOFOL N/A 06/25/2014   Procedure: ESOPHAGOGASTRODUODENOSCOPY (EGD) WITH PROPOFOL;  Surgeon: Garlan Fair, MD;  Location: [facility];  Service: Endoscopy;  Laterality: N/A;   HEMORRHOID SURGERY     HERNIA REPAIR  1943   Right Side   INGUINAL HERNIA REPAIR  11/16/2011   Procedure: HERNIA REPAIR INGUINAL ADULT;  Surgeon: Edward Jolly, MD;  Location: [facility];  Service: General;  Laterality: Left;  with mesh   KNEE ARTHROSCOPY  2007   Left   NOSE SURGERY  2007   Fractured Nose Reset   orif broken ankle Left 2013   POLYPECTOMY  2010   due 2015, 3 mm   TONSILLECTOMY AND ADENOIDECTOMY  1941   TUBAL LIGATION  1966    FAMILY HISTORY: Family History  Problem Relation Age of Onset   Depression Mother        manic   Heart attack Mother 13   COPD Mother        heavy smoker   Coronary artery disease Maternal Uncle        X 6   Cancer Maternal Uncle        intestinal   Diabetes Maternal Uncle        X2   Breast cancer Paternal Aunt 36   Depression Other        strong maternal hx with mania   Diverticulitis Other        M uncle  & M aunt   Colon cancer Other        M aunt & M uncle   Diabetes Maternal Aunt    Stroke Neg Hx    Asthma Neg Hx     SOCIAL HISTORY: Social History   Socioeconomic History   Marital status: Widowed    Spouse name: Not on file   Number of children: 4   Years of education: Bachelors +   Highest education level: Not on file  Occupational History   Occupation: Retired  Tobacco Use   Smoking status: Former    Packs/day: 0.25    Years: 25.00    Total pack years: 6.25    Types: Cigarettes    Quit date: 11/01/1984    Years since quitting: 37.7   Smokeless tobacco: Never  Vaping Use   Vaping Use: Never used  Substance and Sexual Activity   Alcohol use: Yes    Comment: whiskey once month   Drug use: No   Sexual activity: Never    Birth control/protection: Abstinence  Other Topics Concern   Not on file  Social History Narrative   Lives at home alone.   Right-handed.   1 coke per day.   Social Determinants of Health   Financial Resource Strain: Not on file  Food Insecurity: Not on file  Transportation Needs: Not on file  Physical Activity: Not on file  Stress: Not on file  Social Connections: Not on file  Intimate Partner Violence: Not on file   PHYSICAL EXAM  Vitals:   07/29/22 1249  BP: 126/84  Pulse: 77  Weight: 145 lb 8 oz (66 kg)  Height: 5' (1.524 m)   Body mass index is 28.42 kg/m.   PHYSICAL EXAMNIATION:  Gen: NAD, conversant, well nourised, well groomed                     Cardiovascular: Regular rate rhythm, no peripheral edema, warm, nontender. Eyes: Conjunctivae clear without exudates  or hemorrhage Neck: Supple, no carotid bruits. Pulmonary: Clear to auscultation bilaterally   NEUROLOGICAL EXAM:  MENTAL STATUS: Speech/Cognition: Awake, alert, normal speech, oriented to history taking and casual conversation.  CRANIAL NERVES: CN II: Visual fields are full to confrontation.  Pupils are round equal and briskly reactive to light. CN III, IV,  VI: extraocular movement are normal. No ptosis. CN V: Facial sensation is intact to light touch. CN VII: Face is symmetric with normal eye closure and smile. CN VIII: Hearing is normal to casual conversation CN IX, X: Palate elevates symmetrically. Phonation is normal. CN XI: Head turning and shoulder shrug are intact CN XII: Tongue is midline with normal movements and no atrophy.  MOTOR: Muscle bulk and tone are normal. Muscle strength is normal.  REFLEXES: Reflexes are 1  and symmetric at the biceps, triceps, knees and absent at ankles. Plantar responses are flexor.  SENSORY: Length dependent decreased light touch pinprick to distal shin level COORDINATION: There is no trunk or limb ataxia.    GAIT/STANCE: Able to get up from seated position arm crossed, scoliosis, steady   DIAGNOSTIC DATA (LABS, IMAGING, TESTING) - I reviewed patient records, labs, notes, testing and imaging myself where available.  ASSESSMENT AND PLAN 88 y.o. year old female  Small fiber neuropathy Chronic Insomnia -Confirmed by skin biopsy in 2022 No treatable etiology found, very slow progression, patient is still highly functional, -Symptomatic management only, she has tried gabapentin, Cymbalta, Lyrica, nortriptyline, Requip, clonazepam, Keppra Now taking Cymbalta 30 mg, was not sure about the benefit of Gabapentin 300 mg 3 times daily, I have suggested for higher dose of Cymbalta 60 mg daily, save gabapentin to before bedtime, she is now taking trazodone 200 mg when she wake up 1 AM early morning, then go back to sleep, if she can move gabapentin to bedtime, she may be able to taper down trazodone,  Continue follow-up with her primary care, only return to clinic for new issues   Marcial Pacas, M.D. Ph.D.  [facility] [place], [place] [address] Phone: [phone] Fax:      [phone]

## 2022-09-15 ENCOUNTER — Telehealth: Payer: Self-pay | Admitting: Neurology

## 2022-09-15 MED ORDER — METANX 3-90.314-2-35 MG PO CAPS: 1.0000 | ORAL_CAPSULE | Freq: Two times a day (BID) | ORAL | 0 refills | Status: DC

## 2022-09-15 NOTE — Telephone Encounter (Signed)
Rx sent 

## 2022-09-15 NOTE — Telephone Encounter (Signed)
Pt is calling. Requesting a refill on medication  L-Methylfolate-Algae-B12-B6 (METANX) 3-90.314-2-35 MG CAPS. Refill should be sent to [facility]

## 2022-12-15 ENCOUNTER — Other Ambulatory Visit: Payer: Self-pay | Admitting: Neurology

## 2023-07-21 ENCOUNTER — Other Ambulatory Visit: Payer: Self-pay | Admitting: Neurology

## 2023-07-25 ENCOUNTER — Other Ambulatory Visit (HOSPITAL_BASED_OUTPATIENT_CLINIC_OR_DEPARTMENT_OTHER): Payer: Self-pay

## 2023-07-25 MED ORDER — PREVNAR 20 0.5 ML IM SUSY
0.5000 mL | PREFILLED_SYRINGE | Freq: Once | INTRAMUSCULAR | 0 refills | Status: AC
  Filled 2023-07-25: qty 0.5, 1d supply, fill #0

## 2023-07-25 MED ORDER — PNEUMOVAX 23 25 MCG/0.5ML IJ INJ
0.5000 mL | INJECTION | Freq: Once | INTRAMUSCULAR | 0 refills | Status: AC
  Filled 2023-07-25: qty 0.5, 1d supply, fill #0

## 2023-08-04 ENCOUNTER — Other Ambulatory Visit (HOSPITAL_BASED_OUTPATIENT_CLINIC_OR_DEPARTMENT_OTHER): Payer: Self-pay

## 2023-08-04 MED ORDER — AREXVY 120 MCG/0.5ML IM SUSR
0.5000 mL | Freq: Once | INTRAMUSCULAR | 0 refills | Status: AC
  Filled 2023-08-04: qty 0.5, 1d supply, fill #0

## 2023-08-08 ENCOUNTER — Other Ambulatory Visit (HOSPITAL_COMMUNITY): Payer: Self-pay | Admitting: *Deleted

## 2023-08-10 ENCOUNTER — Ambulatory Visit (HOSPITAL_COMMUNITY)
Admission: RE | Admit: 2023-08-10 | Discharge: 2023-08-10 | Disposition: A | Discharge status: Home / Self Care | Payer: Medicare Other | Source: Ambulatory Visit | Attending: Internal Medicine | Admitting: Internal Medicine

## 2023-08-10 DIAGNOSIS — M810 Age-related osteoporosis without current pathological fracture: Secondary | ICD-10-CM | POA: Insufficient documentation

## 2023-08-10 MED ORDER — DENOSUMAB 60 MG/ML ~~LOC~~ SOSY
60.0000 mg | PREFILLED_SYRINGE | Freq: Once | SUBCUTANEOUS | Status: AC
  Administered 2023-08-10: 60 mg via SUBCUTANEOUS

## 2023-08-10 MED ORDER — DENOSUMAB 60 MG/ML ~~LOC~~ SOSY
PREFILLED_SYRINGE | SUBCUTANEOUS | Status: AC
  Filled 2023-08-10: qty 1

## 2023-10-01 ENCOUNTER — Other Ambulatory Visit: Payer: Self-pay | Admitting: Neurology

## 2023-10-04 ENCOUNTER — Telehealth: Payer: Self-pay | Admitting: Neurology

## 2023-10-04 MED ORDER — DULOXETINE HCL 60 MG PO CPEP: 60.0000 mg | ORAL_CAPSULE | Freq: Every day | ORAL | 1 refills | Status: DC

## 2023-10-04 NOTE — Telephone Encounter (Signed)
Pt called and scheduled an appt with Dr. Terrace Arabia for first avail of 01/12/24. She is asking if her refill for DULoxetine (CYMBALTA) 60 MG capsule [[id number]] can be sent to the pharmacy in the meantime.

## 2023-10-04 NOTE — Addendum Note (Signed)
Addended by: Lenn Cal on: 10/04/2023 09:47 AM   Modules accepted: Orders

## 2023-11-10 ENCOUNTER — Other Ambulatory Visit: Payer: Self-pay | Admitting: Neurology

## 2023-11-18 ENCOUNTER — Other Ambulatory Visit: Payer: Self-pay | Admitting: Anesthesiology

## 2023-11-18 ENCOUNTER — Other Ambulatory Visit: Payer: Self-pay | Admitting: Neurology

## 2024-01-12 ENCOUNTER — Ambulatory Visit: Payer: Medicare Other | Admitting: Neurology

## 2024-01-28 ENCOUNTER — Other Ambulatory Visit: Payer: Self-pay | Admitting: Neurology

## 2024-02-03 ENCOUNTER — Other Ambulatory Visit: Payer: Self-pay | Admitting: Neurology

## 2024-03-06 ENCOUNTER — Encounter: Payer: Self-pay | Admitting: Neurology

## 2024-03-06 ENCOUNTER — Ambulatory Visit (INDEPENDENT_AMBULATORY_CARE_PROVIDER_SITE_OTHER): Payer: Medicare Other | Admitting: Neurology

## 2024-03-06 VITALS — BP 123/82 | HR 92 | Ht 60.0 in | Wt 160.5 lb

## 2024-03-06 DIAGNOSIS — R269 Unspecified abnormalities of gait and mobility: Secondary | ICD-10-CM | POA: Diagnosis not present

## 2024-03-06 DIAGNOSIS — G2581 Restless legs syndrome: Secondary | ICD-10-CM | POA: Diagnosis not present

## 2024-03-06 DIAGNOSIS — G629 Polyneuropathy, unspecified: Secondary | ICD-10-CM | POA: Diagnosis not present

## 2024-03-06 DIAGNOSIS — G4700 Insomnia, unspecified: Secondary | ICD-10-CM | POA: Diagnosis not present

## 2024-03-06 MED ORDER — NEUPRO 1 MG/24HR TD PT24: 1.0000 mg | MEDICATED_PATCH | Freq: Every day | TRANSDERMAL | 3 refills | Status: AC

## 2024-03-06 NOTE — Progress Notes (Signed)
 ASSESSMENT AND PLAN 89 y.o. year old female  Small fiber neuropathy Chronic Insomnia Restless leg syndrome -Confirmed by skin biopsy in 2022 No treatable etiology found, very slow progression, patient is still highly functional, -Symptomatic management only, she has tried gabapentin , Cymbalta , Lyrica , nortriptyline, Requip , clonazepam , Keppra  Now taking Cymbalta  60 mg, gabapentin  300 mg 2 tablets 3 times a day, remains symptomatic, taking trazodone  100 mg 2 tablets in the middle of the night to catch extra sleep Will try Neupro patch 1 mg daily, if she can tolerate a titrating to higher dose, other options are lamotrigine, even low-dose oxycodone  at nighttime   DIAGNOSTIC DATA (LABS, IMAGING, TESTING) - I reviewed patient records, labs, notes, testing and imaging myself where available.  HISTORY OF PRESENT ILLNESS: Krista Carlson is a 89 years old right-handed female, seen in refer by her primary care doctor Aldo Hun for evaluation of peripheral neuropathy, initial evaluation was on November 15 2016.   She had past medical history of hypertension, hypothyroidism, on supplement, 1998 biopsy proven celiac disease, on gluten-free diet, she presented with uncontrolled diarrhea for 6 months, had a history of vitamin B12 deficiency, she is still receiving vitamin B-12 intramuscular injection once a month, hyperlipidemia, kidney stone, left ankle fracture in 2013, status post surgical fixation,    She noticed gradual onset bilateral feet paresthesia around 2005, slowly progressive over the years, now ascending to below bilateral knee level, she described constant numb tingling occasionally achy sensation, especially at nighttime, she could not tell if her heating pad is on and off because of constant burning sensation at her lower extremity. She denies weakness, she denies finger paresthesia, she denies significant low back pain, does has left-sided neck strain,   She also has chronic insomnia, is  taking trazodone  150 mg at night p.m., go to sleep around 10 PM, she has never tried neuropathic pain medications, was given a prescription of Lyrica  50 mg twice a day just recently.   Update January 18 2017: Laboratory evaluations, normal creatinine 0.8, WBC 5.1, hemoglobin 14 point 2, LDL 106, normal TSH, free T4, vitamin B12 738,   She has tried Lyrica  50 mg up to 2 tablets every night without helping her symptoms, complains of alternating hot and cold sensation urge to move her feet at nighttime Electrodiagnostic study December 31 2016 showed no large fiber peripheral neuropathy   UPDATE April 19 2017: Skin biopsy did confirm small fiber neuropathy in March 2018   I reviewed extensive laboratory evaluations in March 2018,    EMG nerve conduction study in March 2018 was within normal limits.   Has tried Requip , clonazepam , did not help her symptoms, make her more dizzy, tired at nighttime   Update 01/24/2019 She continue complains of bilateral feet paresthesia despite polypharmacy gabapentin  600 mg 3 times a day, Cymbalta  30 mg twice a day.   She is also taking trazodone  150 mg every night to help her sleep better, she continued B12 injection  UPDATE Dec 17 2020: She is overall doing well other than continue with bilateral feet and leg paresthesia, cold sensation, she take gabapentin  300 mg 2 tablets 3 times a day, during the daytime her symptoms are manageable, but at nighttime, she felt cold pain, has to use heating pad all night long, when she first got up in the morning, she also felt lower extremity cold sensation, she is taking trazodone  100 mg 2 tablets every night for sleep, she sleeps well,  Skin biopsy confirmed Small fiber  peripheral neuropathy, laboratory evaluation failed to demonstrate treatable etiology  Previously she has tried Cymbalta , not sure about the benefit, also a short trial of Keppra , Lyrica  in the past without significant improvement, She described urge to move her  left with prolonged sitting, especially when she tries to go to bed, take trazodone  200 mg every night sleeps well  UPDATE Sept 28 2023: She continues to have bilateral lower extremity paresthesia from mid shin down, especially at nighttime, burning sensation as if heating pad, tolerating Cymbalta  30 mg every morning, gabapentin  300 mg 3 times daily, was not sure it is helping her, she denies significant neck pain, low back pain, denies bowel and bladder incontinence, ambulate without assistant, drive to clinic without any difficulty  She played with her friends bridges every week, have son and daughter check on her regularly  UPDATE May 6th 2025: She lost follow-up since last visit in September, 2023, now on higher dose of Cymbalta  60 mg daily, gabapentin  300 mg 2 tablets 3 times a day, remains symptomatic, doing well during the day especially if she is distracted, such as walking, playing bridges, do feel better after walk, but at nighttime before she goes to sleep, she feel uncomfortable, deep achy pain below knee level, feet feel hot and cold, she goes to sleep around 9:30 PM after gabapentin , often wake up few hours later, take her trazodone  100 mg 2 tablets to catch extra few hours of sleep  PHYSICAL EXAM  Vitals:   03/06/24 1438  BP: 123/82  Pulse: 92  Weight: 160 lb 8 oz (72.8 kg)  Height: 5' (1.524 m)   Body mass index is 31.35 kg/m.   PHYSICAL EXAMNIATION:  Gen: NAD, conversant, well nourised, well groomed                     Cardiovascular: Regular rate rhythm, no peripheral edema, warm, nontender. Eyes: Conjunctivae clear without exudates or hemorrhage Neck: Supple, no carotid bruits. Pulmonary: Clear to auscultation bilaterally   NEUROLOGICAL EXAM:  MENTAL STATUS: Speech/Cognition: Awake, alert, normal speech, oriented to history taking and casual conversation.  CRANIAL NERVES: CN II: Visual fields are full to confrontation.  Pupils are round equal and briskly  reactive to light. CN III, IV, VI: extraocular movement are normal. No ptosis. CN V: Facial sensation is intact to light touch. CN VII: Face is symmetric with normal eye closure and smile. CN VIII: Hearing is normal to casual conversation CN IX, X: Palate elevates symmetrically. Phonation is normal. CN XI: Head turning and shoulder shrug are intact CN XII: Tongue is midline with normal movements and no atrophy.  MOTOR: Muscle bulk and tone are normal. Muscle strength is normal.  REFLEXES: Reflexes are 1  and symmetric at the biceps, triceps, knees and absent at ankles. Plantar responses are flexor.  SENSORY: Mild length-dependent decreased light touch pinprick vibratory sensation to ankle level  COORDINATION: There is no trunk or limb ataxia.    GAIT/STANCE: Able to get up from seated position arm crossed, scoliosis, steady   REVIEW OF SYSTEMS: Out of a complete 14 system review of symptoms, the patient complains only of the following symptoms, and all other reviewed systems are negative.  Tingling, burning  ALLERGIES: Allergies  Allergen Reactions   Nickel Rash    Redness and discomfort ; contact dermatitis skin turns red  Other Reaction(s): Not available   Other     Wheat, barley, gluten rye- celiac disease   Amoxicillin-Pot Clavulanate Diarrhea, Nausea And  Vomiting and Rash    HOME MEDICATIONS: Outpatient Medications Prior to Visit  Medication Sig Dispense Refill   amLODipine  (NORVASC ) 5 MG tablet Take 5 mg by mouth every morning.     cycloSPORINE (RESTASIS) 0.05 % ophthalmic emulsion Place 1 drop into both eyes 2 (two) times daily.     diclofenac  Sodium (VOLTAREN ) 1 % GEL 4 gram qid 150 g 11   DULoxetine  (CYMBALTA ) 60 MG capsule Take 1 capsule (60 mg total) by mouth daily. need office visit for future refills 30 capsule 1   gabapentin  (NEURONTIN ) 100 MG capsule Take 100 mg by mouth 3 (three) times daily.     L-Methylfolate-Algae-B12-B6 (METANX) 3-90.314-2-35 MG CAPS  TAKE 1 CAPSULE BY MOUTH IN THE MORNING AND TAKE 1 CAPSULE BY MOUTH AT BEDTIME 180 capsule 0   L-Methylfolate-B6-B12 (METANX FC) 3-35-2 MG CAPS TAKE 1 CAPSULE BY MOUTH IN THE MORNING AND 1 CAPSULE AT BEDTIME 180 capsule 0   latanoprost (XALATAN) 0.005 % ophthalmic solution Place 1 drop into both eyes at bedtime.     levothyroxine  (SYNTHROID , LEVOTHROID) 112 MCG tablet Take 56-112 mcg by mouth as directed.     multivitamin (THERAGRAN) per tablet Take 1 tablet by mouth daily.     NONFORMULARY OR COMPOUNDED ITEM      Omega-3 Fatty Acids (FISH OIL) 1000 MG CAPS Take 1,000 mg by mouth 2 (two) times daily.     rosuvastatin  (CRESTOR ) 20 MG tablet Take 10 mg by mouth daily.     traZODone  (DESYREL ) 100 MG tablet Take 200 mg by mouth at bedtime.     gabapentin  (NEURONTIN ) 300 MG capsule Take 600 mg by mouth 3 (three) times daily.     No facility-administered medications prior to visit.    PAST MEDICAL HISTORY: Past Medical History:  Diagnosis Date   Anemia    Ankle fracture 2022   left   Aortic heart murmur    Arthritis    Celiac disease    Dr Lincoln Renshaw   Depression    Diverticulitis of colon    Glaucoma    Glaucoma 2012   Heart murmur    History of colonic polyps    3 mm, Dr Lincoln Renshaw   Hypertension    Hypothyroidism    Kidney stone    stone in left kidney now   Osteopenia    Peripheral neuropathy    Spinal stenosis     PAST SURGICAL HISTORY: Past Surgical History:  Procedure Laterality Date   APPENDECTOMY  1966   @ tubal   BREAST CYST ASPIRATION Left 1983   CATARACT EXTRACTION Bilateral yrs ago   COLON BIOPSY  1998   Celiac Sprue; Dr Lincoln Renshaw   COLONOSCOPY  2010   Tics& polyp   COLONOSCOPY WITH PROPOFOL  N/A 06/25/2014   Procedure: COLONOSCOPY WITH PROPOFOL ;  Surgeon: Garrett Kallman, MD;  Location: [facility];  Service: Endoscopy;  Laterality: N/A;   ESOPHAGOGASTRODUODENOSCOPY (EGD) WITH PROPOFOL  N/A 06/25/2014   Procedure: ESOPHAGOGASTRODUODENOSCOPY (EGD) WITH PROPOFOL ;   Surgeon: Garrett Kallman, MD;  Location: [facility];  Service: Endoscopy;  Laterality: N/A;   HEMORRHOID SURGERY     HERNIA REPAIR  1943   Right Side   INGUINAL HERNIA REPAIR  11/16/2011   Procedure: HERNIA REPAIR INGUINAL ADULT;  Surgeon: Quitman Bucy, MD;  Location: [facility];  Service: General;  Laterality: Left;  with mesh   KNEE ARTHROSCOPY  2007   Left   NOSE SURGERY  2007   Fractured Nose Reset  orif broken ankle Left 2013   POLYPECTOMY  2010   due 2015, 3 mm   TONSILLECTOMY AND ADENOIDECTOMY  1941   TUBAL LIGATION  1966    FAMILY HISTORY: Family History  Problem Relation Age of Onset   Depression Mother        manic   Heart attack Mother 30   COPD Mother        heavy smoker   Coronary artery disease Maternal Uncle        X 6   Cancer Maternal Uncle        intestinal   Diabetes Maternal Uncle        X2   Breast cancer Paternal Aunt 59   Depression Other        strong maternal hx with mania   Diverticulitis Other        M uncle & M aunt   Colon cancer Other        M aunt & M uncle   Diabetes Maternal Aunt    Stroke Neg Hx    Asthma Neg Hx     SOCIAL HISTORY: Social History   Socioeconomic History   Marital status: Widowed    Spouse name: Not on file   Number of children: 4   Years of education: Bachelors +   Highest education level: Not on file  Occupational History   Occupation: Retired  Tobacco Use   Smoking status: Former    Current packs/day: 0.00    Average packs/day: 0.3 packs/day for 25.0 years (6.3 ttl pk-yrs)    Types: Cigarettes    Start date: 11/02/1959    Quit date: 11/01/1984    Years since quitting: 39.3   Smokeless tobacco: Never  Vaping Use   Vaping status: Never Used  Substance and Sexual Activity   Alcohol  use: Yes    Comment: whiskey once month   Drug use: No   Sexual activity: Never    Birth control/protection: Abstinence  Other Topics Concern   Not on file  Social History Narrative   Lives at home alone.    Right-handed.   1 coke per day.   Social Drivers of Corporate investment banker Strain: Not on file  Food Insecurity: Not on file  Transportation Needs: Not on file  Physical Activity: Not on file  Stress: Not on file  Social Connections: Not on file  Intimate Partner Violence: Not on file     Phebe Brasil, M.D. Ph.D.  [facility] [address] [place], [place] [address] Phone: [phone] Fax:      [phone]

## 2024-03-27 ENCOUNTER — Other Ambulatory Visit: Payer: Self-pay | Admitting: Neurology

## 2024-04-09 NOTE — Therapy (Signed)
 OUTPATIENT PHYSICAL THERAPY NEURO EVALUATION   Patient Name: Krista Carlson MRN: [id number] DOB:[birth year], 89 y.o., female Today's Date: 04/09/2024   PCP: Aldo Hun, MD  REFERRING PROVIDER: Phebe Brasil, MD  END OF SESSION:   Past Medical History:  Diagnosis Date   Anemia    Ankle fracture 2022   left   Aortic heart murmur    Arthritis    Celiac disease    Dr Lincoln Renshaw   Depression    Diverticulitis of colon    Glaucoma    Glaucoma 2012   Heart murmur    History of colonic polyps    3 mm, Dr Lincoln Renshaw   Hypertension    Hypothyroidism    Kidney stone    stone in left kidney now   Osteopenia    Peripheral neuropathy    Spinal stenosis    Past Surgical History:  Procedure Laterality Date   APPENDECTOMY  1966   @ tubal   BREAST CYST ASPIRATION Left 1983   CATARACT EXTRACTION Bilateral yrs ago   COLON BIOPSY  1998   Celiac Sprue; Dr Lincoln Renshaw   COLONOSCOPY  2010   Tics& polyp   COLONOSCOPY WITH PROPOFOL  N/A 06/25/2014   Procedure: COLONOSCOPY WITH PROPOFOL ;  Surgeon: Garrett Kallman, MD;  Location: [facility];  Service: Endoscopy;  Laterality: N/A;   ESOPHAGOGASTRODUODENOSCOPY (EGD) WITH PROPOFOL  N/A 06/25/2014   Procedure: ESOPHAGOGASTRODUODENOSCOPY (EGD) WITH PROPOFOL ;  Surgeon: Garrett Kallman, MD;  Location: [facility];  Service: Endoscopy;  Laterality: N/A;   HEMORRHOID SURGERY     HERNIA REPAIR  1943   Right Side   INGUINAL HERNIA REPAIR  11/16/2011   Procedure: HERNIA REPAIR INGUINAL ADULT;  Surgeon: Quitman Bucy, MD;  Location: [facility];  Service: General;  Laterality: Left;  with mesh   KNEE ARTHROSCOPY  2007   Left   NOSE SURGERY  2007   Fractured Nose Reset   orif broken ankle Left 2013   POLYPECTOMY  2010   due 2015, 3 mm   TONSILLECTOMY AND ADENOIDECTOMY  1941   TUBAL LIGATION  1966   Patient Active Problem List   Diagnosis Date Noted   Gait abnormality 03/06/2024   Insomnia 07/29/2022   Small fiber neuropathy 07/29/2022   Restless leg  syndrome 12/17/2020   Restless leg 01/18/2017   Paresthesia 11/15/2016   Neck pain 11/15/2016   Cough 07/28/2013   Bilateral dry eyes 06/01/2013   Seroma complicating a procedure, left groin after LIH repair 11/30/2011   Personal history of renal calculi 03/01/2011   Peripheral neuropathy 02/08/2011   Hypothyroidism 09/18/2009   History of colonic polyps 09/08/2009   SPINAL STENOSIS OF LUMBAR REGION 08/09/2008   DIVERTICULOSIS, COLON 07/31/2008   LUMBAR RADICULOPATHY 07/31/2008   ARTHRALGIA 04/29/2008   HYPERLIPIDEMIA 12/01/2007   ANXIETY STATE NOS 06/08/2007   HYPERTENSION, ESSENTIAL NOS 06/08/2007   Vitamin B12 deficiency 01/26/2007   GLUTEN ENTEROPATHY 01/26/2007   OSTEOPENIA 01/26/2007   CARDIAC MURMUR, AORTIC 01/26/2007    ONSET DATE: ***  REFERRING DIAG: G62.9 (ICD-10-CM) - Small fiber neuropathy G47.00 (ICD-10-CM) - Insomnia, unspecified type G25.81 (ICD-10-CM) - Restless leg syndrome R26.9 (ICD-10-CM) - Gait abnormality  THERAPY DIAG:  No diagnosis found.  Rationale for Evaluation and Treatment: Rehabilitation  SUBJECTIVE:  SUBJECTIVE STATEMENT: *** Pt accompanied by: {accompnied:27141}  PERTINENT HISTORY: Anemia, ankle fx, depression, HTN, peripheral neuropathy, L ankle ORIF  PAIN:  Are you having pain? {OPRCPAIN:27236}  PRECAUTIONS: {Therapy precautions:24002}  RED FLAGS: {PT Red Flags:29287}   WEIGHT BEARING RESTRICTIONS: {Yes ***/No:24003}  FALLS: Has patient fallen in last 6 months? {fallsyesno:27318}  LIVING ENVIRONMENT: Lives with: {OPRC lives with:25569::"lives with their family"} Lives in: {Lives in:25570} Stairs: {opstairs:27293} Has following equipment at home: {Assistive devices:23999}  PLOF: {PLOF:24004}  PATIENT GOALS: ***  OBJECTIVE:  Note:  Objective measures were completed at Evaluation unless otherwise noted.  DIAGNOSTIC FINDINGS: none recent  COGNITION: Overall cognitive status: {cognition:24006}   SENSATION: {sensation:27233}  COORDINATION: Alternating pronation/supination: *** Alternating toe tap: *** Finger to nose: *** Heel to shin: ***    EDEMA:  {edema:24020}  MUSCLE TONE: {LE tone:25568}  POSTURE: {posture:25561}  LOWER EXTREMITY ROM:     Active  Right Eval Left Eval  Hip flexion    Hip extension    Hip abduction    Hip adduction    Hip internal rotation    Hip external rotation    Knee flexion    Knee extension    Ankle dorsiflexion    Ankle plantarflexion    Ankle inversion    Ankle eversion     (Blank rows = not tested)  LOWER EXTREMITY MMT:    MMT Right Eval Left Eval  Hip flexion    Hip extension    Hip abduction    Hip adduction    Hip internal rotation    Hip external rotation    Knee flexion    Knee extension    Ankle dorsiflexion    Ankle plantarflexion    Ankle inversion    Ankle eversion    (Blank rows = not tested) GAIT: Findings: Assistive device utilized:{Assistive devices:23999}, Level of assistance: {Levels of assistance:24026}, and Comments: ***  FUNCTIONAL TESTS:  {Functional tests:24029}  PATIENT SURVEYS:  {rehab surveys:24030}                                                                                                                              TREATMENT DATE: ***    PATIENT EDUCATION: Education details: *** Person educated: {Person educated:25204} Education method: {Education Method:25205} Education comprehension: {Education Comprehension:25206}  HOME EXERCISE PROGRAM: ***  GOALS: Goals reviewed with patient? Yes  SHORT TERM GOALS: Target date: {follow up:25551}  Patient to be independent with initial HEP. Baseline: HEP initiated Goal status: {GOALSTATUS:25110}    LONG TERM GOALS: Target date: {follow up:25551}  Patient to  be independent with advanced HEP. Baseline: Not yet initiated  Goal status: {GOALSTATUS:25110}  Patient to demonstrate B LE strength >/=4+/5.  Baseline: See above Goal status: {GOALSTATUS:25110}  Patient to demonstrate *** ROM WFL and without pain limiting.  Baseline: *** Goal status: {GOALSTATUS:25110}  Patient to report and demonstrate improved head, neck, and shoulder posture at rest and with activity.  Baseline: *** Goal status: {GOALSTATUS:25110}  Patient to demonstrate alternating reciprocal pattern when ascending and descending stairs with good stability and 1 handrail as needed.   Baseline: Unable Goal status: {GOALSTATUS:25110}  Patient to score at least 20/24 on DGI in order to decrease risk of falls.  Baseline: *** Goal status: {GOALSTATUS:25110}  Patient to complete TUG in <14 sec with LRAD in order to decrease risk of falls.   Baseline: *** Goal status: {GOALSTATUS:25110}  Patient to demonstrate 5xSTS test in <15 sec in order to decrease risk of falls.  Baseline: *** Goal status: {GOALSTATUS:25110}  Patient to score at least ***/56 on Berg in order to decrease risk of falls.  Baseline: *** Goal status: {GOALSTATUS:25110}  Patient to score at least *** on FOTO in order to indicate improved functional outcomes.  Baseline: *** Goal status: {GOALSTATUS:25110}  ASSESSMENT:  CLINICAL IMPRESSION:  Patient is an 89 y/o F presenting to OPP[facility] with c/o *** for the past ***  Patient today presenting with ***.    Patient was educated on gentle *** HEP and reported understanding. Prior to current episode, patient was independent. Would benefit from skilled PT services *** x/week for *** weeks to address aforementioned impairments in order to optimize level of function.    OBJECTIVE IMPAIRMENTS: {opptimpairments:25111}.   ACTIVITY LIMITATIONS: {activitylimitations:27494}  PARTICIPATION LIMITATIONS: {participationrestrictions:25113}  PERSONAL FACTORS: {Personal  factors:25162} are also affecting patient's functional outcome.   REHAB POTENTIAL: {rehabpotential:25112}  CLINICAL DECISION MAKING: {clinical decision making:25114}  EVALUATION COMPLEXITY: {Evaluation complexity:25115}  PLAN:  PT FREQUENCY: {rehab frequency:25116}  PT DURATION: {rehab duration:25117}  PLANNED INTERVENTIONS: {rehab planned interventions:25118::"97110-Therapeutic exercises","97530- Therapeutic [phone]- Neuromuscular re-education","97535- Self NGEX","52841- Manual therapy"}  PLAN FOR NEXT SESSION: Arlene Lacy, PT 04/09/2024, 12:32 PM

## 2024-04-10 ENCOUNTER — Ambulatory Visit: Attending: Neurology | Admitting: Physical Therapy

## 2024-04-10 ENCOUNTER — Other Ambulatory Visit: Payer: Self-pay

## 2024-04-10 ENCOUNTER — Encounter: Payer: Self-pay | Admitting: Physical Therapy

## 2024-04-10 DIAGNOSIS — G2581 Restless legs syndrome: Secondary | ICD-10-CM | POA: Insufficient documentation

## 2024-04-10 DIAGNOSIS — G4700 Insomnia, unspecified: Secondary | ICD-10-CM | POA: Diagnosis not present

## 2024-04-10 DIAGNOSIS — G629 Polyneuropathy, unspecified: Secondary | ICD-10-CM | POA: Diagnosis not present

## 2024-04-10 DIAGNOSIS — M6281 Muscle weakness (generalized): Secondary | ICD-10-CM | POA: Insufficient documentation

## 2024-04-10 DIAGNOSIS — R269 Unspecified abnormalities of gait and mobility: Secondary | ICD-10-CM | POA: Diagnosis not present

## 2024-04-10 DIAGNOSIS — R2689 Other abnormalities of gait and mobility: Secondary | ICD-10-CM | POA: Diagnosis present

## 2024-04-10 DIAGNOSIS — R293 Abnormal posture: Secondary | ICD-10-CM | POA: Diagnosis present

## 2024-04-10 DIAGNOSIS — R2681 Unsteadiness on feet: Secondary | ICD-10-CM | POA: Insufficient documentation

## 2024-04-19 ENCOUNTER — Encounter: Payer: Self-pay | Admitting: Physical Therapy

## 2024-04-19 ENCOUNTER — Ambulatory Visit: Admitting: Physical Therapy

## 2024-04-19 DIAGNOSIS — R2681 Unsteadiness on feet: Secondary | ICD-10-CM

## 2024-04-19 DIAGNOSIS — R2689 Other abnormalities of gait and mobility: Secondary | ICD-10-CM

## 2024-04-19 NOTE — Therapy (Signed)
 OUTPATIENT PHYSICAL THERAPY NEURO TREATMENT NOTE   Patient Name: Krista Carlson MRN: [id number] DOB:11-18-1931, 89 y.o., female Today's Date: 04/19/2024   PCP: Aldo Hun, MD  REFERRING PROVIDER: Phebe Brasil, MD  END OF SESSION:  PT End of Session - 04/19/24 1310     Visit Number 2    Number of Visits 9    Date for PT Re-Evaluation 05/08/24    Authorization Type UHC Medicare & AARP    PT Start Time 1318    PT Stop Time 1357    PT Time Calculation (min) 39 min    Equipment Utilized During Treatment Gait belt    Activity Tolerance Patient tolerated treatment well    Behavior During Therapy WFL for tasks assessed/performed          Past Medical History:  Diagnosis Date   Anemia    Ankle fracture 2022   left   Aortic heart murmur    Arthritis    Celiac disease    Dr Lincoln Renshaw   Depression    Diverticulitis of colon    Glaucoma    Glaucoma 2012   Heart murmur    History of colonic polyps    3 mm, Dr Lincoln Renshaw   Hypertension    Hypothyroidism    Kidney stone    stone in left kidney now   Osteopenia    Peripheral neuropathy    Spinal stenosis    Past Surgical History:  Procedure Laterality Date   APPENDECTOMY  1966   @ tubal   BREAST CYST ASPIRATION Left 1983   CATARACT EXTRACTION Bilateral yrs ago   COLON BIOPSY  1998   Celiac Sprue; Dr Lincoln Renshaw   COLONOSCOPY  2010   Tics& polyp   COLONOSCOPY WITH PROPOFOL  N/A 06/25/2014   Procedure: COLONOSCOPY WITH PROPOFOL ;  Surgeon: Garrett Kallman, MD;  Location: [facility];  Service: Endoscopy;  Laterality: N/A;   ESOPHAGOGASTRODUODENOSCOPY (EGD) WITH PROPOFOL  N/A 06/25/2014   Procedure: ESOPHAGOGASTRODUODENOSCOPY (EGD) WITH PROPOFOL ;  Surgeon: Garrett Kallman, MD;  Location: [facility];  Service: Endoscopy;  Laterality: N/A;   HEMORRHOID SURGERY     HERNIA REPAIR  1943   Right Side   INGUINAL HERNIA REPAIR  11/16/2011   Procedure: HERNIA REPAIR INGUINAL ADULT;  Surgeon: Quitman Bucy, MD;  Location: [facility];   Service: General;  Laterality: Left;  with mesh   KNEE ARTHROSCOPY  2007   Left   NOSE SURGERY  2007   Fractured Nose Reset   orif broken ankle Left 2013   POLYPECTOMY  2010   due 2015, 3 mm   TONSILLECTOMY AND ADENOIDECTOMY  1941   TUBAL LIGATION  1966   Patient Active Problem List   Diagnosis Date Noted   Gait abnormality 03/06/2024   Insomnia 07/29/2022   Small fiber neuropathy 07/29/2022   Restless leg syndrome 12/17/2020   Restless leg 01/18/2017   Paresthesia 11/15/2016   Neck pain 11/15/2016   Cough 07/28/2013   Bilateral dry eyes 06/01/2013   Seroma complicating a procedure, left groin after LIH repair 11/30/2011   Personal history of renal calculi 03/01/2011   Peripheral neuropathy 02/08/2011   Hypothyroidism 09/18/2009   History of colonic polyps 09/08/2009   SPINAL STENOSIS OF LUMBAR REGION 08/09/2008   DIVERTICULOSIS, COLON 07/31/2008   LUMBAR RADICULOPATHY 07/31/2008   ARTHRALGIA 04/29/2008   HYPERLIPIDEMIA 12/01/2007   ANXIETY STATE NOS 06/08/2007   HYPERTENSION, ESSENTIAL NOS 06/08/2007   Vitamin B12 deficiency 01/26/2007   GLUTEN ENTEROPATHY 01/26/2007  OSTEOPENIA 01/26/2007   CARDIAC MURMUR, AORTIC 01/26/2007    ONSET DATE: many years  REFERRING DIAG: G62.9 (ICD-10-CM) - Small fiber neuropathy G47.00 (ICD-10-CM) - Insomnia, unspecified type G25.81 (ICD-10-CM) - Restless leg syndrome R26.9 (ICD-10-CM) - Gait abnormality  THERAPY DIAG:  Unsteadiness on feet  Other abnormalities of gait and mobility  Rationale for Evaluation and Treatment: Rehabilitation  SUBJECTIVE:                                                                                                                                                                                             SUBJECTIVE STATEMENT: Doing just about the same as usual.  Pt accompanied by: self  PERTINENT HISTORY: Anemia, ankle fx, depression, HTN, peripheral neuropathy, L ankle ORIF  PAIN: Yes:  NPRS scale: 6/10 Pain location: B lower legs  Pain description: throbbing, cold, hot  Aggravating factors: night time Relieving factors: moving the legs Are you having pain?   PRECAUTIONS: Fall  RED FLAGS: None   WEIGHT BEARING RESTRICTIONS: No  FALLS: Has patient fallen in last 6 months? Yes. Number of falls 1  LIVING ENVIRONMENT: Lives with: lives alone Lives in: House/apartment Stairs: 1- step to enter; 1 step into den, 1 story home Has following equipment at home: None  PLOF: Independent, enjoys playing bridge  PATIENT GOALS: my goal would be for my legs to stop hurting  OBJECTIVE:    TODAY'S TREATMENT: 04/19/2024 Activity Comments  Sidelying MMT: R hip abduction 4/5 L hip abduction 3+/5 with hip flexor compensation   Reviewed HEP Good return demo with min cues  Romberg stance with  EO/EC head turns/nods Very light UE support  Heel/toe raises x 10 Sidestepping x 1 minute   Tandem gait Forward/back walking More unstable in posterior direction  Standing marching in place 2 x 10 Standing hip extension 2 x 10 Standing step taps 2 x 10 Gastroc stretch 2 x 15 sec UE support   PATIENT EDUCATION: Education details: Fall prevention education in relation to definite reliance on vision for balance; reviewed HEP Person educated: Patient Education method: Explanation, Demonstration, and Verbal cues Education comprehension: verbalized understanding and returned demonstration  ------------------------------------------------------- Note: Objective measures were completed at Evaluation unless otherwise noted.  DIAGNOSTIC FINDINGS: none recent  COGNITION: Overall cognitive status: Within functional limits for tasks assessed   SENSATION: Pt reports intact sensation in B LEs   COORDINATION: Alternating pronation/supination: slight dysmetria B Alternating toe tap: intact B Finger to nose: intact B   POSTURE: flexed trunk  and in standing  LOWER EXTREMITY ROM:      Active  Right Eval Left Eval  Hip  flexion    Hip extension    Hip abduction    Hip adduction    Hip internal rotation    Hip external rotation    Knee flexion    Knee extension    Ankle dorsiflexion 11 8  Ankle plantarflexion    Ankle inversion    Ankle eversion     (Blank rows = not tested)  LOWER EXTREMITY MMT:    MMT (in sitting) Right Eval Left Eval  Hip flexion 4 4+  Hip extension    Hip abduction 4+ 4+  Hip adduction 4+ 4+  Hip internal rotation    Hip external rotation    Knee flexion 4 4-  Knee extension 4+ 4+  Ankle dorsiflexion 4+ 4+  Ankle plantarflexion 4 4  Ankle inversion    Ankle eversion    (Blank rows = not tested) GAIT: Findings: Assistive device utilized:None, Level of assistance: SBA, and Comments: R LE crosses midline and causes scissoring; R trunk lean and trunk flexed, narrow BOS, limited arm swing and with arms positioned backwards   FUNCTIONAL TESTS:                                                                                                                                  TREATMENT DATE: 04/10/24    PATIENT EDUCATION: Education details: prognosis, POC, HEP, exam findings, educated pt that PT does not affect neuropathic pain directly and suggested to speak to Dr. Gracie Lav about pain management  Person educated: Patient Education method: Explanation, Demonstration, Tactile cues, Verbal cues, and Handouts Education comprehension: verbalized understanding  HOME EXERCISE PROGRAM: Access Code: [email or web address] URL: [email or web address] Date: 04/10/2024 Prepared by: [facility] - Outpatient  Rehab - [facility]  Program Notes perform activities at counter top for safety  Exercises - Sit to Stand Without Arm Support  - 1 x daily - 5 x weekly - 2 sets - 10 reps - Standing Hip Abduction with Counter Support  - 1 x daily - 5 x weekly - 2 sets - 10 reps - Narrow Stance with Counter Support  - 1 x daily - 5 x weekly - 2 sets -  30 sec hold - Romberg Stance with Eyes Closed  - 1 x daily - 5 x weekly - 2 sets - 30 sec hold  GOALS: Goals reviewed with patient? Yes  SHORT TERM GOALS: Target date: 04/24/2024  Patient to be independent with initial HEP. Baseline: HEP initiated Goal status: IN PROGRESS    LONG TERM GOALS: Target date: 05/08/2024  Patient to be independent with advanced HEP. Baseline: Not yet initiated  Goal status: IN PROGRESS  Patient to demonstrate B LE strength >/=4+/5.  Baseline: See above Goal status: IN PROGRESS  Patient to score at least 17/24 on DGI in order to decrease risk of falls.  Baseline: 12 Goal status: IN PROGRESS  Patient to demonstrate gait speed of at least 2.62 ft/sec in order to  improve access to community.   Baseline: 2.2 ft/sec Goal status: IN PROGRESS  Patient to demonstrate 5xSTS test in <15 sec with good eccentric control in order to decrease risk of falls.  Baseline: 17 sec  Goal status: IN PROGRESS  Patient to report plans to return to chair yoga at least 2x/week.  Baseline: not reporting Goal status: IN PROGRESS   ASSESSMENT:  CLINICAL IMPRESSION: Pt presents today with no new complaints. Skilled PT session focused on review and progression of HEP. Pt is more unsteady with EC with head motions, and has improved stability with very light UE support.  Hip abduction strength slightly decreased LLE, with more of compensation in hip flexion; cues provided for standing hip abduction HEP to lessen hip external rotation.  She will continue to benefit from skilled PT towards goals for improved functional mobility and decreased fall risk.   OBJECTIVE IMPAIRMENTS: Abnormal gait, decreased balance, decreased coordination, decreased strength, postural dysfunction, and pain.   ACTIVITY LIMITATIONS: carrying, lifting, bending, standing, squatting, stairs, transfers, bathing, toileting, dressing, reach over head, hygiene/grooming, and locomotion level  PARTICIPATION  LIMITATIONS: meal prep, cleaning, laundry, shopping, community activity, yard work, and church  PERSONAL FACTORS: Age, Fitness, Past/current experiences, Time since onset of injury/illness/exacerbation, and 3+ comorbidities: Anemia, ankle fx, depression, HTN, peripheral neuropathy, L ankle ORIF,  are also affecting patient's functional outcome.   REHAB POTENTIAL: Good  CLINICAL DECISION MAKING: Evolving/moderate complexity  EVALUATION COMPLEXITY: Moderate  PLAN:  PT FREQUENCY: 2x/week  PT DURATION: 4 weeks  PLANNED INTERVENTIONS: 97164- PT Re-evaluation, 97750- Physical Performance Testing, 97110-Therapeutic exercises, 97530- Therapeutic activity, 97112- Neuromuscular re-education, 97535- Self Care, 16109- Manual therapy, [phone]- Gait training, [phone]- Canalith repositioning, [id number]- Electrical stimulation (unattended), 20560 (1-2 muscles), 20561 (3+ muscles)- Dry Needling, Patient/Family education, Balance training, Stair training, Taping, Joint mobilization, Spinal mobilization, Vestibular training, DME instructions, Cryotherapy, and Moist heat  PLAN FOR NEXT SESSION: Review HEP and progress with dynamic balance exercises and functional strengthening   Dessie Flow, PT 04/19/24 1:59 PM Phone: [phone] Fax: [phone]   [facility] at [facility] [address], [address] [place], [place] [address] Phone # [phone] Fax # [phone]

## 2024-04-24 ENCOUNTER — Ambulatory Visit: Admitting: Physical Therapy

## 2024-04-25 NOTE — Therapy (Signed)
 OUTPATIENT PHYSICAL THERAPY NEURO TREATMENT NOTE   Patient Name: Krista Carlson MRN: [id number] DOB:13-Jan-1932, 89 y.o., female Today's Date: 04/26/2024   PCP: Shayne Anes, MD  REFERRING PROVIDER: Onita Duos, MD  END OF SESSION:  PT End of Session - 04/26/24 1421     Visit Number 3    Number of Visits 9    Date for PT Re-Evaluation 05/08/24    Authorization Type UHC Medicare & AARP    PT Start Time 1407    PT Stop Time 1445    PT Time Calculation (min) 38 min    Equipment Utilized During Treatment --    Activity Tolerance Patient tolerated treatment well    Behavior During Therapy [facility] for tasks assessed/performed           Past Medical History:  Diagnosis Date   Anemia    Ankle fracture 2022   left   Aortic heart murmur    Arthritis    Celiac disease    Dr Vicci   Depression    Diverticulitis of colon    Glaucoma    Glaucoma 2012   Heart murmur    History of colonic polyps    3 mm, Dr Vicci   Hypertension    Hypothyroidism    Kidney stone    stone in left kidney now   Osteopenia    Peripheral neuropathy    Spinal stenosis    Past Surgical History:  Procedure Laterality Date   APPENDECTOMY  1966   @ tubal   BREAST CYST ASPIRATION Left 1983   CATARACT EXTRACTION Bilateral yrs ago   COLON BIOPSY  1998   Celiac Sprue; Dr Vicci   COLONOSCOPY  2010   Tics& polyp   COLONOSCOPY WITH PROPOFOL  N/A 06/25/2014   Procedure: COLONOSCOPY WITH PROPOFOL ;  Surgeon: Gladis MARLA Vicci, MD;  Location: [facility];  Service: Endoscopy;  Laterality: N/A;   ESOPHAGOGASTRODUODENOSCOPY (EGD) WITH PROPOFOL  N/A 06/25/2014   Procedure: ESOPHAGOGASTRODUODENOSCOPY (EGD) WITH PROPOFOL ;  Surgeon: Gladis MARLA Vicci, MD;  Location: [facility];  Service: Endoscopy;  Laterality: N/A;   HEMORRHOID SURGERY     HERNIA REPAIR  1943   Right Side   INGUINAL HERNIA REPAIR  11/16/2011   Procedure: HERNIA REPAIR INGUINAL ADULT;  Surgeon: Morene ONEIDA Olives, MD;  Location: [facility];   Service: General;  Laterality: Left;  with mesh   KNEE ARTHROSCOPY  2007   Left   NOSE SURGERY  2007   Fractured Nose Reset   orif broken ankle Left 2013   POLYPECTOMY  2010   due 2015, 3 mm   TONSILLECTOMY AND ADENOIDECTOMY  1941   TUBAL LIGATION  1966   Patient Active Problem List   Diagnosis Date Noted   Gait abnormality 03/06/2024   Insomnia 07/29/2022   Small fiber neuropathy 07/29/2022   Restless leg syndrome 12/17/2020   Restless leg 01/18/2017   Paresthesia 11/15/2016   Neck pain 11/15/2016   Cough 07/28/2013   Bilateral dry eyes 06/01/2013   Seroma complicating a procedure, left groin after LIH repair 11/30/2011   Personal history of renal calculi 03/01/2011   Peripheral neuropathy 02/08/2011   Hypothyroidism 09/18/2009   History of colonic polyps 09/08/2009   SPINAL STENOSIS OF LUMBAR REGION 08/09/2008   DIVERTICULOSIS, COLON 07/31/2008   LUMBAR RADICULOPATHY 07/31/2008   ARTHRALGIA 04/29/2008   HYPERLIPIDEMIA 12/01/2007   ANXIETY STATE NOS 06/08/2007   HYPERTENSION, ESSENTIAL NOS 06/08/2007   Vitamin B12 deficiency 01/26/2007   GLUTEN ENTEROPATHY 01/26/2007  OSTEOPENIA 01/26/2007   CARDIAC MURMUR, AORTIC 01/26/2007    ONSET DATE: many years  REFERRING DIAG: G62.9 (ICD-10-CM) - Small fiber neuropathy G47.00 (ICD-10-CM) - Insomnia, unspecified type G25.81 (ICD-10-CM) - Restless leg syndrome R26.9 (ICD-10-CM) - Gait abnormality  THERAPY DIAG:  Unsteadiness on feet  Other abnormalities of gait and mobility  Muscle weakness (generalized)  Abnormal posture  Rationale for Evaluation and Treatment: Rehabilitation  SUBJECTIVE:                                                                                                                                                                                             SUBJECTIVE STATEMENT: Everything was going fine, but I'm not doing my exercises. Reports I need to get better.  Pt accompanied by:  self  PERTINENT HISTORY: Anemia, ankle fx, depression, HTN, peripheral neuropathy, L ankle ORIF  PAIN: Yes: NPRS scale: 6/10 Pain location: B lower legs  Pain description: throbbing, cold, hot  Aggravating factors: night time Relieving factors: moving the legs Are you having pain?   PRECAUTIONS: Fall  RED FLAGS: None   WEIGHT BEARING RESTRICTIONS: No  FALLS: Has patient fallen in last 6 months? Yes. Number of falls 1  LIVING ENVIRONMENT: Lives with: lives alone Lives in: House/apartment Stairs: 1- step to enter; 1 step into den, 1 story home Has following equipment at home: None  PLOF: Independent, enjoys playing bridge  PATIENT GOALS: my goal would be for my legs to stop hurting  OBJECTIVE:     TODAY'S TREATMENT: 04/26/24 Activity Comments  sidelying hip ABD 2x10 each  Cueing for alignment. Harder on L LE  bridge 2x10 Cueing for glute contraction   standing hip ex 2# 2x10 each Good effort to maintain upright posture   sidestepping 2# 2x1 min Cueing to avoid lateral trunk lean   standing heel raises 2x15 Limited ROM/tendency to hoist self up  R/L runner's stretch 30 Cueing for proper positioning   STS with green medball 10x Cueing for slow eccentric lower       HOME EXERCISE PROGRAM: Access Code: [email or web address] URL: [email or web address] Date: 04/26/2024 Prepared by: [facility] - Outpatient  Rehab - [facility]  Program Notes perform activities at counter top for safety  Exercises - Sit to Stand Without Arm Support + 5# overhead lift - 1 x daily - 5 x weekly - 2 sets - 10 reps - Standing Hip Abduction with Counter Support 2#  - 1 x daily - 5 x weekly - 2 sets - 10 reps - Narrow Stance with Counter Support  - 1 x daily - 5 x weekly - 2 sets - 30  sec hold - Romberg Stance with Eyes Closed  - 1 x daily - 5 x weekly - 2 sets - 30 sec hold   PATIENT EDUCATION: Education details: HEP update, edu on adjustable ankle weights 1-5lbs Person  educated: Patient Education method: Explanation, Demonstration, Tactile cues, Verbal cues, and Handouts Education comprehension: verbalized understanding and returned demonstration    ------------------------------------------------------- Note: Objective measures were completed at Evaluation unless otherwise noted.  DIAGNOSTIC FINDINGS: none recent  COGNITION: Overall cognitive status: Within functional limits for tasks assessed   SENSATION: Pt reports intact sensation in B LEs   COORDINATION: Alternating pronation/supination: slight dysmetria B Alternating toe tap: intact B Finger to nose: intact B   POSTURE: flexed trunk  and in standing  LOWER EXTREMITY ROM:     Active  Right Eval Left Eval  Hip flexion    Hip extension    Hip abduction    Hip adduction    Hip internal rotation    Hip external rotation    Knee flexion    Knee extension    Ankle dorsiflexion 11 8  Ankle plantarflexion    Ankle inversion    Ankle eversion     (Blank rows = not tested)  LOWER EXTREMITY MMT:    MMT (in sitting) Right Eval Left Eval  Hip flexion 4 4+  Hip extension    Hip abduction 4+ 4+  Hip adduction 4+ 4+  Hip internal rotation    Hip external rotation    Knee flexion 4 4-  Knee extension 4+ 4+  Ankle dorsiflexion 4+ 4+  Ankle plantarflexion 4 4  Ankle inversion    Ankle eversion    (Blank rows = not tested) GAIT: Findings: Assistive device utilized:None, Level of assistance: SBA, and Comments: R LE crosses midline and causes scissoring; R trunk lean and trunk flexed, narrow BOS, limited arm swing and with arms positioned backwards   FUNCTIONAL TESTS:                                                                                                                                  TREATMENT DATE: 04/10/24    PATIENT EDUCATION: Education details: prognosis, POC, HEP, exam findings, educated pt that PT does not affect neuropathic pain directly and suggested to  speak to Dr. Onita about pain management  Person educated: Patient Education method: Explanation, Demonstration, Tactile cues, Verbal cues, and Handouts Education comprehension: verbalized understanding  HOME EXERCISE PROGRAM: Access Code: [email or web address] URL: [email or web address] Date: 04/10/2024 Prepared by: [facility] - Outpatient  Rehab - [facility]  Program Notes perform activities at counter top for safety  Exercises - Sit to Stand Without Arm Support  - 1 x daily - 5 x weekly - 2 sets - 10 reps - Standing Hip Abduction with Counter Support  - 1 x daily - 5 x weekly - 2 sets - 10 reps - Narrow Stance with Counter Support  - 1 x  daily - 5 x weekly - 2 sets - 30 sec hold - Romberg Stance with Eyes Closed  - 1 x daily - 5 x weekly - 2 sets - 30 sec hold  GOALS: Goals reviewed with patient? Yes  SHORT TERM GOALS: Target date: 04/24/2024  Patient to be independent with initial HEP. Baseline: HEP initiated Goal status: IN PROGRESS    LONG TERM GOALS: Target date: 05/08/2024  Patient to be independent with advanced HEP. Baseline: Not yet initiated  Goal status: IN PROGRESS  Patient to demonstrate B LE strength >/=4+/5.  Baseline: See above Goal status: IN PROGRESS  Patient to score at least 17/24 on DGI in order to decrease risk of falls.  Baseline: 12 Goal status: IN PROGRESS  Patient to demonstrate gait speed of at least 2.62 ft/sec in order to improve access to community.   Baseline: 2.2 ft/sec Goal status: IN PROGRESS  Patient to demonstrate 5xSTS test in <15 sec with good eccentric control in order to decrease risk of falls.  Baseline: 17 sec  Goal status: IN PROGRESS  Patient to report plans to return to chair yoga at least 2x/week.  Baseline: not reporting Goal status: IN PROGRESS   ASSESSMENT:  CLINICAL IMPRESSION: Patient arrived to session without complaints, but admits to limited compliance with HEP. Patient was lead through a series of  hip strengthening activities to address weakness identified last session. Patient with great endurance and tolerance for today's activities and made a great effort to correct form according to cueing provided. HEP was updated for increased challenge to better-align with her abilities. No complaints at end of session.   OBJECTIVE IMPAIRMENTS: Abnormal gait, decreased balance, decreased coordination, decreased strength, postural dysfunction, and pain.   ACTIVITY LIMITATIONS: carrying, lifting, bending, standing, squatting, stairs, transfers, bathing, toileting, dressing, reach over head, hygiene/grooming, and locomotion level  PARTICIPATION LIMITATIONS: meal prep, cleaning, laundry, shopping, community activity, yard work, and church  PERSONAL FACTORS: Age, Fitness, Past/current experiences, Time since onset of injury/illness/exacerbation, and 3+ comorbidities: Anemia, ankle fx, depression, HTN, peripheral neuropathy, L ankle ORIF,  are also affecting patient's functional outcome.   REHAB POTENTIAL: Good  CLINICAL DECISION MAKING: Evolving/moderate complexity  EVALUATION COMPLEXITY: Moderate  PLAN:  PT FREQUENCY: 2x/week  PT DURATION: 4 weeks  PLANNED INTERVENTIONS: 97164- PT Re-evaluation, 97750- Physical Performance Testing, 97110-Therapeutic exercises, 97530- Therapeutic activity, 97112- Neuromuscular re-education, 97535- Self Care, 02859- Manual therapy, [phone]- Gait training, [phone]- Canalith repositioning, [id number]- Electrical stimulation (unattended), 20560 (1-2 muscles), 20561 (3+ muscles)- Dry Needling, Patient/Family education, Balance training, Stair training, Taping, Joint mobilization, Spinal mobilization, Vestibular training, DME instructions, Cryotherapy, and Moist heat  PLAN FOR NEXT SESSION: Review HEP and progress with dynamic balance exercises and functional strengthening     Louana Terrilyn Christians, PT, DPT 04/26/24 2:47 PM  [facility] at [facility] [address], [address] [place], [place] [address] Phone # [phone] Fax # [phone]

## 2024-04-26 ENCOUNTER — Encounter: Payer: Self-pay | Admitting: Physical Therapy

## 2024-04-26 ENCOUNTER — Ambulatory Visit: Admitting: Physical Therapy

## 2024-04-26 DIAGNOSIS — R2681 Unsteadiness on feet: Secondary | ICD-10-CM

## 2024-04-26 DIAGNOSIS — M6281 Muscle weakness (generalized): Secondary | ICD-10-CM

## 2024-04-26 DIAGNOSIS — R2689 Other abnormalities of gait and mobility: Secondary | ICD-10-CM

## 2024-04-26 DIAGNOSIS — R293 Abnormal posture: Secondary | ICD-10-CM

## 2024-04-30 NOTE — Therapy (Incomplete)
 OUTPATIENT PHYSICAL THERAPY NEURO TREATMENT NOTE   Patient Name: Krista Carlson MRN: [id number] DOB:Jun 29, 1932, 89 y.o., female Today's Date: 04/30/2024   PCP: Shayne Anes, MD  REFERRING PROVIDER: Onita Duos, MD  END OF SESSION:     Past Medical History:  Diagnosis Date   Anemia    Ankle fracture 2022   left   Aortic heart murmur    Arthritis    Celiac disease    Dr Vicci   Depression    Diverticulitis of colon    Glaucoma    Glaucoma 2012   Heart murmur    History of colonic polyps    3 mm, Dr Vicci   Hypertension    Hypothyroidism    Kidney stone    stone in left kidney now   Osteopenia    Peripheral neuropathy    Spinal stenosis    Past Surgical History:  Procedure Laterality Date   APPENDECTOMY  1966   @ tubal   BREAST CYST ASPIRATION Left 1983   CATARACT EXTRACTION Bilateral yrs ago   COLON BIOPSY  1998   Celiac Sprue; Dr Vicci   COLONOSCOPY  2010   Tics& polyp   COLONOSCOPY WITH PROPOFOL  N/A 06/25/2014   Procedure: COLONOSCOPY WITH PROPOFOL ;  Surgeon: Gladis MARLA Vicci, MD;  Location: [facility];  Service: Endoscopy;  Laterality: N/A;   ESOPHAGOGASTRODUODENOSCOPY (EGD) WITH PROPOFOL  N/A 06/25/2014   Procedure: ESOPHAGOGASTRODUODENOSCOPY (EGD) WITH PROPOFOL ;  Surgeon: Gladis MARLA Vicci, MD;  Location: [facility];  Service: Endoscopy;  Laterality: N/A;   HEMORRHOID SURGERY     HERNIA REPAIR  1943   Right Side   INGUINAL HERNIA REPAIR  11/16/2011   Procedure: HERNIA REPAIR INGUINAL ADULT;  Surgeon: Morene ONEIDA Olives, MD;  Location: [facility];  Service: General;  Laterality: Left;  with mesh   KNEE ARTHROSCOPY  2007   Left   NOSE SURGERY  2007   Fractured Nose Reset   orif broken ankle Left 2013   POLYPECTOMY  2010   due 2015, 3 mm   TONSILLECTOMY AND ADENOIDECTOMY  1941   TUBAL LIGATION  1966   Patient Active Problem List   Diagnosis Date Noted   Gait abnormality 03/06/2024   Insomnia 07/29/2022   Small fiber neuropathy 07/29/2022    Restless leg syndrome 12/17/2020   Restless leg 01/18/2017   Paresthesia 11/15/2016   Neck pain 11/15/2016   Cough 07/28/2013   Bilateral dry eyes 06/01/2013   Seroma complicating a procedure, left groin after LIH repair 11/30/2011   Personal history of renal calculi 03/01/2011   Peripheral neuropathy 02/08/2011   Hypothyroidism 09/18/2009   History of colonic polyps 09/08/2009   SPINAL STENOSIS OF LUMBAR REGION 08/09/2008   DIVERTICULOSIS, COLON 07/31/2008   LUMBAR RADICULOPATHY 07/31/2008   ARTHRALGIA 04/29/2008   HYPERLIPIDEMIA 12/01/2007   ANXIETY STATE NOS 06/08/2007   HYPERTENSION, ESSENTIAL NOS 06/08/2007   Vitamin B12 deficiency 01/26/2007   GLUTEN ENTEROPATHY 01/26/2007   OSTEOPENIA 01/26/2007   CARDIAC MURMUR, AORTIC 01/26/2007    ONSET DATE: many years  REFERRING DIAG: G62.9 (ICD-10-CM) - Small fiber neuropathy G47.00 (ICD-10-CM) - Insomnia, unspecified type G25.81 (ICD-10-CM) - Restless leg syndrome R26.9 (ICD-10-CM) - Gait abnormality  THERAPY DIAG:  No diagnosis found.  Rationale for Evaluation and Treatment: Rehabilitation  SUBJECTIVE:  SUBJECTIVE STATEMENT: Everything was going fine, but I'm not doing my exercises. Reports I need to get better.  Pt accompanied by: self  PERTINENT HISTORY: Anemia, ankle fx, depression, HTN, peripheral neuropathy, L ankle ORIF  PAIN: Yes: NPRS scale: 6/10 Pain location: B lower legs  Pain description: throbbing, cold, hot  Aggravating factors: night time Relieving factors: moving the legs Are you having pain?   PRECAUTIONS: Fall  RED FLAGS: None   WEIGHT BEARING RESTRICTIONS: No  FALLS: Has patient fallen in last 6 months? Yes. Number of falls 1  LIVING ENVIRONMENT: Lives with: lives alone Lives in:  House/apartment Stairs: 1- step to enter; 1 step into den, 1 story home Has following equipment at home: None  PLOF: Independent, enjoys playing bridge  PATIENT GOALS: my goal would be for my legs to stop hurting  OBJECTIVE:    TODAY'S TREATMENT: 05/01/24 Activity Comments                       TODAY'S TREATMENT: 04/26/24 Activity Comments  sidelying hip ABD 2x10 each  Cueing for alignment. Harder on L LE  bridge 2x10 Cueing for glute contraction   standing hip ex 2# 2x10 each Good effort to maintain upright posture   sidestepping 2# 2x1 min Cueing to avoid lateral trunk lean   standing heel raises 2x15 Limited ROM/tendency to hoist self up  R/L runner's stretch 30 Cueing for proper positioning   STS with green medball 10x Cueing for slow eccentric lower       HOME EXERCISE PROGRAM: Access Code: [email or web address] URL: [email or web address] Date: 04/26/2024 Prepared by: [facility] - Outpatient  Rehab - [facility]  Program Notes perform activities at counter top for safety  Exercises - Sit to Stand Without Arm Support + 5# overhead lift - 1 x daily - 5 x weekly - 2 sets - 10 reps - Standing Hip Abduction with Counter Support 2#  - 1 x daily - 5 x weekly - 2 sets - 10 reps - Narrow Stance with Counter Support  - 1 x daily - 5 x weekly - 2 sets - 30 sec hold - Romberg Stance with Eyes Closed  - 1 x daily - 5 x weekly - 2 sets - 30 sec hold   PATIENT EDUCATION: Education details: HEP update, edu on adjustable ankle weights 1-5lbs Person educated: Patient Education method: Explanation, Demonstration, Tactile cues, Verbal cues, and Handouts Education comprehension: verbalized understanding and returned demonstration    ------------------------------------------------------- Note: Objective measures were completed at Evaluation unless otherwise noted.  DIAGNOSTIC FINDINGS: none recent  COGNITION: Overall cognitive status: Within functional limits  for tasks assessed   SENSATION: Pt reports intact sensation in B LEs   COORDINATION: Alternating pronation/supination: slight dysmetria B Alternating toe tap: intact B Finger to nose: intact B   POSTURE: flexed trunk  and in standing  LOWER EXTREMITY ROM:     Active  Right Eval Left Eval  Hip flexion    Hip extension    Hip abduction    Hip adduction    Hip internal rotation    Hip external rotation    Knee flexion    Knee extension    Ankle dorsiflexion 11 8  Ankle plantarflexion    Ankle inversion    Ankle eversion     (Blank rows = not tested)  LOWER EXTREMITY MMT:    MMT (in sitting) Right Eval Left Eval  Hip flexion 4 4+  Hip extension  Hip abduction 4+ 4+  Hip adduction 4+ 4+  Hip internal rotation    Hip external rotation    Knee flexion 4 4-  Knee extension 4+ 4+  Ankle dorsiflexion 4+ 4+  Ankle plantarflexion 4 4  Ankle inversion    Ankle eversion    (Blank rows = not tested) GAIT: Findings: Assistive device utilized:None, Level of assistance: SBA, and Comments: R LE crosses midline and causes scissoring; R trunk lean and trunk flexed, narrow BOS, limited arm swing and with arms positioned backwards   FUNCTIONAL TESTS:                                                                                                                                  TREATMENT DATE: 04/10/24    PATIENT EDUCATION: Education details: prognosis, POC, HEP, exam findings, educated pt that PT does not affect neuropathic pain directly and suggested to speak to Dr. Onita about pain management  Person educated: Patient Education method: Explanation, Demonstration, Tactile cues, Verbal cues, and Handouts Education comprehension: verbalized understanding  HOME EXERCISE PROGRAM: Access Code: [email or web address] URL: [email or web address] Date: 04/10/2024 Prepared by: [facility] - Outpatient  Rehab - [facility]  Program Notes perform activities at counter top  for safety  Exercises - Sit to Stand Without Arm Support  - 1 x daily - 5 x weekly - 2 sets - 10 reps - Standing Hip Abduction with Counter Support  - 1 x daily - 5 x weekly - 2 sets - 10 reps - Narrow Stance with Counter Support  - 1 x daily - 5 x weekly - 2 sets - 30 sec hold - Romberg Stance with Eyes Closed  - 1 x daily - 5 x weekly - 2 sets - 30 sec hold  GOALS: Goals reviewed with patient? Yes  SHORT TERM GOALS: Target date: 04/24/2024  Patient to be independent with initial HEP. Baseline: HEP initiated Goal status: IN PROGRESS    LONG TERM GOALS: Target date: 05/08/2024  Patient to be independent with advanced HEP. Baseline: Not yet initiated  Goal status: IN PROGRESS  Patient to demonstrate B LE strength >/=4+/5.  Baseline: See above Goal status: IN PROGRESS  Patient to score at least 17/24 on DGI in order to decrease risk of falls.  Baseline: 12 Goal status: IN PROGRESS  Patient to demonstrate gait speed of at least 2.62 ft/sec in order to improve access to community.   Baseline: 2.2 ft/sec Goal status: IN PROGRESS  Patient to demonstrate 5xSTS test in <15 sec with good eccentric control in order to decrease risk of falls.  Baseline: 17 sec  Goal status: IN PROGRESS  Patient to report plans to return to chair yoga at least 2x/week.  Baseline: not reporting Goal status: IN PROGRESS   ASSESSMENT:  CLINICAL IMPRESSION: Patient arrived to session without complaints, but admits to limited compliance with HEP. Patient was lead through a series of hip strengthening activities  to address weakness identified last session. Patient with great endurance and tolerance for today's activities and made a great effort to correct form according to cueing provided. HEP was updated for increased challenge to better-align with her abilities. No complaints at end of session.   OBJECTIVE IMPAIRMENTS: Abnormal gait, decreased balance, decreased coordination, decreased strength,  postural dysfunction, and pain.   ACTIVITY LIMITATIONS: carrying, lifting, bending, standing, squatting, stairs, transfers, bathing, toileting, dressing, reach over head, hygiene/grooming, and locomotion level  PARTICIPATION LIMITATIONS: meal prep, cleaning, laundry, shopping, community activity, yard work, and church  PERSONAL FACTORS: Age, Fitness, Past/current experiences, Time since onset of injury/illness/exacerbation, and 3+ comorbidities: Anemia, ankle fx, depression, HTN, peripheral neuropathy, L ankle ORIF,  are also affecting patient's functional outcome.   REHAB POTENTIAL: Good  CLINICAL DECISION MAKING: Evolving/moderate complexity  EVALUATION COMPLEXITY: Moderate  PLAN:  PT FREQUENCY: 2x/week  PT DURATION: 4 weeks  PLANNED INTERVENTIONS: 97164- PT Re-evaluation, 97750- Physical Performance Testing, 97110-Therapeutic exercises, 97530- Therapeutic activity, 97112- Neuromuscular re-education, 97535- Self Care, 02859- Manual therapy, [phone]- Gait training, [phone]- Canalith repositioning, H9716- Electrical stimulation (unattended), 20560 (1-2 muscles), 20561 (3+ muscles)- Dry Needling, Patient/Family education, Balance training, Stair training, Taping, Joint mobilization, Spinal mobilization, Vestibular training, DME instructions, Cryotherapy, and Moist heat  PLAN FOR NEXT SESSION: Review HEP and progress with dynamic balance exercises and functional strengthening     Louana Terrilyn Christians, PT, DPT 04/30/24 1:08 PM  [facility] at [facility] [address] [place], [place] [address] Phone # [phone] Fax # [phone]

## 2024-05-01 ENCOUNTER — Ambulatory Visit: Attending: Neurology | Admitting: Physical Therapy

## 2024-05-01 DIAGNOSIS — M6281 Muscle weakness (generalized): Secondary | ICD-10-CM | POA: Insufficient documentation

## 2024-05-01 DIAGNOSIS — R2689 Other abnormalities of gait and mobility: Secondary | ICD-10-CM | POA: Insufficient documentation

## 2024-05-01 DIAGNOSIS — R293 Abnormal posture: Secondary | ICD-10-CM | POA: Insufficient documentation

## 2024-05-01 DIAGNOSIS — R2681 Unsteadiness on feet: Secondary | ICD-10-CM | POA: Insufficient documentation

## 2024-05-02 NOTE — Therapy (Incomplete)
 OUTPATIENT PHYSICAL THERAPY NEURO TREATMENT NOTE   Patient Name: Krista Carlson MRN: [id number] DOB:07-15-1932, 89 y.o., female Today's Date: 05/02/2024   PCP: Shayne Anes, MD  REFERRING PROVIDER: Onita Duos, MD  END OF SESSION:     Past Medical History:  Diagnosis Date   Anemia    Ankle fracture 2022   left   Aortic heart murmur    Arthritis    Celiac disease    Dr Vicci   Depression    Diverticulitis of colon    Glaucoma    Glaucoma 2012   Heart murmur    History of colonic polyps    3 mm, Dr Vicci   Hypertension    Hypothyroidism    Kidney stone    stone in left kidney now   Osteopenia    Peripheral neuropathy    Spinal stenosis    Past Surgical History:  Procedure Laterality Date   APPENDECTOMY  1966   @ tubal   BREAST CYST ASPIRATION Left 1983   CATARACT EXTRACTION Bilateral yrs ago   COLON BIOPSY  1998   Celiac Sprue; Dr Vicci   COLONOSCOPY  2010   Tics& polyp   COLONOSCOPY WITH PROPOFOL  N/A 06/25/2014   Procedure: COLONOSCOPY WITH PROPOFOL ;  Surgeon: Gladis MARLA Vicci, MD;  Location: [facility];  Service: Endoscopy;  Laterality: N/A;   ESOPHAGOGASTRODUODENOSCOPY (EGD) WITH PROPOFOL  N/A 06/25/2014   Procedure: ESOPHAGOGASTRODUODENOSCOPY (EGD) WITH PROPOFOL ;  Surgeon: Gladis MARLA Vicci, MD;  [facility];  Service: Endoscopy;  Laterality: N/A;   HEMORRHOID SURGERY     HERNIA REPAIR  1943   Right Side   INGUINAL HERNIA REPAIR  11/16/2011   Procedure: HERNIA REPAIR INGUINAL ADULT;  Surgeon: Morene ONEIDA Olives, MD;  Location: [facility];  Service: General;  Laterality: Left;  with mesh   KNEE ARTHROSCOPY  2007   Left   NOSE SURGERY  2007   Fractured Nose Reset   orif broken ankle Left 2013   POLYPECTOMY  2010   due 2015, 3 mm   TONSILLECTOMY AND ADENOIDECTOMY  1941   TUBAL LIGATION  1966   Patient Active Problem List   Diagnosis Date Noted   Gait abnormality 03/06/2024   Insomnia 07/29/2022   Small fiber neuropathy 07/29/2022    Restless leg syndrome 12/17/2020   Restless leg 01/18/2017   Paresthesia 11/15/2016   Neck pain 11/15/2016   Cough 07/28/2013   Bilateral dry eyes 06/01/2013   Seroma complicating a procedure, left groin after LIH repair 11/30/2011   Personal history of renal calculi 03/01/2011   Peripheral neuropathy 02/08/2011   Hypothyroidism 09/18/2009   History of colonic polyps 09/08/2009   SPINAL STENOSIS OF LUMBAR REGION 08/09/2008   DIVERTICULOSIS, COLON 07/31/2008   LUMBAR RADICULOPATHY 07/31/2008   ARTHRALGIA 04/29/2008   HYPERLIPIDEMIA 12/01/2007   ANXIETY STATE NOS 06/08/2007   HYPERTENSION, ESSENTIAL NOS 06/08/2007   Vitamin B12 deficiency 01/26/2007   GLUTEN ENTEROPATHY 01/26/2007   OSTEOPENIA 01/26/2007   CARDIAC MURMUR, AORTIC 01/26/2007    ONSET DATE: many years  REFERRING DIAG: G62.9 (ICD-10-CM) - Small fiber neuropathy G47.00 (ICD-10-CM) - Insomnia, unspecified type G25.81 (ICD-10-CM) - Restless leg syndrome R26.9 (ICD-10-CM) - Gait abnormality  THERAPY DIAG:  No diagnosis found.  Rationale for Evaluation and Treatment: Rehabilitation  SUBJECTIVE:  SUBJECTIVE STATEMENT: Everything was going fine, but I'm not doing my exercises. Reports I need to get better.  Pt accompanied by: self  PERTINENT HISTORY: Anemia, ankle fx, depression, HTN, peripheral neuropathy, L ankle ORIF  PAIN: Yes: NPRS scale: 6/10 Pain location: B lower legs  Pain description: throbbing, cold, hot  Aggravating factors: night time Relieving factors: moving the legs Are you having pain?   PRECAUTIONS: Fall  RED FLAGS: None   WEIGHT BEARING RESTRICTIONS: No  FALLS: Has patient fallen in last 6 months? Yes. Number of falls 1  LIVING ENVIRONMENT: Lives with: lives alone Lives in:  House/apartment Stairs: 1- step to enter; 1 step into den, 1 story home Has following equipment at home: None  PLOF: Independent, enjoys playing bridge  PATIENT GOALS: my goal would be for my legs to stop hurting  OBJECTIVE:    TODAY'S TREATMENT: 05/03/24 Activity Comments                       TODAY'S TREATMENT: 04/26/24 Activity Comments  sidelying hip ABD 2x10 each  Cueing for alignment. Harder on L LE  bridge 2x10 Cueing for glute contraction   standing hip ex 2# 2x10 each Good effort to maintain upright posture   sidestepping 2# 2x1 min Cueing to avoid lateral trunk lean   standing heel raises 2x15 Limited ROM/tendency to hoist self up  R/L runner's stretch 30 Cueing for proper positioning   STS with green medball 10x Cueing for slow eccentric lower       HOME EXERCISE PROGRAM: Access Code: [email or web address] URL: [email or web address] Date: 04/26/2024 Prepared by: [facility] - Outpatient  Rehab - [facility]  Program Notes perform activities at counter top for safety  Exercises - Sit to Stand Without Arm Support + 5# overhead lift - 1 x daily - 5 x weekly - 2 sets - 10 reps - Standing Hip Abduction with Counter Support 2#  - 1 x daily - 5 x weekly - 2 sets - 10 reps - Narrow Stance with Counter Support  - 1 x daily - 5 x weekly - 2 sets - 30 sec hold - Romberg Stance with Eyes Closed  - 1 x daily - 5 x weekly - 2 sets - 30 sec hold   PATIENT EDUCATION: Education details: HEP update, edu on adjustable ankle weights 1-5lbs Person educated: Patient Education method: Explanation, Demonstration, Tactile cues, Verbal cues, and Handouts Education comprehension: verbalized understanding and returned demonstration    ------------------------------------------------------- Note: Objective measures were completed at Evaluation unless otherwise noted.  DIAGNOSTIC FINDINGS: none recent  COGNITION: Overall cognitive status: Within functional limits  for tasks assessed   SENSATION: Pt reports intact sensation in B LEs   COORDINATION: Alternating pronation/supination: slight dysmetria B Alternating toe tap: intact B Finger to nose: intact B   POSTURE: flexed trunk  and in standing  LOWER EXTREMITY ROM:     Active  Right Eval Left Eval  Hip flexion    Hip extension    Hip abduction    Hip adduction    Hip internal rotation    Hip external rotation    Knee flexion    Knee extension    Ankle dorsiflexion 11 8  Ankle plantarflexion    Ankle inversion    Ankle eversion     (Blank rows = not tested)  LOWER EXTREMITY MMT:    MMT (in sitting) Right Eval Left Eval  Hip flexion 4 4+  Hip extension  Hip abduction 4+ 4+  Hip adduction 4+ 4+  Hip internal rotation    Hip external rotation    Knee flexion 4 4-  Knee extension 4+ 4+  Ankle dorsiflexion 4+ 4+  Ankle plantarflexion 4 4  Ankle inversion    Ankle eversion    (Blank rows = not tested) GAIT: Findings: Assistive device utilized:None, Level of assistance: SBA, and Comments: R LE crosses midline and causes scissoring; R trunk lean and trunk flexed, narrow BOS, limited arm swing and with arms positioned backwards   FUNCTIONAL TESTS:                                                                                                                                  TREATMENT DATE: 04/10/24    PATIENT EDUCATION: Education details: prognosis, POC, HEP, exam findings, educated pt that PT does not affect neuropathic pain directly and suggested to speak to Dr. Onita about pain management  Person educated: Patient Education method: Explanation, Demonstration, Tactile cues, Verbal cues, and Handouts Education comprehension: verbalized understanding  HOME EXERCISE PROGRAM: Access Code: [email or web address] URL: [email or web address] Date: 04/10/2024 Prepared by: [facility] - Outpatient  Rehab - [facility]  Program Notes perform activities at counter top  for safety  Exercises - Sit to Stand Without Arm Support  - 1 x daily - 5 x weekly - 2 sets - 10 reps - Standing Hip Abduction with Counter Support  - 1 x daily - 5 x weekly - 2 sets - 10 reps - Narrow Stance with Counter Support  - 1 x daily - 5 x weekly - 2 sets - 30 sec hold - Romberg Stance with Eyes Closed  - 1 x daily - 5 x weekly - 2 sets - 30 sec hold  GOALS: Goals reviewed with patient? Yes  SHORT TERM GOALS: Target date: 04/24/2024  Patient to be independent with initial HEP. Baseline: HEP initiated Goal status: IN PROGRESS    LONG TERM GOALS: Target date: 05/08/2024  Patient to be independent with advanced HEP. Baseline: Not yet initiated  Goal status: IN PROGRESS  Patient to demonstrate B LE strength >/=4+/5.  Baseline: See above Goal status: IN PROGRESS  Patient to score at least 17/24 on DGI in order to decrease risk of falls.  Baseline: 12 Goal status: IN PROGRESS  Patient to demonstrate gait speed of at least 2.62 ft/sec in order to improve access to community.   Baseline: 2.2 ft/sec Goal status: IN PROGRESS  Patient to demonstrate 5xSTS test in <15 sec with good eccentric control in order to decrease risk of falls.  Baseline: 17 sec  Goal status: IN PROGRESS  Patient to report plans to return to chair yoga at least 2x/week.  Baseline: not reporting Goal status: IN PROGRESS   ASSESSMENT:  CLINICAL IMPRESSION: Patient arrived to session without complaints, but admits to limited compliance with HEP. Patient was lead through a series of hip strengthening activities  to address weakness identified last session. Patient with great endurance and tolerance for today's activities and made a great effort to correct form according to cueing provided. HEP was updated for increased challenge to better-align with her abilities. No complaints at end of session.   OBJECTIVE IMPAIRMENTS: Abnormal gait, decreased balance, decreased coordination, decreased strength,  postural dysfunction, and pain.   ACTIVITY LIMITATIONS: carrying, lifting, bending, standing, squatting, stairs, transfers, bathing, toileting, dressing, reach over head, hygiene/grooming, and locomotion level  PARTICIPATION LIMITATIONS: meal prep, cleaning, laundry, shopping, community activity, yard work, and church  PERSONAL FACTORS: Age, Fitness, Past/current experiences, Time since onset of injury/illness/exacerbation, and 3+ comorbidities: Anemia, ankle fx, depression, HTN, peripheral neuropathy, L ankle ORIF,  are also affecting patient's functional outcome.   REHAB POTENTIAL: Good  CLINICAL DECISION MAKING: Evolving/moderate complexity  EVALUATION COMPLEXITY: Moderate  PLAN:  PT FREQUENCY: 2x/week  PT DURATION: 4 weeks  PLANNED INTERVENTIONS: 97164- PT Re-evaluation, 97750- Physical Performance Testing, 97110-Therapeutic exercises, 97530- Therapeutic activity, 97112- Neuromuscular re-education, 97535- Self Care, 02859- Manual therapy, [id number]- Gait training, [phone]- Canalith repositioning, H9716- Electrical stimulation (unattended), 20560 (1-2 muscles), 20561 (3+ muscles)- Dry Needling, Patient/Family education, Balance training, Stair training, Taping, Joint mobilization, Spinal mobilization, Vestibular training, DME instructions, Cryotherapy, and Moist heat  PLAN FOR NEXT SESSION: Review HEP and progress with dynamic balance exercises and functional strengthening     Louana Terrilyn Christians, PT, DPT 05/02/24 4:46 PM  [facility] at [facility] [address] [place], [place] [address] Phone # [phone] Fax # [phone]

## 2024-05-03 ENCOUNTER — Telehealth: Payer: Self-pay | Admitting: Physical Therapy

## 2024-05-03 ENCOUNTER — Ambulatory Visit: Admitting: Physical Therapy

## 2024-05-03 NOTE — Telephone Encounter (Signed)
 Called patient and left VM according to DPR in chart. Notified pt of missed appointment today and reminded of upcoming appointment day/time.   Louana Terrilyn Christians, PT, DPT 05/03/24 3:00 PM  [facility] at [facility] [address] [place], [address] [place], [place] [address] Phone # [phone] Fax # [phone]

## 2024-05-08 ENCOUNTER — Ambulatory Visit

## 2024-05-08 DIAGNOSIS — R2681 Unsteadiness on feet: Secondary | ICD-10-CM

## 2024-05-08 DIAGNOSIS — R2689 Other abnormalities of gait and mobility: Secondary | ICD-10-CM | POA: Diagnosis present

## 2024-05-08 DIAGNOSIS — R293 Abnormal posture: Secondary | ICD-10-CM | POA: Diagnosis present

## 2024-05-08 DIAGNOSIS — M6281 Muscle weakness (generalized): Secondary | ICD-10-CM

## 2024-05-08 NOTE — Therapy (Signed)
 OUTPATIENT PHYSICAL THERAPY NEURO TREATMENT NOTE and Recertification   Patient Name: Krista Carlson MRN: [id number] DOB:1932-10-09, 89 y.o., female Today's Date: 05/08/2024   PCP: Shayne Anes, MD  REFERRING PROVIDER: Onita Duos, MD  END OF SESSION:  PT End of Session - 05/08/24 1311     Visit Number 4    Number of Visits 9    Date for PT Re-Evaluation 06/05/24    Authorization Type [employer] & [employer]    PT Start Time 1315    PT Stop Time 1400    PT Time Calculation (min) 45 min    Activity Tolerance Patient tolerated treatment well    Behavior During Therapy [facility] for tasks assessed/performed           Past Medical History:  Diagnosis Date   Anemia    Ankle fracture 2022   left   Aortic heart murmur    Arthritis    Celiac disease    Dr Vicci   Depression    Diverticulitis of colon    Glaucoma    Glaucoma 2012   Heart murmur    History of colonic polyps    3 mm, Dr Vicci   Hypertension    Hypothyroidism    Kidney stone    stone in left kidney now   Osteopenia    Peripheral neuropathy    Spinal stenosis    Past Surgical History:  Procedure Laterality Date   APPENDECTOMY  1966   @ tubal   BREAST CYST ASPIRATION Left 1983   CATARACT EXTRACTION Bilateral yrs ago   COLON BIOPSY  1998   Celiac Sprue; Dr Vicci   COLONOSCOPY  2010   Tics& polyp   COLONOSCOPY WITH PROPOFOL  N/A 06/25/2014   Procedure: COLONOSCOPY WITH PROPOFOL ;  Surgeon: Gladis MARLA Vicci, MD;  Location: [facility];  Service: Endoscopy;  Laterality: N/A;   ESOPHAGOGASTRODUODENOSCOPY (EGD) WITH PROPOFOL  N/A 06/25/2014   Procedure: ESOPHAGOGASTRODUODENOSCOPY (EGD) WITH PROPOFOL ;  Surgeon: Gladis MARLA Vicci, MD;  Location: [facility];  Service: Endoscopy;  Laterality: N/A;   HEMORRHOID SURGERY     HERNIA REPAIR  1943   Right Side   INGUINAL HERNIA REPAIR  11/16/2011   Procedure: HERNIA REPAIR INGUINAL ADULT;  Surgeon: Morene ONEIDA Olives, MD;  Location: [facility];  Service: General;   Laterality: Left;  with mesh   KNEE ARTHROSCOPY  2007   Left   NOSE SURGERY  2007   Fractured Nose Reset   orif broken ankle Left 2013   POLYPECTOMY  2010   due 2015, 3 mm   TONSILLECTOMY AND ADENOIDECTOMY  1941   TUBAL LIGATION  1966   Patient Active Problem List   Diagnosis Date Noted   Gait abnormality 03/06/2024   Insomnia 07/29/2022   Small fiber neuropathy 07/29/2022   Restless leg syndrome 12/17/2020   Restless leg 01/18/2017   Paresthesia 11/15/2016   Neck pain 11/15/2016   Cough 07/28/2013   Bilateral dry eyes 06/01/2013   Seroma complicating a procedure, left groin after LIH repair 11/30/2011   Personal history of renal calculi 03/01/2011   Peripheral neuropathy 02/08/2011   Hypothyroidism 09/18/2009   History of colonic polyps 09/08/2009   SPINAL STENOSIS OF LUMBAR REGION 08/09/2008   DIVERTICULOSIS, COLON 07/31/2008   LUMBAR RADICULOPATHY 07/31/2008   ARTHRALGIA 04/29/2008   HYPERLIPIDEMIA 12/01/2007   ANXIETY STATE NOS 06/08/2007   HYPERTENSION, ESSENTIAL NOS 06/08/2007   Vitamin B12 deficiency 01/26/2007   GLUTEN ENTEROPATHY 01/26/2007   OSTEOPENIA 01/26/2007   CARDIAC  MURMUR, AORTIC 01/26/2007    ONSET DATE: many years  REFERRING DIAG: G62.9 (ICD-10-CM) - Small fiber neuropathy G47.00 (ICD-10-CM) - Insomnia, unspecified type G25.81 (ICD-10-CM) - Restless leg syndrome R26.9 (ICD-10-CM) - Gait abnormality  THERAPY DIAG:  Unsteadiness on feet  Other abnormalities of gait and mobility  Muscle weakness (generalized)  Abnormal posture  Rationale for Evaluation and Treatment: Rehabilitation  SUBJECTIVE:                                                                                                                                                                                             SUBJECTIVE STATEMENT: Things going ok. Feels about the same. Have not returned to yoga class and have not been performing HEP  Pt accompanied by:  self  PERTINENT HISTORY: Anemia, ankle fx, depression, HTN, peripheral neuropathy, L ankle ORIF  PAIN: Yes: NPRS scale: 6/10 Pain location: B lower legs  Pain description: throbbing, cold, hot  Aggravating factors: night time Relieving factors: moving the legs Are you having pain?   PRECAUTIONS: Fall  RED FLAGS: None   WEIGHT BEARING RESTRICTIONS: No  FALLS: Has patient fallen in last 6 months? Yes. Number of falls 1  LIVING ENVIRONMENT: Lives with: lives alone Lives in: House/apartment Stairs: 1- step to enter; 1 step into den, 1 story home Has following equipment at home: None  PLOF: Independent, enjoys playing bridge  PATIENT GOALS: my goal would be for my legs to stop hurting  OBJECTIVE:    TODAY'S TREATMENT: 05/08/24 Activity Comments  5xSTS 15 sec  DGI 15/24  10 meter walk 15 sec = 2.2 ft/sec  M-CTSIB Condition 3: mild Condition 4: moderate-severe x 30 sec  Romberg w/ head turns Eo/EC x 30 sec Added to HEP  Pt education Provided handout of current EBP regarding physical exercise and intervention strategies for small fiber neuropathy        TODAY'S TREATMENT: 04/26/24 Activity Comments  sidelying hip ABD 2x10 each  Cueing for alignment. Harder on L LE  bridge 2x10 Cueing for glute contraction   standing hip ex 2# 2x10 each Good effort to maintain upright posture   sidestepping 2# 2x1 min Cueing to avoid lateral trunk lean   standing heel raises 2x15 Limited ROM/tendency to hoist self up  R/L runner's stretch 30 Cueing for proper positioning   STS with green medball 10x Cueing for slow eccentric lower      [facility] PT Assessment - 05/08/24 0001       Standardized Balance Assessment   Standardized Balance Assessment 10 meter walk test;Five Times Sit to Stand;Dynamic Gait Index    Five times sit  to stand comments  15 sec    10 Meter Walk 15      Dynamic Gait Index   Level Surface Mild Impairment    Change in Gait Speed Mild Impairment    Gait with  Horizontal Head Turns Mild Impairment    Gait with Vertical Head Turns Mild Impairment    Gait and Pivot Turn Mild Impairment    Step Over Obstacle Moderate Impairment    Step Around Obstacles Mild Impairment    Steps Mild Impairment    Total Score 15           HOME EXERCISE PROGRAM: Access Code: [email or web address] URL: [email or web address] Date: 04/26/2024 Prepared by: [facility] - Outpatient  Rehab - [facility]  Program Notes perform activities at counter top for safety  Exercises - Sit to Stand Without Arm Support + 5# overhead lift - 1 x daily - 5 x weekly - 2 sets - 10 reps - Standing Hip Abduction with Counter Support 2#  - 1 x daily - 5 x weekly - 2 sets - 10 reps - Narrow Stance with Counter Support  - 1 x daily - 5 x weekly - 2 sets - 30 sec hold - Romberg Stance with Eyes Closed  - 1 x daily - 5 x weekly - 2 sets - 30 sec hold - Corner Balance Feet Together: Eyes Open With Head Turns  - 1 x daily - 7 x weekly - 3 sets - 30 sec hold - Corner Balance Feet Together: Eyes Closed With Head Turns  - 1 x daily - 7 x weekly - 3 sets - 30 sec hold   PATIENT EDUCATION: Education details: HEP update, edu on adjustable ankle weights 1-5lbs Person educated: Patient Education method: Explanation, Demonstration, Tactile cues, Verbal cues, and Handouts Education comprehension: verbalized understanding and returned demonstration    ------------------------------------------------------- Note: Objective measures were completed at Evaluation unless otherwise noted.  DIAGNOSTIC FINDINGS: none recent  COGNITION: Overall cognitive status: Within functional limits for tasks assessed   SENSATION: Pt reports intact sensation in B LEs   COORDINATION: Alternating pronation/supination: slight dysmetria B Alternating toe tap: intact B Finger to nose: intact B   POSTURE: flexed trunk  and in standing  LOWER EXTREMITY ROM:     Active  Right Eval Left Eval  Hip  flexion    Hip extension    Hip abduction    Hip adduction    Hip internal rotation    Hip external rotation    Knee flexion    Knee extension    Ankle dorsiflexion 11 8  Ankle plantarflexion    Ankle inversion    Ankle eversion     (Blank rows = not tested)  LOWER EXTREMITY MMT:    MMT (in sitting) Right Eval Left Eval  Hip flexion 4 4+  Hip extension    Hip abduction 4+ 4+  Hip adduction 4+ 4+  Hip internal rotation    Hip external rotation    Knee flexion 4 4-  Knee extension 4+ 4+  Ankle dorsiflexion 4+ 4+  Ankle plantarflexion 4 4  Ankle inversion    Ankle eversion    (Blank rows = not tested) GAIT: Findings: Assistive device utilized:None, Level of assistance: SBA, and Comments: R LE crosses midline and causes scissoring; R trunk lean and trunk flexed, narrow BOS, limited arm swing and with arms positioned backwards   FUNCTIONAL TESTS:   [facility] PT Assessment - 05/08/24 0001  Standardized Balance Assessment   Standardized Balance Assessment 10 meter walk test;Five Times Sit to Stand;Dynamic Gait Index    Five times sit to stand comments  15 sec    10 Meter Walk 15      Dynamic Gait Index   Level Surface Mild Impairment    Change in Gait Speed Mild Impairment    Gait with Horizontal Head Turns Mild Impairment    Gait with Vertical Head Turns Mild Impairment    Gait and Pivot Turn Mild Impairment    Step Over Obstacle Moderate Impairment    Step Around Obstacles Mild Impairment    Steps Mild Impairment    Total Score 15                                                                                                                                         GOALS: Goals reviewed with patient? Yes  SHORT TERM GOALS: Target date: 04/24/2024  Patient to be independent with initial HEP. Baseline: HEP initiated; reports non-compliance Goal status: NOT MET    LONG TERM GOALS: Target date: 06/05/2024  Patient to be independent with advanced  HEP. Baseline: Not yet initiated  Goal status: IN PROGRESS  Patient to demonstrate B LE strength >/=4+/5.  Baseline: See above; 5/5 Goal status: MET  Patient to score at least 17/24 on DGI in order to decrease risk of falls.  Baseline: 12; 15/24 Goal status: IN PROGRESS 05/08/24  Patient to demonstrate gait speed of at least 2.62 ft/sec in order to improve access to community.   Baseline: 2.2 ft/sec; 2.2 ft/sec Goal status: IN PROGRESS 05/08/24  Patient to demonstrate 5xSTS test in <15 sec with good eccentric control in order to decrease risk of falls.  Baseline: 17 sec; 15 sec Goal status: MET  Patient to report plans to return to chair yoga at least 2x/week.  Baseline: not reporting; not yet returned Goal status: IN PROGRESS   ASSESSMENT:  CLINICAL IMPRESSION: Patient arrived to session without complaints, but admits to limited compliance with HEP and not yet returning to chair/yoga class at [facility].  POC details performed/review with improved BLE strength evident w/ 5/5 manual muscle test to seated resisted tests and improved time 15 sec 5xSTS test indicating low risk for falls.  Dynamic Gait Index score improved from baseline 12 to 15/24 indicating high risk for falls w/ improved stability with walking and active head movements and stepping over obstacles.  Gait speed remains unchanged. M-CTSIB with moderate-severe sway x 30 sec condition 4 indicating decreased postural stability. Discussed best mgmt options for peripheral neuropathy in regards to best exercise/activity recommendations and relevant desensitization techniques to try and verbalized understanding and provided with comprehensive handout detailing cardiovascular exercise, strength, yoga/tai chi/balance, and activity modifications. Pt requests additional sessions to refine HEP and progress to return to community-based activity.   OBJECTIVE IMPAIRMENTS: Abnormal gait, decreased balance, decreased coordination,  decreased  strength, postural dysfunction, and pain.   ACTIVITY LIMITATIONS: carrying, lifting, bending, standing, squatting, stairs, transfers, bathing, toileting, dressing, reach over head, hygiene/grooming, and locomotion level  PARTICIPATION LIMITATIONS: meal prep, cleaning, laundry, shopping, community activity, yard work, and church  PERSONAL FACTORS: Age, Fitness, Past/current experiences, Time since onset of injury/illness/exacerbation, and 3+ comorbidities: Anemia, ankle fx, depression, HTN, peripheral neuropathy, L ankle ORIF,  are also affecting patient's functional outcome.   REHAB POTENTIAL: Good  CLINICAL DECISION MAKING: Evolving/moderate complexity  EVALUATION COMPLEXITY: Moderate  PLAN:  PT FREQUENCY: 2x/week  PT DURATION: 4 weeks  PLANNED INTERVENTIONS: 97164- PT Re-evaluation, 97750- Physical Performance Testing, 97110-Therapeutic exercises, 97530- Therapeutic activity, [id number]- Neuromuscular re-education, 97535- Self Care, 02859- Manual therapy, [id number]- Gait training, [phone]- Canalith repositioning, H9716- Electrical stimulation (unattended), 20560 (1-2 muscles), 20561 (3+ muscles)- Dry Needling, Patient/Family education, Balance training, Stair training, Taping, Joint mobilization, Spinal mobilization, Vestibular training, DME instructions, Cryotherapy, and Moist heat  PLAN FOR NEXT SESSION: Review HEP and progress with dynamic balance exercises and functional strengthening    2:17 PM, 05/08/24 M. Kelly Lorrane Mccay, PT, DPT Physical Therapist- [place] Office Number: [phone]

## 2024-05-09 NOTE — Therapy (Signed)
 OUTPATIENT PHYSICAL THERAPY NEURO TREATMENT NOTE   Patient Name: Krista Carlson MRN: [id number] DOB:[birth year], 89 y.o., female Today's Date: 05/09/2024   PCP: Shayne Anes, MD  REFERRING PROVIDER: Onita Duos, MD  END OF SESSION:     Past Medical History:  Diagnosis Date   Anemia    Ankle fracture 2022   left   Aortic heart murmur    Arthritis    Celiac disease    Dr Vicci   Depression    Diverticulitis of colon    Glaucoma    Glaucoma 2012   Heart murmur    History of colonic polyps    3 mm, Dr Vicci   Hypertension    Hypothyroidism    Kidney stone    stone in left kidney now   Osteopenia    Peripheral neuropathy    Spinal stenosis    Past Surgical History:  Procedure Laterality Date   APPENDECTOMY  1966   @ tubal   BREAST CYST ASPIRATION Left 1983   CATARACT EXTRACTION Bilateral yrs ago   COLON BIOPSY  1998   Celiac Sprue; Dr Vicci   COLONOSCOPY  2010   Tics& polyp   COLONOSCOPY WITH PROPOFOL  N/A 06/25/2014   Procedure: COLONOSCOPY WITH PROPOFOL ;  Surgeon: Gladis MARLA Vicci, MD;  Location[facility];  Service: Endoscopy;  Laterality: N/A;   ESOPHAGOGASTRODUODENOSCOPY (EGD) WITH PROPOFOL  N/A 06/25/2014   Procedure: ESOPHAGOGASTRODUODENOSCOPY (EGD) WITH PROPOFOL ;  Surgeon: Gladis MARLA Vicci, MD;  Location: [facility];  Service: Endoscopy;  Laterality: N/A;   HEMORRHOID SURGERY     HERNIA REPAIR  1943   Right Side   INGUINAL HERNIA REPAIR  11/16/2011   Procedure: HERNIA REPAIR INGUINAL ADULT;  Surgeon: Morene ONEIDA Olives, MD;  Location: [facility];  Service: General;  Laterality: Left;  with mesh   KNEE ARTHROSCOPY  2007   Left   NOSE SURGERY  2007   Fractured Nose Reset   orif broken ankle Left 2013   POLYPECTOMY  2010   due 2015, 3 mm   TONSILLECTOMY AND ADENOIDECTOMY  1941   TUBAL LIGATION  1966   Patient Active Problem List   Diagnosis Date Noted   Gait abnormality 03/06/2024   Insomnia 07/29/2022   Small fiber neuropathy 07/29/2022    Restless leg syndrome 12/17/2020   Restless leg 01/18/2017   Paresthesia 11/15/2016   Neck pain 11/15/2016   Cough 07/28/2013   Bilateral dry eyes 06/01/2013   Seroma complicating a procedure, left groin after LIH repair 11/30/2011   Personal history of renal calculi 03/01/2011   Peripheral neuropathy 02/08/2011   Hypothyroidism 09/18/2009   History of colonic polyps 09/08/2009   SPINAL STENOSIS OF LUMBAR REGION 08/09/2008   DIVERTICULOSIS, COLON 07/31/2008   LUMBAR RADICULOPATHY 07/31/2008   ARTHRALGIA 04/29/2008   HYPERLIPIDEMIA 12/01/2007   ANXIETY STATE NOS 06/08/2007   HYPERTENSION, ESSENTIAL NOS 06/08/2007   Vitamin B12 deficiency 01/26/2007   GLUTEN ENTEROPATHY 01/26/2007   OSTEOPENIA 01/26/2007   CARDIAC MURMUR, AORTIC 01/26/2007    ONSET DATE: many years  REFERRING DIAG: G62.9 (ICD-10-CM) - Small fiber neuropathy G47.00 (ICD-10-CM) - Insomnia, unspecified type G25.81 (ICD-10-CM) - Restless leg syndrome R26.9 (ICD-10-CM) - Gait abnormality  THERAPY DIAG:  No diagnosis found.  Rationale for Evaluation and Treatment: Rehabilitation  SUBJECTIVE:  SUBJECTIVE STATEMENT: Things going ok. Feels about the same. Have not returned to yoga class and have not been performing HEP  Pt accompanied by: self  PERTINENT HISTORY: Anemia, ankle fx, depression, HTN, peripheral neuropathy, L ankle ORIF  PAIN: Yes: NPRS scale: 6/10 Pain location: B lower legs  Pain description: throbbing, cold, hot  Aggravating factors: night time Relieving factors: moving the legs Are you having pain?   PRECAUTIONS: Fall  RED FLAGS: None   WEIGHT BEARING RESTRICTIONS: No  FALLS: Has patient fallen in last 6 months? Yes. Number of falls 1  LIVING ENVIRONMENT: Lives with: lives alone Lives in:  House/apartment Stairs: 1- step to enter; 1 step into den, 1 story home Has following equipment at home: None  PLOF: Independent, enjoys playing bridge  PATIENT GOALS: my goal would be for my legs to stop hurting  OBJECTIVE:     TODAY'S TREATMENT: 05/10/24 Activity Comments                        TODAY'S TREATMENT: 05/08/24 Activity Comments  5xSTS 15 sec  DGI 15/24  10 meter walk 15 sec = 2.2 ft/sec  M-CTSIB Condition 3: mild Condition 4: moderate-severe x 30 sec  Romberg w/ head turns Eo/EC x 30 sec Added to HEP  Pt education Provided handout of current EBP regarding physical exercise and intervention strategies for small fiber neuropathy        TODAY'S TREATMENT: 04/26/24 Activity Comments  sidelying hip ABD 2x10 each  Cueing for alignment. Harder on L LE  bridge 2x10 Cueing for glute contraction   standing hip ex 2# 2x10 each Good effort to maintain upright posture   sidestepping 2# 2x1 min Cueing to avoid lateral trunk lean   standing heel raises 2x15 Limited ROM/tendency to hoist self up  R/L runner's stretch 30 Cueing for proper positioning   STS with green medball 10x Cueing for slow eccentric lower         HOME EXERCISE PROGRAM: Access Code: [email or web address] URL: [email or web address] Date: 04/26/2024 Prepared by: [facility] - Outpatient  Rehab - [facility]  Program Notes perform activities at counter top for safety  Exercises - Sit to Stand Without Arm Support + 5# overhead lift - 1 x daily - 5 x weekly - 2 sets - 10 reps - Standing Hip Abduction with Counter Support 2#  - 1 x daily - 5 x weekly - 2 sets - 10 reps - Narrow Stance with Counter Support  - 1 x daily - 5 x weekly - 2 sets - 30 sec hold - Romberg Stance with Eyes Closed  - 1 x daily - 5 x weekly - 2 sets - 30 sec hold - Corner Balance Feet Together: Eyes Open With Head Turns  - 1 x daily - 7 x weekly - 3 sets - 30 sec hold - Corner Balance Feet Together: Eyes Closed  With Head Turns  - 1 x daily - 7 x weekly - 3 sets - 30 sec hold     ------------------------------------------------------- Note: Objective measures were completed at Evaluation unless otherwise noted.  DIAGNOSTIC FINDINGS: none recent  COGNITION: Overall cognitive status: Within functional limits for tasks assessed   SENSATION: Pt reports intact sensation in B LEs   COORDINATION: Alternating pronation/supination: slight dysmetria B Alternating toe tap: intact B Finger to nose: intact B   POSTURE: flexed trunk  and in standing  LOWER EXTREMITY ROM:     Active  Right Eval  Left Eval  Hip flexion    Hip extension    Hip abduction    Hip adduction    Hip internal rotation    Hip external rotation    Knee flexion    Knee extension    Ankle dorsiflexion 11 8  Ankle plantarflexion    Ankle inversion    Ankle eversion     (Blank rows = not tested)  LOWER EXTREMITY MMT:    MMT (in sitting) Right Eval Left Eval  Hip flexion 4 4+  Hip extension    Hip abduction 4+ 4+  Hip adduction 4+ 4+  Hip internal rotation    Hip external rotation    Knee flexion 4 4-  Knee extension 4+ 4+  Ankle dorsiflexion 4+ 4+  Ankle plantarflexion 4 4  Ankle inversion    Ankle eversion    (Blank rows = not tested) GAIT: Findings: Assistive device utilized:None, Level of assistance: SBA, and Comments: R LE crosses midline and causes scissoring; R trunk lean and trunk flexed, narrow BOS, limited arm swing and with arms positioned backwards   FUNCTIONAL TESTS:                                                                                                                                    GOALS: Goals reviewed with patient? Yes  SHORT TERM GOALS: Target date: 04/24/2024  Patient to be independent with initial HEP. Baseline: HEP initiated; reports non-compliance Goal status: NOT MET    LONG TERM GOALS: Target date: 06/05/2024  Patient to be independent with advanced  HEP. Baseline: Not yet initiated  Goal status: IN PROGRESS  Patient to demonstrate B LE strength >/=4+/5.  Baseline: See above; 5/5 Goal status: MET  Patient to score at least 17/24 on DGI in order to decrease risk of falls.  Baseline: 12; 15/24 Goal status: IN PROGRESS 05/08/24  Patient to demonstrate gait speed of at least 2.62 ft/sec in order to improve access to community.   Baseline: 2.2 ft/sec; 2.2 ft/sec Goal status: IN PROGRESS 05/08/24  Patient to demonstrate 5xSTS test in <15 sec with good eccentric control in order to decrease risk of falls.  Baseline: 17 sec; 15 sec Goal status: MET  Patient to report plans to return to chair yoga at least 2x/week.  Baseline: not reporting; not yet returned Goal status: IN PROGRESS   ASSESSMENT:  CLINICAL IMPRESSION: Patient arrived to session without complaints, but admits to limited compliance with HEP and not yet returning to chair/yoga class at [facility].  POC details performed/review with improved BLE strength evident w/ 5/5 manual muscle test to seated resisted tests and improved time 15 sec 5xSTS test indicating low risk for falls.  Dynamic Gait Index score improved from baseline 12 to 15/24 indicating high risk for falls w/ improved stability with walking and active head movements and stepping over obstacles.  Gait speed remains unchanged. M-CTSIB with moderate-severe sway  x 30 sec condition 4 indicating decreased postural stability. Discussed best mgmt options for peripheral neuropathy in regards to best exercise/activity recommendations and relevant desensitization techniques to try and verbalized understanding and provided with comprehensive handout detailing cardiovascular exercise, strength, yoga/tai chi/balance, and activity modifications. Pt requests additional sessions to refine HEP and progress to return to community-based activity.   OBJECTIVE IMPAIRMENTS: Abnormal gait, decreased balance, decreased coordination, decreased  strength, postural dysfunction, and pain.   ACTIVITY LIMITATIONS: carrying, lifting, bending, standing, squatting, stairs, transfers, bathing, toileting, dressing, reach over head, hygiene/grooming, and locomotion level  PARTICIPATION LIMITATIONS: meal prep, cleaning, laundry, shopping, community activity, yard work, and church  PERSONAL FACTORS: Age, Fitness, Past/current experiences, Time since onset of injury/illness/exacerbation, and 3+ comorbidities: Anemia, ankle fx, depression, HTN, peripheral neuropathy, L ankle ORIF,  are also affecting patient's functional outcome.   REHAB POTENTIAL: Good  CLINICAL DECISION MAKING: Evolving/moderate complexity  EVALUATION COMPLEXITY: Moderate  PLAN:  PT FREQUENCY: 2x/week  PT DURATION: 4 weeks  PLANNED INTERVENTIONS: 97164- PT Re-evaluation, 97750- Physical Performance Testing, 97110-Therapeutic exercises, 97530- Therapeutic activity, [id number]- Neuromuscular re-education, 97535- Self Care, 02859- Manual therapy, [id number]- Gait training, [phone]- Canalith repositioning, H9716- Electrical stimulation (unattended), 20560 (1-2 muscles), 20561 (3+ muscles)- Dry Needling, Patient/Family education, Balance training, Stair training, Taping, Joint mobilization, Spinal mobilization, Vestibular training, DME instructions, Cryotherapy, and Moist heat  PLAN FOR NEXT SESSION: Review HEP and progress with dynamic balance exercises and functional strengthening

## 2024-05-10 ENCOUNTER — Other Ambulatory Visit: Payer: Self-pay

## 2024-05-10 ENCOUNTER — Encounter: Payer: Self-pay | Admitting: Physical Therapy

## 2024-05-10 ENCOUNTER — Ambulatory Visit: Admitting: Physical Therapy

## 2024-05-10 DIAGNOSIS — R293 Abnormal posture: Secondary | ICD-10-CM

## 2024-05-10 DIAGNOSIS — M6281 Muscle weakness (generalized): Secondary | ICD-10-CM

## 2024-05-10 DIAGNOSIS — R2681 Unsteadiness on feet: Secondary | ICD-10-CM

## 2024-05-10 DIAGNOSIS — R2689 Other abnormalities of gait and mobility: Secondary | ICD-10-CM

## 2024-05-10 MED ORDER — METANX FC 3-35-2 MG PO CAPS: 1.0000 | ORAL_CAPSULE | Freq: Two times a day (BID) | ORAL | 1 refills | Status: DC

## 2024-05-10 NOTE — Telephone Encounter (Signed)
 2 different strengths on medlist:  Please advise on refill

## 2024-05-17 ENCOUNTER — Ambulatory Visit

## 2024-05-17 DIAGNOSIS — R2681 Unsteadiness on feet: Secondary | ICD-10-CM

## 2024-05-17 DIAGNOSIS — R293 Abnormal posture: Secondary | ICD-10-CM

## 2024-05-17 DIAGNOSIS — R2689 Other abnormalities of gait and mobility: Secondary | ICD-10-CM

## 2024-05-17 DIAGNOSIS — M6281 Muscle weakness (generalized): Secondary | ICD-10-CM

## 2024-05-17 NOTE — Therapy (Signed)
 OUTPATIENT PHYSICAL THERAPY NEURO TREATMENT NOTE   Patient Name: Krista Carlson MRN: [id number] DOB:[birth year], 89 y.o., female Today's Date: 05/17/2024   PCP: Shayne Anes, MD  REFERRING PROVIDER: Onita Duos, MD  END OF SESSION:  PT End of Session - 05/17/24 1312     Visit Number 6    Number of Visits 9    Date for PT Re-Evaluation 06/05/24    Authorization Type [employer] Medicare & AARP    PT Start Time 1315    PT Stop Time 1400    PT Time Calculation (min) 45 min    Equipment Utilized During Treatment Gait belt    Activity Tolerance Patient tolerated treatment well    Behavior During Therapy WFL for tasks assessed/performed            Past Medical History:  Diagnosis Date   Anemia    Ankle fracture 2022   left   Aortic heart murmur    Arthritis    Celiac disease    Dr Vicci   Depression    Diverticulitis of colon    Glaucoma    Glaucoma 2012   Heart murmur    History of colonic polyps    3 mm, Dr Vicci   Hypertension    Hypothyroidism    Kidney stone    stone in left kidney now   Osteopenia    Peripheral neuropathy    Spinal stenosis    Past Surgical History:  Procedure Laterality Date   APPENDECTOMY  1966   @ tubal   BREAST CYST ASPIRATION Left 1983   CATARACT EXTRACTION Bilateral yrs ago   COLON BIOPSY  1998   Celiac Sprue; Dr Vicci   COLONOSCOPY  2010   Tics& polyp   COLONOSCOPY WITH PROPOFOL  N/A 06/25/2014   Procedure: COLONOSCOPY WITH PROPOFOL ;  Surgeon: Gladis MARLA Vicci, MD;  Location: [facility];  Service: Endoscopy;  Laterality: N/A;   ESOPHAGOGASTRODUODENOSCOPY (EGD) WITH PROPOFOL  N/A 06/25/2014   Procedure: ESOPHAGOGASTRODUODENOSCOPY (EGD) WITH PROPOFOL ;  Surgeon: Gladis MARLA Vicci, MD;  Location: [facility];  Service: Endoscopy;  Laterality: N/A;   HEMORRHOID SURGERY     HERNIA REPAIR  1943   Right Side   INGUINAL HERNIA REPAIR  11/16/2011   Procedure: HERNIA REPAIR INGUINAL ADULT;  Surgeon: Morene ONEIDA Olives, MD;  Location: [facility];  Service: General;  Laterality: Left;  with mesh   KNEE ARTHROSCOPY  2007   Left   NOSE SURGERY  2007   Fractured Nose Reset   orif broken ankle Left 2013   POLYPECTOMY  2010   due 2015, 3 mm   TONSILLECTOMY AND ADENOIDECTOMY  1941   TUBAL LIGATION  1966   Patient Active Problem List   Diagnosis Date Noted   Gait abnormality 03/06/2024   Insomnia 07/29/2022   Small fiber neuropathy 07/29/2022   Restless leg syndrome 12/17/2020   Restless leg 01/18/2017   Paresthesia 11/15/2016   Neck pain 11/15/2016   Cough 07/28/2013   Bilateral dry eyes 06/01/2013   Seroma complicating a procedure, left groin after LIH repair 11/30/2011   Personal history of renal calculi 03/01/2011   Peripheral neuropathy 02/08/2011   Hypothyroidism 09/18/2009   History of colonic polyps 09/08/2009   SPINAL STENOSIS OF LUMBAR REGION 08/09/2008   DIVERTICULOSIS, COLON 07/31/2008   LUMBAR RADICULOPATHY 07/31/2008   ARTHRALGIA 04/29/2008   HYPERLIPIDEMIA 12/01/2007   ANXIETY STATE NOS 06/08/2007   HYPERTENSION, ESSENTIAL NOS 06/08/2007   Vitamin B12 deficiency 01/26/2007   GLUTEN ENTEROPATHY  01/26/2007   OSTEOPENIA 01/26/2007   CARDIAC MURMUR, AORTIC 01/26/2007    ONSET DATE: many years  REFERRING DIAG: G62.9 (ICD-10-CM) - Small fiber neuropathy G47.00 (ICD-10-CM) - Insomnia, unspecified type G25.81 (ICD-10-CM) - Restless leg syndrome R26.9 (ICD-10-CM) - Gait abnormality  THERAPY DIAG:  Unsteadiness on feet  Other abnormalities of gait and mobility  Muscle weakness (generalized)  Abnormal posture  Rationale for Evaluation and Treatment: Rehabilitation  SUBJECTIVE:                                                                                                                                                                                             SUBJECTIVE STATEMENT: Haven't been up to much  Pt accompanied by: self  PERTINENT HISTORY: Anemia, ankle fx, depression, HTN,  peripheral neuropathy, L ankle ORIF  PAIN: Yes: NPRS scale: /10 Pain location: B lower legs  Pain description: throbbing, cold, hot  Aggravating factors: night time Relieving factors: moving the legs   PRECAUTIONS: Fall  RED FLAGS: None   WEIGHT BEARING RESTRICTIONS: No  FALLS: Has patient fallen in last 6 months? Yes. Number of falls 1  LIVING ENVIRONMENT: Lives with: lives alone Lives in: House/apartment Stairs: 1- step to enter; 1 step into den, 1 story home Has following equipment at home: None  PLOF: Independent, enjoys playing bridge  PATIENT GOALS: my goal would be for my legs to stop hurting  OBJECTIVE:   TODAY'S TREATMENT: 05/17/24 Activity Comments  Gait training Trials w/ cane I think I do better without it.  Improved stance control RLE w/ cane in left hand  4-square step -canes -canes + 6 hurdles + airex pad CGA throughout  High step hurdles CGA for single limb support  Dynamic standing balance -sidestep x 60 sec UE support -tandem walk x 60 sec UE support -   Multisensory static balance -increased sway on compliant surfaces or eyes closed or with head movements -rocker board x 60 sec ea direction  NU-step level 3 x 8 min 2 min warm-up 30 sec speed interval; 60 sec recovery       TODAY'S TREATMENT: 05/10/24 Activity Comments  STS on foam 10x Cueing to reduce eccentric speed, then tendency to push backs of LEs into mat   Green medball + alt step ups 2x10 CGA; R>L hip instability and mild imbalance. Cues to address trunk flexion   fwd/back stepping  Weaned UE support occasionally and required cues for wider BOS and safe foot placement   sidestepping over cones Cueing for safe foot placement, upright posture, avoiding circumduction   standing on foam EC Mild-mod sway  Standing on foam +  head turns/nods Mild-mod sway  Stepping fwd/back onto/off foam  1 UE support required and cues to look down at feet to ensure safety           PATIENT  EDUCATION: Education details: counseled on habit-matching in order to increase motivation to perform HEP, discussed benefits of cane in crowded environments/uneven surfaces  Person educated: Patient Education method: Explanation Education comprehension: verbalized understanding    HOME EXERCISE PROGRAM: Access Code: [email or web address] URL: [email or web address] Date: 04/26/2024 Prepared by: [facility] - Outpatient  Rehab - [facility]  Program Notes perform activities at counter top for safety  Exercises - Sit to Stand Without Arm Support + 5# overhead lift - 1 x daily - 5 x weekly - 2 sets - 10 reps - Standing Hip Abduction with Counter Support 2#  - 1 x daily - 5 x weekly - 2 sets - 10 reps - Narrow Stance with Counter Support  - 1 x daily - 5 x weekly - 2 sets - 30 sec hold - Romberg Stance with Eyes Closed  - 1 x daily - 5 x weekly - 2 sets - 30 sec hold - Corner Balance Feet Together: Eyes Open With Head Turns  - 1 x daily - 7 x weekly - 3 sets - 30 sec hold - Corner Balance Feet Together: Eyes Closed With Head Turns  - 1 x daily - 7 x weekly - 3 sets - 30 sec hold   ------------------------------------------------------- Note: Objective measures were completed at Evaluation unless otherwise noted.  DIAGNOSTIC FINDINGS: none recent  COGNITION: Overall cognitive status: Within functional limits for tasks assessed   SENSATION: Pt reports intact sensation in B LEs   COORDINATION: Alternating pronation/supination: slight dysmetria B Alternating toe tap: intact B Finger to nose: intact B   POSTURE: flexed trunk  and in standing  LOWER EXTREMITY ROM:     Active  Right Eval Left Eval  Hip flexion    Hip extension    Hip abduction    Hip adduction    Hip internal rotation    Hip external rotation    Knee flexion    Knee extension    Ankle dorsiflexion 11 8  Ankle plantarflexion    Ankle inversion    Ankle eversion     (Blank rows = not  tested)  LOWER EXTREMITY MMT:    MMT (in sitting) Right Eval Left Eval  Hip flexion 4 4+  Hip extension    Hip abduction 4+ 4+  Hip adduction 4+ 4+  Hip internal rotation    Hip external rotation    Knee flexion 4 4-  Knee extension 4+ 4+  Ankle dorsiflexion 4+ 4+  Ankle plantarflexion 4 4  Ankle inversion    Ankle eversion    (Blank rows = not tested) GAIT: Findings: Assistive device utilized:None, Level of assistance: SBA, and Comments: R LE crosses midline and causes scissoring; R trunk lean and trunk flexed, narrow BOS, limited arm swing and with arms positioned backwards   FUNCTIONAL TESTS:  GOALS: Goals reviewed with patient? Yes  SHORT TERM GOALS: Target date: 04/24/2024  Patient to be independent with initial HEP. Baseline: HEP initiated; reports non-compliance Goal status: NOT MET    LONG TERM GOALS: Target date: 06/05/2024  Patient to be independent with advanced HEP. Baseline: Not yet initiated  Goal status: IN PROGRESS  Patient to demonstrate B LE strength >/=4+/5.  Baseline: See above; 5/5 Goal status: MET  Patient to score at least 17/24 on DGI in order to decrease risk of falls.  Baseline: 12; 15/24 Goal status: IN PROGRESS 05/08/24  Patient to demonstrate gait speed of at least 2.62 ft/sec in order to improve access to community.   Baseline: 2.2 ft/sec; 2.2 ft/sec Goal status: IN PROGRESS 05/08/24  Patient to demonstrate 5xSTS test in <15 sec with good eccentric control in order to decrease risk of falls.  Baseline: 17 sec; 15 sec Goal status: MET  Patient to report plans to return to chair yoga at least 2x/week.  Baseline: not reporting; not yet returned Goal status: IN PROGRESS   ASSESSMENT:  CLINICAL IMPRESSION: Patient arrived to session without complaints. Initiated with gait training using cane which  improved left hip stability in single limb support but pt reports disinterest in use of AD.  Continued with dynamic standing balance activities to improve single limb support and stepping over obstacles and managing uneven surfaces to facilitate righting reactions.  Static multisensory balance to improve postural stability and awareness for limits of stability w/ considerable unsteadiness with eyes closed or head movement conditions. Ended session with HIIT cardio for improved vascular perfusion and general activity tolerance. Continued sessions to refine program to prepare for D/C to HEP  OBJECTIVE IMPAIRMENTS: Abnormal gait, decreased balance, decreased coordination, decreased strength, postural dysfunction, and pain.   ACTIVITY LIMITATIONS: carrying, lifting, bending, standing, squatting, stairs, transfers, bathing, toileting, dressing, reach over head, hygiene/grooming, and locomotion level  PARTICIPATION LIMITATIONS: meal prep, cleaning, laundry, shopping, community activity, yard work, and church  PERSONAL FACTORS: Age, Fitness, Past/current experiences, Time since onset of injury/illness/exacerbation, and 3+ comorbidities: Anemia, ankle fx, depression, HTN, peripheral neuropathy, L ankle ORIF,  are also affecting patient's functional outcome.   REHAB POTENTIAL: Good  CLINICAL DECISION MAKING: Evolving/moderate complexity  EVALUATION COMPLEXITY: Moderate  PLAN:  PT FREQUENCY: 2x/week  PT DURATION: 4 weeks  PLANNED INTERVENTIONS: 97164- PT Re-evaluation, 97750- Physical Performance Testing, 97110-Therapeutic exercises, 97530- Therapeutic activity, [id number]- Neuromuscular re-education, 97535- Self Care, 02859- Manual therapy, [id number]- Gait training, [phone]- Canalith repositioning, H9716- Electrical stimulation (unattended), 20560 (1-2 muscles), 20561 (3+ muscles)- Dry Needling, Patient/Family education, Balance training, Stair training, Taping, Joint mobilization, Spinal mobilization, Vestibular  training, DME instructions, Cryotherapy, and Moist heat  PLAN FOR NEXT SESSION:  Review HEP and progress with dynamic balance exercises and functional strengthening   1:53 PM, 05/17/24 M. Kelly Kassidi Elza, PT, DPT Physical Therapist- [facility] Office Number: [phone]

## 2024-05-24 ENCOUNTER — Ambulatory Visit

## 2024-05-24 DIAGNOSIS — R2681 Unsteadiness on feet: Secondary | ICD-10-CM

## 2024-05-24 DIAGNOSIS — M6281 Muscle weakness (generalized): Secondary | ICD-10-CM

## 2024-05-24 DIAGNOSIS — R2689 Other abnormalities of gait and mobility: Secondary | ICD-10-CM

## 2024-05-24 DIAGNOSIS — R293 Abnormal posture: Secondary | ICD-10-CM

## 2024-05-24 NOTE — Therapy (Signed)
 OUTPATIENT PHYSICAL THERAPY NEURO TREATMENT NOTE   Patient Name: Krista Carlson MRN: [id number] DOB:17-Mar-1932, 89 y.o., female Today's Date: 05/24/2024   PCP: Shayne Anes, MD  REFERRING PROVIDER: Onita Duos, MD  END OF SESSION:  PT End of Session - 05/24/24 1312     Visit Number 7    Number of Visits 9    Date for PT Re-Evaluation 06/05/24    Authorization Type UHC Medicare & AARP    PT Start Time 1315    PT Stop Time 1400    PT Time Calculation (min) 45 min    Equipment Utilized During Treatment Gait belt    Activity Tolerance Patient tolerated treatment well    Behavior During Therapy [facility] for tasks assessed/performed            Past Medical History:  Diagnosis Date   Anemia    Ankle fracture 2022   left   Aortic heart murmur    Arthritis    Celiac disease    Dr Vicci   Depression    Diverticulitis of colon    Glaucoma    Glaucoma 2012   Heart murmur    History of colonic polyps    3 mm, Dr Vicci   Hypertension    Hypothyroidism    Kidney stone    stone in left kidney now   Osteopenia    Peripheral neuropathy    Spinal stenosis    Past Surgical History:  Procedure Laterality Date   APPENDECTOMY  1966   @ tubal   BREAST CYST ASPIRATION Left 1983   CATARACT EXTRACTION Bilateral yrs ago   COLON BIOPSY  1998   Celiac Sprue; Dr Vicci   COLONOSCOPY  2010   Tics& polyp   COLONOSCOPY WITH PROPOFOL  N/A 06/25/2014   Procedure: COLONOSCOPY WITH PROPOFOL ;  Surgeon: Gladis MARLA Vicci, MD;  Location: [facility];  Service: Endoscopy;  Laterality: N/A;   ESOPHAGOGASTRODUODENOSCOPY (EGD) WITH PROPOFOL  N/A 06/25/2014   Procedure: ESOPHAGOGASTRODUODENOSCOPY (EGD) WITH PROPOFOL ;  Surgeon: Gladis MARLA Vicci, MD;  Location: [facility];  Service: Endoscopy;  Laterality: N/A;   HEMORRHOID SURGERY     HERNIA REPAIR  1943   Right Side   INGUINAL HERNIA REPAIR  11/16/2011   Procedure: HERNIA REPAIR INGUINAL ADULT;  Surgeon: Morene ONEIDA Olives, MD;  Location: [facility];  Service: General;  Laterality: Left;  with mesh   KNEE ARTHROSCOPY  2007   Left   NOSE SURGERY  2007   Fractured Nose Reset   orif broken ankle Left 2013   POLYPECTOMY  2010   due 2015, 3 mm   TONSILLECTOMY AND ADENOIDECTOMY  1941   TUBAL LIGATION  1966   Patient Active Problem List   Diagnosis Date Noted   Gait abnormality 03/06/2024   Insomnia 07/29/2022   Small fiber neuropathy 07/29/2022   Restless leg syndrome 12/17/2020   Restless leg 01/18/2017   Paresthesia 11/15/2016   Neck pain 11/15/2016   Cough 07/28/2013   Bilateral dry eyes 06/01/2013   Seroma complicating a procedure, left groin after LIH repair 11/30/2011   Personal history of renal calculi 03/01/2011   Peripheral neuropathy 02/08/2011   Hypothyroidism 09/18/2009   History of colonic polyps 09/08/2009   SPINAL STENOSIS OF LUMBAR REGION 08/09/2008   DIVERTICULOSIS, COLON 07/31/2008   LUMBAR RADICULOPATHY 07/31/2008   ARTHRALGIA 04/29/2008   HYPERLIPIDEMIA 12/01/2007   ANXIETY STATE NOS 06/08/2007   HYPERTENSION, ESSENTIAL NOS 06/08/2007   Vitamin B12 deficiency 01/26/2007   GLUTEN ENTEROPATHY  01/26/2007   OSTEOPENIA 01/26/2007   CARDIAC MURMUR, AORTIC 01/26/2007    ONSET DATE: many years  REFERRING DIAG: G62.9 (ICD-10-CM) - Small fiber neuropathy G47.00 (ICD-10-CM) - Insomnia, unspecified type G25.81 (ICD-10-CM) - Restless leg syndrome R26.9 (ICD-10-CM) - Gait abnormality  THERAPY DIAG:  Unsteadiness on feet  Other abnormalities of gait and mobility  Muscle weakness (generalized)  Abnormal posture  Rationale for Evaluation and Treatment: Rehabilitation  SUBJECTIVE:                                                                                                                                                                                             SUBJECTIVE STATEMENT: Haven't been up to much, not yet returned chair yoga  Pt accompanied by: self  PERTINENT HISTORY: Anemia,  ankle fx, depression, HTN, peripheral neuropathy, L ankle ORIF  PAIN: Yes: NPRS scale: 5/10 Pain location: B lower legs  Pain description: throbbing, cold, hot  Aggravating factors: night time Relieving factors: moving the legs   PRECAUTIONS: Fall  RED FLAGS: None   WEIGHT BEARING RESTRICTIONS: No  FALLS: Has patient fallen in last 6 months? Yes. Number of falls 1  LIVING ENVIRONMENT: Lives with: lives alone Lives in: House/apartment Stairs: 1- step to enter; 1 step into den, 1 story home Has following equipment at home: None  PLOF: Independent, enjoys playing bridge  PATIENT GOALS: my goal would be for my legs to stop hurting  OBJECTIVE:   TODAY'S TREATMENT: 05/24/24 Activity Comments  Education re: whole body vibration for decreased pain with peripheral neuropathy   Ankle pumps x 2 min Feet on rockerboard seated  Heel-toe raise on airex pad 2x10 UE support  Multisensory static balance On airex pad, CGA throughout  Tandem forwards/backwards x 2 min Light UE support  High step 6 hurdles x 2 min Light UE support  Sidestepping x 2 min Red loop knees  Gastroc stretch 2x60 sec W/ slantboard  NU-step x 8 min, level 4 Steady state 60-70 SPM     TODAY'S TREATMENT: 05/17/24 Activity Comments  Gait training Trials w/ cane I think I do better without it.  Improved stance control RLE w/ cane in left hand  4-square step -canes -canes + 6 hurdles + airex pad CGA throughout  High step hurdles CGA for single limb support  Dynamic standing balance -sidestep x 60 sec UE support -tandem walk x 60 sec UE support -   Multisensory static balance -increased sway on compliant surfaces or eyes closed or with head movements -rocker board x 60 sec ea direction  NU-step level 3 x 8 min 2 min warm-up 30 sec speed  interval; 60 sec recovery                PATIENT EDUCATION: Education details: counseled on habit-matching in order to increase motivation to perform HEP,  discussed benefits of cane in crowded environments/uneven surfaces  Person educated: Patient Education method: Explanation Education comprehension: verbalized understanding    HOME EXERCISE PROGRAM: Access Code: [email or web address] URL: [email or web address] Date: 04/26/2024 Prepared by: [facility] - Outpatient  Rehab - [facility]  Program Notes perform activities at counter top for safety  Exercises - Sit to Stand Without Arm Support + 5# overhead lift - 1 x daily - 5 x weekly - 2 sets - 10 reps - Standing Hip Abduction with Counter Support 2#  - 1 x daily - 5 x weekly - 2 sets - 10 reps - Narrow Stance with Counter Support  - 1 x daily - 5 x weekly - 2 sets - 30 sec hold - Romberg Stance with Eyes Closed  - 1 x daily - 5 x weekly - 2 sets - 30 sec hold - Corner Balance Feet Together: Eyes Open With Head Turns  - 1 x daily - 7 x weekly - 3 sets - 30 sec hold - Corner Balance Feet Together: Eyes Closed With Head Turns  - 1 x daily - 7 x weekly - 3 sets - 30 sec hold - Side Stepping with Resistance at Thighs and Counter Support  - 1 x daily - 7 x weekly - 1-3 sets - 1-2 min rounds hold   ------------------------------------------------------- Note: Objective measures were completed at Evaluation unless otherwise noted.  DIAGNOSTIC FINDINGS: none recent  COGNITION: Overall cognitive status: Within functional limits for tasks assessed   SENSATION: Pt reports intact sensation in B LEs   COORDINATION: Alternating pronation/supination: slight dysmetria B Alternating toe tap: intact B Finger to nose: intact B   POSTURE: flexed trunk  and in standing  LOWER EXTREMITY ROM:     Active  Right Eval Left Eval  Hip flexion    Hip extension    Hip abduction    Hip adduction    Hip internal rotation    Hip external rotation    Knee flexion    Knee extension    Ankle dorsiflexion 11 8  Ankle plantarflexion    Ankle inversion    Ankle eversion     (Blank rows = not  tested)  LOWER EXTREMITY MMT:    MMT (in sitting) Right Eval Left Eval  Hip flexion 4 4+  Hip extension    Hip abduction 4+ 4+  Hip adduction 4+ 4+  Hip internal rotation    Hip external rotation    Knee flexion 4 4-  Knee extension 4+ 4+  Ankle dorsiflexion 4+ 4+  Ankle plantarflexion 4 4  Ankle inversion    Ankle eversion    (Blank rows = not tested) GAIT: Findings: Assistive device utilized:None, Level of assistance: SBA, and Comments: R LE crosses midline and causes scissoring; R trunk lean and trunk flexed, narrow BOS, limited arm swing and with arms positioned backwards   FUNCTIONAL TESTS:  GOALS: Goals reviewed with patient? Yes  SHORT TERM GOALS: Target date: 04/24/2024  Patient to be independent with initial HEP. Baseline: HEP initiated; reports non-compliance Goal status: NOT MET    LONG TERM GOALS: Target date: 06/05/2024  Patient to be independent with advanced HEP. Baseline: Not yet initiated  Goal status: IN PROGRESS  Patient to demonstrate B LE strength >/=4+/5.  Baseline: See above; 5/5 Goal status: MET  Patient to score at least 17/24 on DGI in order to decrease risk of falls.  Baseline: 12; 15/24 Goal status: IN PROGRESS 05/08/24  Patient to demonstrate gait speed of at least 2.62 ft/sec in order to improve access to community.   Baseline: 2.2 ft/sec; 2.2 ft/sec Goal status: IN PROGRESS 05/08/24  Patient to demonstrate 5xSTS test in <15 sec with good eccentric control in order to decrease risk of falls.  Baseline: 17 sec; 15 sec Goal status: MET  Patient to report plans to return to chair yoga at least 2x/week.  Baseline: not reporting; not yet returned Goal status: IN PROGRESS   ASSESSMENT:  CLINICAL IMPRESSION: Education regarding vibration for decreased peripheral nerve pain. Standing balance activities  to improve dynamic balance for single limb support for negotiation of obstacles and improving lateral hip strength to accomplish this. Static multisensory balance challenges to improve postural stability and facilitate reactive balance strategies w/ one instance of retro-LOB and CGA throughout due to unsteadiness. Stretching to improve ankle dorsiflexion for foot clearance ending with steady-state cardiovascular exercise for improved endurance/circulation. Continued sessions to refine HEP in preparation for upcoming D/C.  OBJECTIVE IMPAIRMENTS: Abnormal gait, decreased balance, decreased coordination, decreased strength, postural dysfunction, and pain.   ACTIVITY LIMITATIONS: carrying, lifting, bending, standing, squatting, stairs, transfers, bathing, toileting, dressing, reach over head, hygiene/grooming, and locomotion level  PARTICIPATION LIMITATIONS: meal prep, cleaning, laundry, shopping, community activity, yard work, and church  PERSONAL FACTORS: Age, Fitness, Past/current experiences, Time since onset of injury/illness/exacerbation, and 3+ comorbidities: Anemia, ankle fx, depression, HTN, peripheral neuropathy, L ankle ORIF,  are also affecting patient's functional outcome.   REHAB POTENTIAL: Good  CLINICAL DECISION MAKING: Evolving/moderate complexity  EVALUATION COMPLEXITY: Moderate  PLAN:  PT FREQUENCY: 2x/week  PT DURATION: 4 weeks  PLANNED INTERVENTIONS: 97164- PT Re-evaluation, 97750- Physical Performance Testing, 97110-Therapeutic exercises, 97530- Therapeutic activity, [id number]- Neuromuscular re-education, 97535- Self Care, 02859- Manual therapy, [id number]- Gait training, [phone]- Canalith repositioning, H9716- Electrical stimulation (unattended), 20560 (1-2 muscles), 20561 (3+ muscles)- Dry Needling, Patient/Family education, Balance training, Stair training, Taping, Joint mobilization, Spinal mobilization, Vestibular training, DME instructions, Cryotherapy, and Moist heat  PLAN FOR  NEXT SESSION:  Review HEP and progress with dynamic balance exercises and functional strengthening. Gastroc stretching   1:13 PM, 05/24/24 M. Kelly Raphael Fitzpatrick, PT, DPT Physical Therapist-  Office Number: [phone]

## 2024-06-07 ENCOUNTER — Ambulatory Visit: Attending: Neurology

## 2024-06-07 DIAGNOSIS — R2689 Other abnormalities of gait and mobility: Secondary | ICD-10-CM | POA: Insufficient documentation

## 2024-06-07 DIAGNOSIS — R293 Abnormal posture: Secondary | ICD-10-CM | POA: Diagnosis present

## 2024-06-07 DIAGNOSIS — R2681 Unsteadiness on feet: Secondary | ICD-10-CM | POA: Insufficient documentation

## 2024-06-07 DIAGNOSIS — M6281 Muscle weakness (generalized): Secondary | ICD-10-CM | POA: Insufficient documentation

## 2024-06-07 NOTE — Therapy (Signed)
 OUTPATIENT PHYSICAL THERAPY NEURO TREATMENT NOTE, Recertification, and D/C Summary   Patient Name: Krista Carlson MRN: [id number] DOB:Jun 06, 1932, 89 y.o., female Today's Date: 06/07/2024   PCP: Shayne Anes, MD  REFERRING PROVIDER: Onita Duos, MD  PHYSICAL THERAPY DISCHARGE SUMMARY  Visits from Start of Care: 8  Current functional level related to goals / functional outcomes: See below for outcome measures.   Remaining deficits: High risk for falls per DGI.  Ongoing pain issues from neuropathy   Education / Equipment: HEP and pain mgmt strategies for LE neuropathy   Patient agrees to discharge. Patient goals were partially met. Patient is being discharged due to being pleased with the current functional level.   END OF SESSION:  PT End of Session - 06/07/24 1307     Visit Number 8    Number of Visits 9    Date for PT Re-Evaluation 06/05/24    Authorization Type [employer] Medicare & [employer]    PT Start Time 1315    PT Stop Time 1400    PT Time Calculation (min) 45 min    Equipment Utilized During Treatment Gait belt    Activity Tolerance Patient tolerated treatment well    Behavior During Therapy WFL for tasks assessed/performed            Past Medical History:  Diagnosis Date   Anemia    Ankle fracture 2022   left   Aortic heart murmur    Arthritis    Celiac disease    Dr Vicci   Depression    Diverticulitis of colon    Glaucoma    Glaucoma 2012   Heart murmur    History of colonic polyps    3 mm, Dr Vicci   Hypertension    Hypothyroidism    Kidney stone    stone in left kidney now   Osteopenia    Peripheral neuropathy    Spinal stenosis    Past Surgical History:  Procedure Laterality Date   APPENDECTOMY  1966   @ tubal   BREAST CYST ASPIRATION Left 1983   CATARACT EXTRACTION Bilateral yrs ago   COLON BIOPSY  1998   Celiac Sprue; Dr Vicci   COLONOSCOPY  2010   Tics& polyp   COLONOSCOPY WITH PROPOFOL  N/A 06/25/2014   Procedure: COLONOSCOPY WITH  PROPOFOL ;  Surgeon: Gladis MARLA Vicci, MD;  Location: [facility];  Service: Endoscopy;  Laterality: N/A;   ESOPHAGOGASTRODUODENOSCOPY (EGD) WITH PROPOFOL  N/A 06/25/2014   Procedure: ESOPHAGOGASTRODUODENOSCOPY (EGD) WITH PROPOFOL ;  Surgeon: Gladis MARLA Vicci, MD;  Location: [facility];  Service: Endoscopy;  Laterality: N/A;   HEMORRHOID SURGERY     HERNIA REPAIR  1943   Right Side   INGUINAL HERNIA REPAIR  11/16/2011   Procedure: HERNIA REPAIR INGUINAL ADULT;  Surgeon: Morene ONEIDA Olives, MD;  Location: [facility];  Service: General;  Laterality: Left;  with mesh   KNEE ARTHROSCOPY  2007   Left   NOSE SURGERY  2007   Fractured Nose Reset   orif broken ankle Left 2013   POLYPECTOMY  2010   due 2015, 3 mm   TONSILLECTOMY AND ADENOIDECTOMY  1941   TUBAL LIGATION  1966   Patient Active Problem List   Diagnosis Date Noted   Gait abnormality 03/06/2024   Insomnia 07/29/2022   Small fiber neuropathy 07/29/2022   Restless leg syndrome 12/17/2020   Restless leg 01/18/2017   Paresthesia 11/15/2016   Neck pain 11/15/2016   Cough 07/28/2013   Bilateral dry eyes  06/01/2013   Seroma complicating a procedure, left groin after LIH repair 11/30/2011   Personal history of renal calculi 03/01/2011   Peripheral neuropathy 02/08/2011   Hypothyroidism 09/18/2009   History of colonic polyps 09/08/2009   SPINAL STENOSIS OF LUMBAR REGION 08/09/2008   DIVERTICULOSIS, COLON 07/31/2008   LUMBAR RADICULOPATHY 07/31/2008   ARTHRALGIA 04/29/2008   HYPERLIPIDEMIA 12/01/2007   ANXIETY STATE NOS 06/08/2007   HYPERTENSION, ESSENTIAL NOS 06/08/2007   Vitamin B12 deficiency 01/26/2007   GLUTEN ENTEROPATHY 01/26/2007   OSTEOPENIA 01/26/2007   CARDIAC MURMUR, AORTIC 01/26/2007    ONSET DATE: many years  REFERRING DIAG: G62.9 (ICD-10-CM) - Small fiber neuropathy G47.00 (ICD-10-CM) - Insomnia, unspecified type G25.81 (ICD-10-CM) - Restless leg syndrome R26.9 (ICD-10-CM) - Gait abnormality  THERAPY DIAG:   Unsteadiness on feet  Other abnormalities of gait and mobility  Muscle weakness (generalized)  Abnormal posture  Rationale for Evaluation and Treatment: Rehabilitation  SUBJECTIVE:                                                                                                                                                                                             SUBJECTIVE STATEMENT: Not yet back to yoga  Pt accompanied by: self  PERTINENT HISTORY: Anemia, ankle fx, depression, HTN, peripheral neuropathy, L ankle ORIF  PAIN: Yes: NPRS scale: 5/10 Pain location: B lower legs  Pain description: throbbing, cold, hot  Aggravating factors: night time Relieving factors: moving the legs   PRECAUTIONS: Fall  RED FLAGS: None   WEIGHT BEARING RESTRICTIONS: No  FALLS: Has patient fallen in last 6 months? Yes. Number of falls 1  LIVING ENVIRONMENT: Lives with: lives alone Lives in: House/apartment Stairs: 1- step to enter; 1 step into den, 1 story home Has following equipment at home: None  PLOF: Independent, enjoys playing bridge  PATIENT GOALS: my goal would be for my legs to stop hurting  OBJECTIVE:    TODAY'S TREATMENT: 06/07/24 Activity Comments  LTG performance/review   HEP review Revised and updated with new copy  Static multisensory balance   Gastroc stretch 2x60 sec            TODAY'S TREATMENT: 05/24/24 Activity Comments  Education re: whole body vibration for decreased pain with peripheral neuropathy   Ankle pumps x 2 min Feet on rockerboard seated  Heel-toe raise on airex pad 2x10 UE support  Multisensory static balance On airex pad, CGA throughout  Tandem forwards/backwards x 2 min Light UE support  High step 6 hurdles x 2 min Light UE support  Sidestepping x 2 min Red loop knees  Gastroc stretch 2x60 sec W/ slantboard  NU-step x 8 min, level 4 Steady state 60-70 SPM          PATIENT EDUCATION: Education details: counseled on  habit-matching in order to increase motivation to perform HEP, discussed benefits of cane in crowded environments/uneven surfaces  Person educated: Patient Education method: Explanation Education comprehension: verbalized understanding    HOME EXERCISE PROGRAM: Access Code: [email or web address] URL: [email or web address] Date: 04/26/2024 Prepared by: [facility]  Program Notes perform activities at counter top for safety  Exercises - Sit to Stand Without Arm Support + 5# overhead lift - 1 x daily - 5 x weekly - 2 sets - 10 reps - Standing Hip Abduction with Counter Support 2#  - 1 x daily - 5 x weekly - 2 sets - 10 reps - Narrow Stance with Counter Support  - 1 x daily - 5 x weekly - 2 sets - 30 sec hold - Romberg Stance with Eyes Closed  - 1 x daily - 5 x weekly - 2 sets - 30 sec hold - Corner Balance Feet Together: Eyes Open With Head Turns  - 1 x daily - 7 x weekly - 3 sets - 30 sec hold - Corner Balance Feet Together: Eyes Closed With Head Turns  - 1 x daily - 7 x weekly - 3 sets - 30 sec hold - Side Stepping with Resistance at Thighs and Counter Support  - 1 x daily - 7 x weekly - 1-3 sets - 1-2 min rounds hold   ------------------------------------------------------- Note: Objective measures were completed at Evaluation unless otherwise noted.  DIAGNOSTIC FINDINGS: none recent  COGNITION: Overall cognitive status: Within functional limits for tasks assessed   SENSATION: Pt reports intact sensation in B LEs   COORDINATION: Alternating pronation/supination: slight dysmetria B Alternating toe tap: intact B Finger to nose: intact B   POSTURE: flexed trunk  and in standing  LOWER EXTREMITY ROM:     Active  Right Eval Left Eval  Hip flexion    Hip extension    Hip abduction    Hip adduction    Hip internal rotation    Hip external rotation    Knee flexion    Knee extension    Ankle dorsiflexion 11 8  Ankle  plantarflexion    Ankle inversion    Ankle eversion     (Blank rows = not tested)  LOWER EXTREMITY MMT:    MMT (in sitting) Right Eval Left Eval  Hip flexion 4 4+  Hip extension    Hip abduction 4+ 4+  Hip adduction 4+ 4+  Hip internal rotation    Hip external rotation    Knee flexion 4 4-  Knee extension 4+ 4+  Ankle dorsiflexion 4+ 4+  Ankle plantarflexion 4 4  Ankle inversion    Ankle eversion    (Blank rows = not tested) GAIT: Findings: Assistive device utilized:None, Level of assistance: SBA, and Comments: R LE crosses midline and causes scissoring; R trunk lean and trunk flexed, narrow BOS, limited arm swing and with arms positioned backwards   FUNCTIONAL TESTS:  GOALS: Goals reviewed with patient? Yes  SHORT TERM GOALS: Target date: 04/24/2024  Patient to be independent with initial HEP. Baseline: HEP initiated; reports non-compliance Goal status: NOT MET    LONG TERM GOALS: Target date: 06/05/2024  Patient to be independent with advanced HEP. Baseline: Not yet initiated  Goal status: MET  Patient to demonstrate B LE strength >/=4+/5.  Baseline: See above; 5/5 Goal status: MET  Patient to score at least 17/24 on DGI in order to decrease risk of falls.  Baseline: 12; 15/24; 17/24 Goal status: MET 05/08/24  Patient to demonstrate gait speed of at least 2.62 ft/sec in order to improve access to community.   Baseline: 2.2 ft/sec; 2.2 ft/sec; 1.9 ft/sec Goal status: NOT MET   Patient to demonstrate 5xSTS test in <15 sec with good eccentric control in order to decrease risk of falls.  Baseline: 17 sec; 15 sec Goal status: MET  Patient to report plans to return to chair yoga at least 2x/week.  Baseline: not reporting; not yet returned Goal status: NOT MET   ASSESSMENT:  CLINICAL IMPRESSION: POC review w/ improved  performance DGI from initial 12/24 to 17/24 which indicates high risk for falls.  Gait speed remains largely the same. Continuing to have painful neuropathic symptoms to BLE and reinforced education for physical modalties for pain mgmt.  Revised HEP for added resistance challenges with new copy provided.  Pt pleased with current status and intends to return to senior center for activities. D/C to HEP at this time  OBJECTIVE IMPAIRMENTS: Abnormal gait, decreased balance, decreased coordination, decreased strength, postural dysfunction, and pain.   ACTIVITY LIMITATIONS: carrying, lifting, bending, standing, squatting, stairs, transfers, bathing, toileting, dressing, reach over head, hygiene/grooming, and locomotion level  PARTICIPATION LIMITATIONS: meal prep, cleaning, laundry, shopping, community activity, yard work, and church  PERSONAL FACTORS: Age, Fitness, Past/current experiences, Time since onset of injury/illness/exacerbation, and 3+ comorbidities: Anemia, ankle fx, depression, HTN, peripheral neuropathy, L ankle ORIF,  are also affecting patient's functional outcome.   REHAB POTENTIAL: Good  CLINICAL DECISION MAKING: Evolving/moderate complexity  EVALUATION COMPLEXITY: Moderate  PLAN:  PT FREQUENCY: one time visit  PT DURATION:   PLANNED INTERVENTIONS: 97164- PT Re-evaluation, 97750- Physical Performance Testing, 97110-Therapeutic exercises, 97530- Therapeutic activity, [id number]- Neuromuscular re-education, 97535- Self Care, 02859- Manual therapy, [id number]- Gait training, [phone]- Canalith repositioning, H9716- Electrical stimulation (unattended), 20560 (1-2 muscles), 20561 (3+ muscles)- Dry Needling, Patient/Family education, Balance training, Stair training, Taping, Joint mobilization, Spinal mobilization, Vestibular training, DME instructions, Cryotherapy, and Moist heat  PLAN FOR NEXT SESSION:  D/C to HEP   1:08 PM, 06/07/24 M. Kelly Keeley Sussman, PT, DPT Physical Therapist-  [facility] Office Number: [phone]

## 2024-06-14 ENCOUNTER — Ambulatory Visit

## 2024-09-18 ENCOUNTER — Ambulatory Visit: Admitting: Neurology

## 2024-10-01 ENCOUNTER — Other Ambulatory Visit: Payer: Self-pay | Admitting: Neurology

## 2024-10-22 ENCOUNTER — Other Ambulatory Visit (HOSPITAL_COMMUNITY): Payer: Self-pay | Admitting: Internal Medicine

## 2024-10-22 DIAGNOSIS — M810 Age-related osteoporosis without current pathological fracture: Secondary | ICD-10-CM | POA: Insufficient documentation

## 2024-10-23 ENCOUNTER — Telehealth: Payer: Self-pay

## 2024-10-23 NOTE — Telephone Encounter (Addendum)
 Auth Submission: APPROVED Site of care: [facility] Payer: [employer]HC MEDICARE Medication & CPT/J Code(s) submitted: Jubbonti (denosumab -bbdz) [phone] Diagnosis Code:  Route of submission (phone, fax, portal): PORTAL Phone # Fax # Auth type: Buy/Bill PB Units/visits requested: 60MG  Q6MONTHS X 2DOSES Reference number: [id number] Approval from: 10/23/24 to 10/23/25

## 2024-11-22 ENCOUNTER — Other Ambulatory Visit: Payer: Self-pay | Admitting: Neurology

## 2024-11-22 MED ORDER — METANX FC 3-35-2 MG PO CAPS: 1.0000 | ORAL_CAPSULE | Freq: Two times a day (BID) | ORAL | 0 refills | Status: DC

## 2024-11-22 NOTE — Telephone Encounter (Signed)
 Last seen on 03/06/24 No follow up scheduled   Dr.Yan I don't see that it was mentioned for patient to continue Rx.[name]

## 2024-11-22 NOTE — Telephone Encounter (Signed)
 Phone room please call to schedule updated visit.

## 2024-11-22 NOTE — Telephone Encounter (Signed)
 Patient request refill for L-Methylfolate-B6-B12 (METANX FC) 3-35-2 MG CAPS send to [facility]

## 2024-11-23 NOTE — Telephone Encounter (Signed)
 Pt shows to have an appointment on 04-09, please advise if there is an earlier appointment you are wanting offered to pt.

## 2024-11-27 MED ORDER — METANX FC 3-35-2 MG PO CAPS: 1.0000 | ORAL_CAPSULE | Freq: Two times a day (BID) | ORAL | 0 refills | Status: AC

## 2024-11-27 NOTE — Telephone Encounter (Addendum)
 Received fax that Metanx FC 3-35-2 mg is on not available to be ordered by [facility], it appears I sent Rx to incorrect pharmacy.   Rx should have been sent to [facility] pharmacy, Rx sent there as requested below.

## 2024-11-27 NOTE — Addendum Note (Signed)
 Addended by: DOUGLASS DELON CROME on: 11/27/2024 02:16 PM   Modules accepted: Orders

## 2025-02-07 ENCOUNTER — Ambulatory Visit: Admitting: Neurology

## 2025-02-13 ENCOUNTER — Encounter (HOSPITAL_COMMUNITY)
